# Patient Record
Sex: Female | Born: 1950
Health system: Southern US, Community
[De-identification: ages and names within clinical notes are randomized; demographics above are authoritative.]

## PROBLEM LIST (undated history)

## (undated) DIAGNOSIS — I639 Cerebral infarction, unspecified: Secondary | ICD-10-CM

## (undated) DIAGNOSIS — F419 Anxiety disorder, unspecified: Secondary | ICD-10-CM

## (undated) DIAGNOSIS — E119 Type 2 diabetes mellitus without complications: Secondary | ICD-10-CM

## (undated) DIAGNOSIS — E785 Hyperlipidemia, unspecified: Secondary | ICD-10-CM

## (undated) DIAGNOSIS — I10 Essential (primary) hypertension: Secondary | ICD-10-CM

## (undated) HISTORY — DX: Essential (primary) hypertension: I10

## (undated) HISTORY — DX: Anxiety disorder, unspecified: F41.9

## (undated) HISTORY — DX: Cerebral infarction, unspecified: I63.9

## (undated) HISTORY — DX: Hyperlipidemia, unspecified: E78.5

## (undated) HISTORY — PX: TUBAL LIGATION: SHX77

## (undated) HISTORY — DX: Type 2 diabetes mellitus without complications: E11.9

---

## 2009-09-22 ENCOUNTER — Ambulatory Visit: Payer: Self-pay | Admitting: Family Medicine

## 2009-09-22 DIAGNOSIS — I10 Essential (primary) hypertension: Secondary | ICD-10-CM | POA: Insufficient documentation

## 2009-09-22 DIAGNOSIS — E1165 Type 2 diabetes mellitus with hyperglycemia: Secondary | ICD-10-CM | POA: Insufficient documentation

## 2009-09-22 DIAGNOSIS — J019 Acute sinusitis, unspecified: Secondary | ICD-10-CM | POA: Insufficient documentation

## 2010-04-01 NOTE — Assessment & Plan Note (Signed)
Summary: SINUS INFECTION/JBB   Vital Signs:  Patient Profile:   60 Years Old Female CC:      sinus congestion Height:     61.5 inches Weight:      159 pounds BMI:     29.66 O2 Sat:      97 % O2 treatment:    Room Air Pulse rate:   86 / minute Resp:     16 per minute BP sitting:   152 / 87  (left arm)  Pt. in pain?   no  Vitals Entered By: Adella Hare LPN (2009/09/24 11:43 AM)                   Prior Medication List:  No prior medications documented  Current Allergies (reviewed today): ! PCNHistory of Present Illness Chief Complaint: sinus congestion History of Present Illness: sinus congestion with drainage, productive cough, sinus pressure denies fever, chills, or body aches.   Patient has had congestion and coughing for several days.  she has had post nasl drainage going down the back of her throat. She has had bronchiti before.  Current Problems: ACUTE SINUSITIS, UNSPECIFIED (ICD-461.9) HYPERTENSION (ICD-401.9) DIABETES MELLITUS, TYPE II (ICD-250.00)   Current Meds AVANDIA 2 MG TABS (ROSIGLITAZONE MALEATE) one tab by mouth once daily ALTACE 10 MG CAPS (RAMIPRIL) one cap by mouth once daily ZITHROMAX Z-PAK 250 MG  TABS (AZITHROMYCIN) Use as directed MUCINEX D [phone] MG XR12H-TAB (PSEUDOEPHEDRINE-GUAIFENESIN) sig 1 twice a day  REVIEW OF SYSTEMS Constitutional Symptoms       Complains of fever, night sweats, and fatigue.     Denies chills.  Eyes       Denies change in vision, eye pain, eye discharge, and glasses. Ear/Nose/Throat/Mouth       Denies hearing loss/aids, change in hearing, ear pain, and ear discharge.  Respiratory       Complains of productive cough, wheezing, and shortness of breath.      Denies dry cough, asthma, bronchitis, and emphysema/COPD.  Cardiovascular       Denies murmurs and chest pain.    Gastrointestinal       Complains of diarrhea.      Denies stomach pain, nausea/vomiting, and constipation. Genitourniary       Denies  painful urination. Neurological       Denies headaches, numbness, and tngling. Musculoskeletal       Denies muscle pain, joint pain, and joint stiffness.   Psych       Denies anxiety/stress.  Past History:  Family History: Last updated: Sep 24, 2009 mother deceased- htn father deceased- lung cancer twin sister and older sister- htn brother deceased- suicide  Social History: Last updated: 09/24/2009 Employed- full time- custodian Divorced 3 grown children Quit smoking 10 years ago Alcohol use-no Drug use-no Regular exercise-no  Past Medical History: Diabetes mellitus, type II Hypertension  Past Surgical History: Tubal ligation  Family History: Reviewed history and no changes required. mother deceased- htn father deceased- lung cancer twin sister and older sister- htn brother deceased- suicide  Social History: Reviewed history and no changes required. 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 One Tab By  Mouth Once Daily 2)  Altace 10 Mg Caps (Ramipril) .... One Cap By Mouth Once Daily  Allergies (verified): 1)  ! Pcn  Physical Exam General appearance: well developed, well nourished, no acute distress Head: normocephalic, atraumatic Ears: normal, no lesions or deformities Nasal: pale, boggy, swollen nasal turbinates Oral/Pharynx: pharyngeal erythema without exudate, uvula midline without deviation Neck: neck supple,  trachea midline, no masses Chest/Lungs: no rales, wheezes, or rhonchi bilateral, breath sounds equal without effort Heart: regular rate and  rhythm, no murmur Skin: no obvious rashes or  lesions MSE: oriented to time, place, and person Assessment New Problems: ACUTE SINUSITIS, UNSPECIFIED (ICD-461.9) HYPERTENSION (ICD-401.9) DIABETES MELLITUS, TYPE II (ICD-250.00)  sinusistis  Patient Education: Patient and/or caregiver instructed in the following: rest fluids and Tylenol.  Plan New Medications/Changes: MUCINEX D [phone] MG XR12H-TAB (PSEUDOEPHEDRINE-GUAIFENESIN) sig 1 twice a day  #30 x 0, 09/22/2009, Hassan Rowan MD ZITHROMAX Z-PAK 250 MG  TABS (AZITHROMYCIN) Use as directed  #1 x 0, 09/22/2009, Hassan Rowan MD  New Orders: New Patient Level III [phone] Follow Up: Follow up in 2-3 days if no improvement, Follow up on an as needed basis, Follow up with Primary Physician  The patient and/or caregiver has been counseled thoroughly with regard to medications prescribed including dosage, schedule, interactions, rationale for use, and possible side effects and they verbalize understanding.  Diagnoses and expected course of recovery discussed and will return if not improved as expected or if the condition worsens. Patient and/or caregiver verbalized understanding.  Prescriptions: MUCINEX D [phone] MG XR12H-TAB (PSEUDOEPHEDRINE-GUAIFENESIN) sig 1 twice a day  #30 x 0   Entered and Authorized by:   Hassan Rowan MD   Signed by:   Hassan Rowan MD on 09/22/2009   Method used:   Print then Give to Patient   RxID:   [id number] ZITHROMAX Z-PAK 250 MG  TABS (AZITHROMYCIN) Use as directed  #1 x 0   Entered and Authorized by:   Hassan Rowan MD   Signed by:   Hassan Rowan MD on 09/22/2009   Method used:   Print then Give to Patient   RxID:   [id number]   Patient Instructions: 1)  Please schedule a follow-up appointment as needed. 2)  Please schedule an appointment with your primary doctor in 1-2 weeks if not better.  3)  Acute sinusitis symptoms for less than 10 days are not helped by antibiotics.Use warm moist compresses, and over the counter decongestants ( only as  directed). Call if no improvement in 5-7 days, sooner if increasing pain, fever, or new symptoms. 4)  Take your antibiotic as prescribed until ALL of it is gone, but stop if you develop a rash or swelling and contact our office as soon as possible.  Orders Added: 1)  New Patient Level III [13244]

## 2010-09-15 ENCOUNTER — Inpatient Hospital Stay: Payer: Self-pay | Admitting: Internal Medicine

## 2010-09-24 ENCOUNTER — Encounter: Payer: Self-pay | Admitting: Family Medicine

## 2010-10-01 ENCOUNTER — Encounter: Payer: Self-pay | Admitting: Family Medicine

## 2010-10-23 ENCOUNTER — Inpatient Hospital Stay: Payer: Self-pay | Admitting: Vascular Surgery

## 2010-10-27 LAB — PATHOLOGY REPORT

## 2010-11-01 ENCOUNTER — Encounter: Payer: Self-pay | Admitting: Family Medicine

## 2010-11-17 ENCOUNTER — Ambulatory Visit: Payer: Self-pay | Admitting: Internal Medicine

## 2010-12-01 ENCOUNTER — Encounter: Payer: Self-pay | Admitting: Family Medicine

## 2011-01-01 ENCOUNTER — Encounter: Payer: Self-pay | Admitting: Family Medicine

## 2011-01-31 ENCOUNTER — Encounter: Payer: Self-pay | Admitting: Family Medicine

## 2011-03-03 ENCOUNTER — Encounter: Payer: Self-pay | Admitting: Family Medicine

## 2012-08-30 ENCOUNTER — Ambulatory Visit: Payer: Self-pay | Admitting: Adult Health

## 2012-08-30 DIAGNOSIS — I499 Cardiac arrhythmia, unspecified: Secondary | ICD-10-CM

## 2013-08-17 ENCOUNTER — Ambulatory Visit: Payer: Self-pay

## 2013-10-03 ENCOUNTER — Ambulatory Visit: Payer: Self-pay

## 2013-10-03 LAB — HM MAMMOGRAPHY

## 2014-09-10 ENCOUNTER — Other Ambulatory Visit: Payer: Self-pay | Admitting: Unknown Physician Specialty

## 2014-09-10 NOTE — Telephone Encounter (Signed)
Last creatinine checked in [facility]; Rxs approved

## 2014-10-09 DIAGNOSIS — I129 Hypertensive chronic kidney disease with stage 1 through stage 4 chronic kidney disease, or unspecified chronic kidney disease: Secondary | ICD-10-CM | POA: Insufficient documentation

## 2014-10-09 DIAGNOSIS — I639 Cerebral infarction, unspecified: Secondary | ICD-10-CM | POA: Insufficient documentation

## 2014-10-09 DIAGNOSIS — F419 Anxiety disorder, unspecified: Secondary | ICD-10-CM | POA: Insufficient documentation

## 2014-10-09 DIAGNOSIS — F32A Depression, unspecified: Secondary | ICD-10-CM | POA: Insufficient documentation

## 2014-10-09 DIAGNOSIS — F329 Major depressive disorder, single episode, unspecified: Secondary | ICD-10-CM

## 2014-10-09 DIAGNOSIS — E785 Hyperlipidemia, unspecified: Secondary | ICD-10-CM

## 2014-10-10 ENCOUNTER — Encounter: Payer: Self-pay | Admitting: Unknown Physician Specialty

## 2014-10-10 ENCOUNTER — Ambulatory Visit (INDEPENDENT_AMBULATORY_CARE_PROVIDER_SITE_OTHER): Payer: Commercial Managed Care - HMO | Admitting: Unknown Physician Specialty

## 2014-10-10 VITALS — BP 98/64 | HR 94 | Temp 98.5°F | Ht 61.2 in | Wt 136.6 lb

## 2014-10-10 DIAGNOSIS — I10 Essential (primary) hypertension: Secondary | ICD-10-CM

## 2014-10-10 DIAGNOSIS — M5432 Sciatica, left side: Secondary | ICD-10-CM | POA: Diagnosis not present

## 2014-10-10 DIAGNOSIS — E119 Type 2 diabetes mellitus without complications: Secondary | ICD-10-CM | POA: Diagnosis not present

## 2014-10-10 DIAGNOSIS — E785 Hyperlipidemia, unspecified: Secondary | ICD-10-CM

## 2014-10-10 LAB — LIPID PANEL PICCOLO, WAIVED
Chol/HDL Ratio Piccolo,Waive: 3.9 mg/dL
Cholesterol Piccolo, Waived: 156 mg/dL (ref <200)
HDL Chol Piccolo, Waived: 40 mg/dL — ABNORMAL LOW (ref >59)
LDL Chol Calc Piccolo Waived: 71 mg/dL (ref <100)
TRIGLYCERIDES PICCOLO,WAIVED: 221 mg/dL — AB (ref <150)
VLDL CHOL CALC PICCOLO,WAIVE: 44 mg/dL — AB (ref <30)

## 2014-10-10 LAB — BAYER DCA HB A1C WAIVED: HB A1C (BAYER DCA - WAIVED): 6.2 % (ref <7.0)

## 2014-10-10 MED ORDER — GABAPENTIN 400 MG PO CAPS: 400.0000 mg | ORAL_CAPSULE | Freq: Three times a day (TID) | ORAL | Status: DC

## 2014-10-10 NOTE — Assessment & Plan Note (Signed)
Hgb A1C is 6.2%.  Continue present medications 

## 2014-10-10 NOTE — Progress Notes (Signed)
BP 98/64 mmHg  Pulse 94  Temp(Src) 98.5 F (36.9 C)  Ht 5' 1.2" (1.554 m)  Wt 136 lb 9.6 oz (61.961 kg)  BMI 25.66 kg/m2  SpO2 94%  LMP  (LMP Unknown)   Subjective:    Patient ID: Erin Hamilton, female    DOB: Oct 16, 1950, 64 y.o.   MRN: [id number]  HPI: Erin Hamilton is a 64 y.o. female  Chief Complaint  Patient presents with  . Diabetes  . Hypertension  . Hyperlipidemia  . Depression    pt states she quit taking the citalopram  . Immunizations    pt is requesting shingles vaccine   Diabetes She presents for her follow-up diabetic visit. She has type 2 diabetes mellitus. Her disease course has been stable. There are no hypoglycemic associated symptoms. Pertinent negatives for hypoglycemia include no headaches. Pertinent negatives for diabetes include no blurred vision, no chest pain, no fatigue, no polyphagia, no polyuria and no weight loss. There are no hypoglycemic complications. Symptoms are stable. There are no diabetic complications. Her weight is stable. She is following a generally healthy diet. She has not had a previous visit with a dietitian. She participates in exercise intermittently. She monitors blood glucose at home 1-2 x per week. There is no change in her home blood glucose trend. Her breakfast blood glucose range is generally 90-110 mg/dl. Eye exam is not current.  Hypertension This is a chronic problem. The problem is controlled. Pertinent negatives include no blurred vision, chest pain or headaches. There are no compliance problems.  There is no history of chronic renal disease.  Hyperlipidemia This is a chronic problem. The problem is controlled. Exacerbating diseases include liver disease. She has no history of chronic renal disease, diabetes, hypothyroidism, obesity or nephrotic syndrome. There are no known factors aggravating her hyperlipidemia. Pertinent negatives include no chest pain or myalgias. There are no compliance problems.   Depression  This is a chronic ("I don't take that medicine") problem.  The problem has been gradually improving since onset.  Associated symptoms include no decreased concentration, no fatigue, no helplessness, no hopelessness, does not have insomnia, not irritable, no restlessness, no decreased interest, no appetite change, no body aches, no myalgias, no headaches, no indigestion, not sad and no suicidal ideas.     The symptoms are aggravated by nothing.   Pertinent negatives include no hypothyroidism.  Radicular back pain Gabapentin is helping but finds left hip hurts and gets numb.  Would like to increase Gabapentin  Relevant past medical, surgical, family and social history reviewed and updated as indicated. Interim medical history since our last visit reviewed. Allergies and medications reviewed and updated.  Review of Systems  Constitutional: Negative for weight loss, appetite change and fatigue.  Eyes: Negative for blurred vision.  Cardiovascular: Negative for chest pain.  Endocrine: Negative for polyphagia and polyuria.  Musculoskeletal: Negative for myalgias.  Neurological: Negative for headaches.  Psychiatric/Behavioral: Positive for depression. Negative for suicidal ideas and decreased concentration. The patient does not have insomnia.     Per HPI unless specifically indicated above     Objective:    BP 98/64 mmHg  Pulse 94  Temp(Src) 98.5 F (36.9 C)  Ht 5' 1.2" (1.554 m)  Wt 136 lb 9.6 oz (61.961 kg)  BMI 25.66 kg/m2  SpO2 94%  LMP  (LMP Unknown)  Wt Readings from Last 3 Encounters:  10/10/14 136 lb 9.6 oz (61.961 kg)  03/16/14 137 lb (62.143 kg)  09/22/09 159 lb (  72.122 kg)    Physical Exam  Constitutional: She is oriented to person, place, and time. She appears well-developed and well-nourished. She is not irritable. No distress.  HENT:  Head: Normocephalic and atraumatic.  Eyes: Conjunctivae and lids are normal. Right eye exhibits no discharge. Left eye exhibits no  discharge. No scleral icterus.  Cardiovascular: Normal rate, regular rhythm and normal heart sounds.   Pulmonary/Chest: Effort normal. No respiratory distress.  Abdominal: Normal appearance. There is no splenomegaly or hepatomegaly.  Musculoskeletal: Normal range of motion.  Neurological: She is alert and oriented to person, place, and time.  Skin: Skin is intact. No rash noted. No pallor.  Psychiatric: She has a normal mood and affect. Her behavior is normal. Judgment and thought content normal.  Nursing note and vitals reviewed.   Results for orders placed or performed in visit on 10/09/14  HM MAMMOGRAPHY  Result Value Ref Range   HM Mammogram from PP       Assessment & Plan:   Problem List Items Addressed This Visit      Unprioritized   Hyperlipidemia    LDL is 71.  Continue present medications.        Relevant Orders   Lipid Panel Piccolo, Waived   Diabetes mellitus without complication - Primary    Hgb A1C is 6.2.  Continue present medications      Relevant Orders   Bayer DCA Hb A1c Waived   Lipid Panel Piccolo, Waived   Essential hypertension, benign    Stable.  Continue present medications      Relevant Orders   Comprehensive metabolic panel   Lipid Panel Piccolo, Waived   Back pain with left-sided sciatica    Increase Gabapentin 400 mg TID      Relevant Medications   gabapentin (NEURONTIN) 400 MG capsule      Rx written to get a shingles vaccine  Follow up plan: Return in about 6 months (around 04/12/2015).

## 2014-10-10 NOTE — Assessment & Plan Note (Signed)
Increase Gabapentin 400 mg TID

## 2014-10-10 NOTE — Assessment & Plan Note (Signed)
Stable.  Continue present medications 

## 2014-10-10 NOTE — Assessment & Plan Note (Signed)
LDL is 71.  Continue present medications.

## 2014-10-11 LAB — COMPREHENSIVE METABOLIC PANEL
ALT: 12 IU/L (ref 0–32)
AST: 12 IU/L (ref 0–40)
Albumin/Globulin Ratio: 1.3 (ref 1.1–2.5)
Albumin: 3.9 g/dL (ref 3.6–4.8)
Alkaline Phosphatase: 101 IU/L (ref 39–117)
BUN/Creatinine Ratio: 13 (ref 11–26)
BUN: 13 mg/dL (ref 8–27)
Bilirubin Total: 0.3 mg/dL (ref 0.0–1.2)
CHLORIDE: 103 mmol/L (ref 97–108)
CO2: 23 mmol/L (ref 18–29)
Calcium: 9.5 mg/dL (ref 8.7–10.3)
Creatinine, Ser: 0.97 mg/dL (ref 0.57–1.00)
GFR calc Af Amer: 71 mL/min/{1.73_m2} (ref >59)
GFR calc non Af Amer: 62 mL/min/{1.73_m2} (ref >59)
GLUCOSE: 172 mg/dL — AB (ref 65–99)
Globulin, Total: 2.9 g/dL (ref 1.5–4.5)
Potassium: 3.8 mmol/L (ref 3.5–5.2)
SODIUM: 144 mmol/L (ref 134–144)
Total Protein: 6.8 g/dL (ref 6.0–8.5)

## 2014-11-07 ENCOUNTER — Other Ambulatory Visit: Payer: Self-pay | Admitting: Family Medicine

## 2014-11-07 NOTE — Telephone Encounter (Signed)
I am not her primary provider

## 2014-11-07 NOTE — Telephone Encounter (Signed)
Routing to provider  

## 2014-11-07 NOTE — Telephone Encounter (Signed)
Routing to correct provider

## 2014-11-29 ENCOUNTER — Other Ambulatory Visit: Payer: Self-pay | Admitting: Unknown Physician Specialty

## 2014-11-29 ENCOUNTER — Other Ambulatory Visit: Payer: Self-pay | Admitting: Family Medicine

## 2015-01-07 ENCOUNTER — Other Ambulatory Visit: Payer: Self-pay | Admitting: Unknown Physician Specialty

## 2015-03-06 ENCOUNTER — Encounter: Payer: Self-pay | Admitting: Unknown Physician Specialty

## 2015-03-06 ENCOUNTER — Ambulatory Visit (INDEPENDENT_AMBULATORY_CARE_PROVIDER_SITE_OTHER): Payer: Commercial Managed Care - HMO | Admitting: Unknown Physician Specialty

## 2015-03-06 VITALS — BP 137/80 | HR 89 | Temp 98.3°F | Ht 60.6 in | Wt 143.1 lb

## 2015-03-06 DIAGNOSIS — I639 Cerebral infarction, unspecified: Secondary | ICD-10-CM | POA: Diagnosis not present

## 2015-03-06 DIAGNOSIS — Z23 Encounter for immunization: Secondary | ICD-10-CM | POA: Diagnosis not present

## 2015-03-06 NOTE — Assessment & Plan Note (Signed)
Refer to OT for further assessment of your ability to drive Refer to [facility] care management.

## 2015-03-06 NOTE — Patient Instructions (Signed)
We need to refer you to a social worker through [facility] care management to discuss transportation options I need to refer you to occupational therapy to assess your ability to drive.
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
## 2015-03-19 ENCOUNTER — Other Ambulatory Visit: Payer: Self-pay | Admitting: Unknown Physician Specialty

## 2015-03-19 ENCOUNTER — Other Ambulatory Visit: Payer: Self-pay | Admitting: Family Medicine

## 2015-03-22 ENCOUNTER — Other Ambulatory Visit: Payer: Self-pay | Admitting: Unknown Physician Specialty

## 2015-04-17 ENCOUNTER — Encounter: Payer: Commercial Managed Care - HMO | Admitting: Unknown Physician Specialty

## 2015-04-25 ENCOUNTER — Other Ambulatory Visit: Payer: Self-pay | Admitting: *Deleted

## 2015-04-25 DIAGNOSIS — I639 Cerebral infarction, unspecified: Secondary | ICD-10-CM
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
## 2015-04-29 ENCOUNTER — Other Ambulatory Visit: Payer: Self-pay | Admitting: Unknown Physician Specialty

## 2015-05-09 ENCOUNTER — Other Ambulatory Visit: Payer: Self-pay | Admitting: *Deleted

## 2015-05-09 NOTE — Patient Outreach (Signed)
Spoke with pt, f/u on referral from [facility] telephonic RN CM.   HIPPA verified on pt.  Home visit scheduled for 3/16.  Address provided is  [address] [place], [place].     Zara Chess.   [facility] Care Management  [phone]

## 2015-05-16 ENCOUNTER — Other Ambulatory Visit: Payer: Self-pay | Admitting: *Deleted

## 2015-05-16 ENCOUNTER — Encounter: Payer: Self-pay | Admitting: *Deleted

## 2015-05-18 ENCOUNTER — Encounter: Payer: Self-pay | Admitting: *Deleted

## 2015-05-18 NOTE — Patient Outreach (Signed)
[facility]) Care Management   Home visit 05/16/15  Erin Hamilton 11-27-1950 [id number]  Erin Hamilton is an 65 y.o. female  Subjective:  Pt reports lives alone, son and spouse died last year, ex brother in law and step mother check on her.  Pt reports to see eye MD 3/22 and Erin Haddock NP  3/27.  Pt reports gets meals on wheels.  Pt reports left arm/leg weakness from past stroke.  Pt reports time is an issue with her, has to mark down all her appointments.     Objective:   Filed Vitals:   05/16/15 1613  BP: 128/80  Pulse: 83  Resp: 20    ROS  Physical Exam  Current Medications:  Reviewed with pt  Current Outpatient Prescriptions  Medication Sig Dispense Refill  . clopidogrel (PLAVIX) 75 MG tablet TAKE 1 TABLET EVERY DAY 90 tablet 1  . gabapentin (NEURONTIN) 400 MG capsule TAKE 1 CAPSULE (400 MG TOTAL) BY MOUTH 3 (THREE) TIMES DAILY. 270 capsule 3  . lisinopril (PRINIVIL,ZESTRIL) 10 MG tablet TAKE 1 TABLET (10 MG TOTAL) BY MOUTH DAILY. 90 tablet 0  . metFORMIN (GLUCOPHAGE) 500 MG tablet TAKE 1 TABLET TWICE DAILY BEFORE A MEAL 180 tablet 0  . simvastatin (ZOCOR) 20 MG tablet TAKE 1 TABLET EVERY DAY 90 tablet 1  . glucose blood test strip 1 each by Other route daily. Use as instructed     No current facility-administered medications for this visit.    Functional Status:   In your present state of health, do you have any difficulty performing the following activities: 05/16/2015  Hearing? N  Vision? N  Difficulty concentrating or making decisions? Y  Walking or climbing stairs? N  Dressing or bathing? N  Doing errands, shopping? N  Preparing Food and eating ? N  Using the Toilet? N  In the past six months, have you accidently leaked urine? N  Do you have problems with loss of bowel control? N  Managing your Medications? N  Managing your Finances? N  Housekeeping or managing your Housekeeping? N    Fall/Depression Screening:    PHQ 2/9 Scores  05/16/2015  PHQ - 2 Score 1    Assessment:   Lives alone, family close by, checks on pt.  Left hand/arm weakness, walks with a limb.  Receiving meals on wheels.  Pt managing own medications.  Drives herself to MD office visits.   DM-  Pt checked blood sugar during home visit, result was 113.    Plan:  Pt to f/u with Eye MD (routine exam) 3/22            Pt to f/u with Erin Haddock NP 3/27.            Pt to check blood sugars more often, record [facility] calendar provided)            Plan to inform Erin Hamilton of [facility] involvement.             Plan to continue to provide pt with community nurse case management services, next home visit 4/14.    THN CM Care Plan Problem One        Most Recent Value   Care Plan Problem One  Hx of CVA    Role Documenting the Problem One  Care Management Coordinator   Care Plan for Problem One  Active   THN CM Short Term Goal #1 (0-30 days)  Pt would be able to identify  s/s of stroke  in next 30 days    THN CM Short Term Goal #1 Start Date  05/16/15   Interventions for Short Term Goal #1  Reviewed with pt s/s of stroke.      THN CM Care Plan Problem Two        Most Recent Value   Care Plan Problem Two  DM- pt not monitoring sugars    Role Documenting the Problem Two  Care Management [place] for Problem Two  Active   Interventions for Problem Two Long Term Goal   Checked pt's glucometer for date accuracy, discussed with pt checking sugars often.    THN Long Term Goal (31-90) days  Pt would start back monitoring sugars for the  next 31 days    THN Long Term Goal Start Date  05/16/15     Erin Hamilton.   [place] Care Management  [phone]

## 2015-05-21 ENCOUNTER — Other Ambulatory Visit: Payer: Self-pay | Admitting: *Deleted

## 2015-05-21 NOTE — Patient Outreach (Signed)
Called pt to discuss driving evaluation needed.  Pt reports she went to the [facility], told them about her stroke and was told might need a knob on her steering wheel.  Pt states she does not know where to get that.  Pt reports she has not scheduled an appointment for driving evaluation.  Pt reports she is scheduled to see Eye MD tomorrow.  RN CM discussed with pt to call DMV inquire about what is needed for driving evaluation, call her back.   Zara Chess.   [place] Care Management  [phone]

## 2015-05-22 DIAGNOSIS — H524 Presbyopia: Secondary | ICD-10-CM | POA: Diagnosis not present

## 2015-05-22 DIAGNOSIS — Z0100 Encounter for examination of eyes and vision without abnormal findings: Secondary | ICD-10-CM | POA: Diagnosis not present

## 2015-05-22 DIAGNOSIS — H5210 Myopia, unspecified eye: Secondary | ICD-10-CM | POA: Diagnosis not present

## 2015-05-22 LAB — HM DIABETES EYE EXAM

## 2015-05-27 ENCOUNTER — Encounter: Payer: Commercial Managed Care - HMO | Admitting: Unknown Physician Specialty

## 2015-05-29 ENCOUNTER — Other Ambulatory Visit: Payer: Self-pay | Admitting: *Deleted

## 2015-05-29 ENCOUNTER — Telehealth: Payer: Self-pay | Admitting: Unknown Physician Specialty

## 2015-05-29 NOTE — Telephone Encounter (Signed)
Called and left patient a voicemail letting her know what the DMV said.
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
## 2015-05-29 NOTE — Patient Outreach (Signed)
Received a return phone call from pt to voice message left earlier.  RN CM f/u  with pt about driving evaluation- was told steering wheel knob can be purchased at local auto store, reasonable price.  Pt reports she is aware of that and ex husband can put it on, don't think she needs it.   Also discussed with pt  that view in Epic 03/06/15 office visit with [name] NP that she was  given a limited driving allowance for 3 months.  Pt reports she did not realize  There was a time limit.   Pt reports she was told she would need an OT assessment to which RN CM informed her [facility] PT  does not do driving evaluation, reason for [facility] referral.   Pt reports she was suppose to f/u with Malachy Mood NP 3/27, was to talk to her then about her driving but  was late so appointment was cancelled, not to see Malachy Mood NP until 5/5.    Discussed with pt calling MD office, discussed want [place] OT for left hand, see if driving allowance can be extended to which pt said she would do and call RN CM back.    Zara Chess.   [place] Care Management  [phone]

## 2015-05-29 NOTE — Patient Outreach (Signed)
Attempt made to contact pt, f/u on driving evaluation issues.   HIPPA compliant voice message left with contact number.  If no response, will call again.   Zara Chess.   [place] Care Management  [phone]

## 2015-05-29 NOTE — Telephone Encounter (Signed)
Pt came in the office, wants to know if she can have her driving extended by another 3 months. Please call pt to notify. Thanks.

## 2015-05-29 NOTE — Telephone Encounter (Signed)
Please let pt know

## 2015-05-29 NOTE — Telephone Encounter (Signed)
Routing to provider  

## 2015-05-29 NOTE — Telephone Encounter (Signed)
Admin staff contacted the [facility] in [place], [place] stated pt's license is valid with no known restrictions.

## 2015-06-14 ENCOUNTER — Ambulatory Visit: Payer: Self-pay | Admitting: *Deleted

## 2015-06-19 ENCOUNTER — Other Ambulatory Visit: Payer: Self-pay | Admitting: *Deleted

## 2015-06-19 ENCOUNTER — Encounter: Payer: Self-pay | Admitting: *Deleted

## 2015-06-19 NOTE — Patient Outreach (Signed)
[facility]) Care Management   06/19/2015  CAMIKA MARSICO 1950-10-30 [id number]  YOBANA CULLITON is an 65 y.o. female  Subjective:  Pt reports been doing good, compliant with medications.  Pt reports to f/u with [name] NP 5/4.   Pt reports her brother in law is recovering from a stroke, giving her tips to get  Stronger.   Objective:   Filed Vitals:   06/19/15 1042  BP: 140/82  Pulse: 81  Resp: 12    ROS  Physical Exam  Constitutional: She is oriented to person, place, and time. She appears well-developed and well-nourished.  Cardiovascular: Normal rate and regular rhythm.   Respiratory: Effort normal and breath sounds normal.  Musculoskeletal: Normal range of motion.  Neurological: She is alert and oriented to person, place, and time.  Skin: Skin is warm and dry.  Psychiatric: She has a normal mood and affect. Her behavior is normal. Judgment and thought content normal.    Encounter Medications:  Reviewed with pt  Outpatient Encounter Prescriptions as of 06/19/2015  Medication Sig Note  . gabapentin (NEURONTIN) 400 MG capsule TAKE 1 CAPSULE (400 MG TOTAL) BY MOUTH 3 (THREE) TIMES DAILY. 05/16/2015: Pt reports taking once a day   . glucose blood test strip 1 each by Other route daily. Use as instructed   . lisinopril (PRINIVIL,ZESTRIL) 10 MG tablet TAKE 1 TABLET (10 MG TOTAL) BY MOUTH DAILY.   . metFORMIN (GLUCOPHAGE) 500 MG tablet TAKE 1 TABLET TWICE DAILY BEFORE A MEAL   . simvastatin (ZOCOR) 20 MG tablet TAKE 1 TABLET EVERY DAY   . clopidogrel (PLAVIX) 75 MG tablet TAKE 1 TABLET EVERY DAY    No facility-administered encounter medications on file as of 06/19/2015.    Functional Status:   In your present state of health, do you have any difficulty performing the following activities: 05/16/2015  Hearing? N  Vision? N  Difficulty concentrating or making decisions? Y  Walking or climbing stairs? N  Dressing or bathing? N  Doing errands, shopping? N   Preparing Food and eating ? N  Using the Toilet? N  In the past six months, have you accidently leaked urine? N  Do you have problems with loss of bowel control? N  Managing your Medications? N  Managing your Finances? N  Housekeeping or managing your Housekeeping? N    Fall/Depression Screening:    PHQ 2/9 Scores 05/16/2015  PHQ - 2 Score 1    Assessment:   Pleasant 65 year old woman, lives by herself, brother in law calls her daily.                            DM- pt not checking sugars, had pt check today during home visit - result 113 (before a meal)                          HTN- BP today 140/82 (prior to taking medications).  Pt reports had ham during the holidays.   Plan:    As discussed with pt, plan to discharge from RN CM services- some goals met, no further case management needs.              Plan to inform [name] NP of discharge- in basket case closure letter.             As discussed with pt, plan to have case  closure letter mailed to her.              Plan to inform [facility] care management assistant to close case.    THN CM Care Plan Problem One        Most Recent Value   Care Plan Problem One  Hx of CVA    Role Documenting the Problem One  Care Management Coordinator   Care Plan for Problem One  Active   THN CM Short Term Goal #1 (0-30 days)  Pt would be able to identify  s/s of stroke  in next 30 days    THN CM Short Term Goal #1 Start Date  05/16/15   [facility] CM Short Term Goal #1 Met Date  06/19/15 [met- pt was able to name 3 s/s of stroke. ]   Interventions for Short Term Goal #1  Reviewed with pt s/s of stroke.      THN CM Care Plan Problem Two        Most Recent Value   Care Plan Problem Two  DM- pt not monitoring sugars    Role Documenting the Problem Two  Care Management Coordinator   Care Plan for Problem Two  Active   Interventions for Problem Two Long Term Goal   had pt check sugar during home visit- result 116    THN Long Term Goal (31-90) days  Pt  would start back monitoring sugars for the  next 31 days    THN Long Term Goal Start Date  05/16/15   THN Long Term Goal Met Date  -- [not met- pt not checking on a regular basis, today [place] M.   [place] Care Management  [phone]

## 2015-07-05 ENCOUNTER — Ambulatory Visit (INDEPENDENT_AMBULATORY_CARE_PROVIDER_SITE_OTHER): Payer: Commercial Managed Care - HMO | Admitting: Unknown Physician Specialty

## 2015-07-05 ENCOUNTER — Encounter: Payer: Self-pay | Admitting: Unknown Physician Specialty

## 2015-07-05 VITALS — BP 130/76 | HR 86 | Temp 98.3°F | Ht 61.1 in | Wt 141.2 lb

## 2015-07-05 DIAGNOSIS — E2839 Other primary ovarian failure: Secondary | ICD-10-CM | POA: Diagnosis not present

## 2015-07-05 DIAGNOSIS — I10 Essential (primary) hypertension: Secondary | ICD-10-CM

## 2015-07-05 DIAGNOSIS — E785 Hyperlipidemia, unspecified: Secondary | ICD-10-CM

## 2015-07-05 DIAGNOSIS — Z0000 Encounter for general adult medical examination without abnormal findings: Secondary | ICD-10-CM | POA: Diagnosis not present

## 2015-07-05 DIAGNOSIS — I639 Cerebral infarction, unspecified: Secondary | ICD-10-CM | POA: Diagnosis not present

## 2015-07-05 DIAGNOSIS — E119 Type 2 diabetes mellitus without complications: Secondary | ICD-10-CM

## 2015-07-05 DIAGNOSIS — Z23 Encounter for immunization: Secondary | ICD-10-CM

## 2015-07-05 LAB — MICROALBUMIN, URINE WAIVED
CREATININE, URINE WAIVED: 50 mg/dL (ref 10–300)
Microalb, Ur Waived: 30 mg/L — ABNORMAL HIGH (ref 0–19)

## 2015-07-05 LAB — BAYER DCA HB A1C WAIVED: HB A1C (BAYER DCA - WAIVED): 6.9 % (ref <7.0)

## 2015-07-05 NOTE — Patient Instructions (Addendum)
Pneumococcal Conjugate Vaccine (PCV13)  1. Why get vaccinated? Vaccination can protect both children and adults from pneumococcal disease. Pneumococcal disease is caused by bacteria that can spread from person to person through close contact. It can cause ear infections, and it can also lead to more serious infections of the:  Lungs (pneumonia),  Blood (bacteremia), and  Covering of the brain and spinal cord (meningitis). Pneumococcal pneumonia is most common among adults. Pneumococcal meningitis can cause deafness and brain damage, and it kills about 1 child in 10 who get it. Anyone can get pneumococcal disease, but children under 28 years of age and adults 43 years and older, people with certain medical conditions, and cigarette smokers are at the highest risk. Before there was a vaccine, the [place] saw:  more than 700 cases of meningitis,  about 13,000 blood infections,  about 5 million ear infections, and  about 200 deaths in children under 5 each year from pneumococcal disease. Since vaccine became available, severe pneumococcal disease in these children has fallen by 88%. About 18,000 older adults die of pneumococcal disease each year in the [place]. Treatment of pneumococcal infections with penicillin and other drugs is not as effective as it used to be, because some strains of the disease have become resistant to these drugs. This makes prevention of the disease, through vaccination, even more important. 2. PCV13 vaccine Pneumococcal conjugate vaccine (called PCV13) protects against 13 types of pneumococcal bacteria. PCV13 is routinely given to children at 2, 4, 6, and 65-74 months of age. It is also recommended for children and adults 70 to 70 years of age with certain health conditions, and for all adults 64 years of age and older. Your doctor can give you details. 3. Some people should not get this vaccine Anyone who has ever had a life-threatening allergic reaction  to a dose of this vaccine, to an earlier pneumococcal vaccine called PCV7, or to any vaccine containing diphtheria toxoid (for example, DTaP), should not get PCV13. Anyone with a severe allergy to any component of PCV13 should not get the vaccine. Tell your doctor if the person being vaccinated has any severe allergies. If the person scheduled for vaccination is not feeling well, your healthcare provider might decide to reschedule the shot on another day. 4. Risks of a vaccine reaction With any medicine, including vaccines, there is a chance of reactions. These are usually mild and go away on their own, but serious reactions are also possible. Problems reported following PCV13 varied by age and dose in the series. The most common problems reported among children were:  About half became drowsy after the shot, had a temporary loss of appetite, or had redness or tenderness where the shot was given.  About 1 out of 3 had swelling where the shot was given.  About 1 out of 3 had a mild fever, and about 1 in 20 had a fever over 102.55F.  Up to about 8 out of 10 became fussy or irritable. Adults have reported pain, redness, and swelling where the shot was given; also mild fever, fatigue, headache, chills, or muscle pain. Young children who get PCV13 along with inactivated flu vaccine at the same time may be at increased risk for seizures caused by fever. Ask your doctor for more information. Problems that could happen after any vaccine:  People sometimes faint after a medical procedure, including vaccination. Sitting or lying down for about 15 minutes can help prevent fainting, and injuries caused by a fall.  Tell your doctor if you feel dizzy, or have vision changes or ringing in the ears.  Some older children and adults get severe pain in the shoulder and have difficulty moving the arm where a shot was given. This happens very rarely.  Any medication can cause a severe allergic reaction. Such  reactions from a vaccine are very rare, estimated at about 1 in a million doses, and would happen within a few minutes to a few hours after the vaccination. As with any medicine, there is a very small chance of a vaccine causing a serious injury or death. The safety of vaccines is always being monitored. For more information, visit: http://www.aguilar.org/ 5. What if there is a serious reaction? What should I look for?  Look for anything that concerns you, such as signs of a severe allergic reaction, very high fever, or unusual behavior. Signs of a severe allergic reaction can include hives, swelling of the face and throat, difficulty breathing, a fast heartbeat, dizziness, and weakness-usually within a few minutes to a few hours after the vaccination. What should I do?  If you think it is a severe allergic reaction or other emergency that can't wait, call 9-1-1 or get the person to the nearest hospital. Otherwise, call your doctor. Reactions should be reported to the Vaccine Adverse Event Reporting System (VAERS). Your doctor should file this report, or you can do it yourself through the VAERS web site at www.vaers.SamedayNews.es, or by calling (443)867-5815. VAERS does not give medical advice. 6. The National Vaccine Injury Compensation Program The [employer] Vaccine Injury Compensation Program (VICP) is a federal program that was created to compensate people who may have been injured by certain vaccines. Persons who believe they may have been injured by a vaccine can learn about the program and about filing a claim by calling 407 500 7675 or visiting the [place] website at GoldCloset.com.ee. There is a time limit to file a claim for compensation. 7. How can I learn more?  Ask your healthcare provider. He or she can give you the vaccine package insert or suggest other sources of information.  Call your local or state health department.  Contact the Centers for Disease Control and  Prevention (CDC):  Call (682)286-8410 (1-800-CDC-INFO) or  Visit CDC's website at http://hunter.com/ Vaccine Information Statement PCV13 Vaccine (01/04/2014)   This information is not intended to replace advice given to you by your health care provider. Make sure you discuss any questions you have with your health care provider.   Please do call to schedule your bone density the number to schedule one at either [facility] or [facility] is [phone]

## 2015-07-05 NOTE — Progress Notes (Signed)
BP 130/76 mmHg  Pulse 86  Temp(Src) 98.3 F (36.8 C)  Ht 5' 1.1" (1.552 m)  Wt 141 lb 3.2 oz (64.048 kg)  BMI 26.59 kg/m2  SpO2 96%  LMP  (LMP Unknown)   Subjective:    Patient ID: Erin Hamilton, female    DOB: 1950-09-19, 65 y.o.   MRN: [id number]  HPI: Erin Hamilton is a 65 y.o. female  Chief Complaint  Patient presents with  . Medicare Wellness   Social History   Social History  . Marital Status: Married    Spouse Name: N/A  . Number of Children: N/A  . Years of Education: N/A   Occupational History  . Not on file.   Social History Main Topics  . Smoking status: Former Research scientist (life sciences)  . Smokeless tobacco: Never Used  . Alcohol Use: No  . Drug Use: No  . Sexual Activity: No   Other Topics Concern  . Not on file   Social History Narrative   Past Surgical History  Procedure Laterality Date  . Tubal ligation     Past Medical History  Diagnosis Date  . Anxiety   . Diabetes mellitus without complication ([place])   . CVA (cerebral infarction)   . Hypertension   . Hyperlipidemia   . Stroke [facility])    Diabetes:  Using medications without difficulties No hypoglycemic episodes No hyperglycemic episodes Feet problems: none Blood Sugars averaging:not checking  Hypertension:  Using medications without difficulty  Using medication without problems or lightheadedness No chest pain with exertion or shortness of breath No Edema Average home BPs:Not checking  Elevated Cholesterol: Using medications without problems No Muscle aches Exercise: walks a lot  Although pt doesn't appear to always follow instructions, mini-cog was negative  Relevant past medical, surgical, family and social history reviewed and updated as indicated. Interim medical history since our last visit reviewed. Allergies and medications reviewed and updated.  Review of Systems  Constitutional: Negative.   HENT: Negative.   Eyes: Negative.   Respiratory: Negative.   Cardiovascular:  Negative.   Gastrointestinal: Negative.   Endocrine: Negative.   Genitourinary: Negative.   Musculoskeletal: Negative.   Skin: Negative.   Allergic/Immunologic: Negative.   Neurological: Negative.   Hematological: Negative.   Psychiatric/Behavioral: Negative.     Per HPI unless specifically indicated above     Objective:    BP 130/76 mmHg  Pulse 86  Temp(Src) 98.3 F (36.8 C)  Ht 5' 1.1" (1.552 m)  Wt 141 lb 3.2 oz (64.048 kg)  BMI 26.59 kg/m2  SpO2 96%  LMP  (LMP Unknown)  Wt Readings from Last 3 Encounters:  07/05/15 141 lb 3.2 oz (64.048 kg)  06/19/15 138 lb (62.596 kg)  05/16/15 143 lb (64.864 kg)    Physical Exam  Constitutional: She is oriented to person, place, and time. She appears well-developed and well-nourished.  HENT:  Head: Normocephalic and atraumatic.  Eyes: Pupils are equal, round, and reactive to light. Right eye exhibits no discharge. Left eye exhibits no discharge. No scleral icterus.  Neck: Normal range of motion. Neck supple. Carotid bruit is not present. No thyromegaly present.  Cardiovascular: Normal rate, regular rhythm and normal heart sounds.  Exam reveals no gallop and no friction rub.   No murmur heard. Pulmonary/Chest: Effort normal and breath sounds normal. No respiratory distress. She has no wheezes. She has no rales.  Abdominal: Soft. Bowel sounds are normal. There is no tenderness. There is no rebound.  Genitourinary: Vagina normal and  uterus normal. No breast swelling, tenderness or discharge. Cervix exhibits no motion tenderness, no discharge and no friability. Right adnexum displays no mass, no tenderness and no fullness. Left adnexum displays no mass, no tenderness and no fullness.  Musculoskeletal: Normal range of motion.  Lymphadenopathy:    She has no cervical adenopathy.  Neurological: She is alert and oriented to person, place, and time.  Skin: Skin is warm, dry and intact. No rash noted.  Psychiatric: She has a normal mood and  affect. Her speech is normal and behavior is normal. Judgment and thought content normal. Cognition and memory are normal.     Results for orders placed or performed in visit on 05/23/15  HM DIABETES EYE EXAM  Result Value Ref Range   HM Diabetic Eye Exam No Retinopathy No Retinopathy      Assessment & Plan:   Problem List Items Addressed This Visit      Unprioritized   CVA (cerebral infarction)   Diabetes mellitus without complication ([place])   Relevant Orders   Comprehensive metabolic panel   Bayer DCA Hb A1c Waived   Essential hypertension, benign   Relevant Orders   Microalbumin, Urine Waived   Hyperlipidemia   Relevant Orders   Lipid Panel w/o Chol/HDL Ratio    Other Visit Diagnoses    Health care maintenance    -  Primary    Relevant Orders    Hepatitis C antibody    HIV antibody    Cologuard    Pap Lb, rfx HPV ASCU    Need for pneumococcal vaccination        Relevant Orders    Pneumococcal conjugate vaccine 13-valent IM (Completed)    Ovarian failure        Relevant Orders    DG Bone Density        Follow up plan: Return in about 6 months (around 01/05/2016).

## 2015-07-05 NOTE — Assessment & Plan Note (Signed)
Stable, continue present medications.   

## 2015-07-05 NOTE — Assessment & Plan Note (Signed)
Hgb A1C is 6.9.  Continue present medication

## 2015-07-05 NOTE — Assessment & Plan Note (Signed)
Await lipid panel 

## 2015-07-06 LAB — COMPREHENSIVE METABOLIC PANEL
ALBUMIN: 4.3 g/dL (ref 3.6–4.8)
ALK PHOS: 99 IU/L (ref 39–117)
ALT: 14 IU/L (ref 0–32)
AST: 15 IU/L (ref 0–40)
Albumin/Globulin Ratio: 1.7 (ref 1.2–2.2)
BUN/Creatinine Ratio: 12 (ref 12–28)
BUN: 12 mg/dL (ref 8–27)
Bilirubin Total: 0.2 mg/dL (ref 0.0–1.2)
CALCIUM: 9.7 mg/dL (ref 8.7–10.3)
CO2: 22 mmol/L (ref 18–29)
CREATININE: 1.01 mg/dL — AB (ref 0.57–1.00)
Chloride: 103 mmol/L (ref 96–106)
GFR calc Af Amer: 68 mL/min/{1.73_m2} (ref >59)
GFR, EST NON AFRICAN AMERICAN: 59 mL/min/{1.73_m2} — AB (ref >59)
GLOBULIN, TOTAL: 2.5 g/dL (ref 1.5–4.5)
GLUCOSE: 95 mg/dL (ref 65–99)
POTASSIUM: 4.7 mmol/L (ref 3.5–5.2)
Sodium: 143 mmol/L (ref 134–144)
Total Protein: 6.8 g/dL (ref 6.0–8.5)

## 2015-07-06 LAB — HEPATITIS C ANTIBODY

## 2015-07-06 LAB — LIPID PANEL W/O CHOL/HDL RATIO
Cholesterol, Total: 200 mg/dL — ABNORMAL HIGH (ref 100–199)
HDL: 44 mg/dL (ref >39)
LDL CALC: 105 mg/dL — AB (ref 0–99)
Triglycerides: 254 mg/dL — ABNORMAL HIGH (ref 0–149)
VLDL CHOLESTEROL CAL: 51 mg/dL — AB (ref 5–40)

## 2015-07-06 LAB — HIV ANTIBODY (ROUTINE TESTING W REFLEX): HIV Screen 4th Generation wRfx: NONREACTIVE

## 2015-07-09 LAB — PAP LB, RFX HPV ASCU: PAP Smear Comment: 0

## 2015-07-23 ENCOUNTER — Other Ambulatory Visit: Payer: Self-pay | Admitting: Unknown Physician Specialty

## 2015-07-25 DIAGNOSIS — Z1212 Encounter for screening for malignant neoplasm of rectum: Secondary | ICD-10-CM | POA: Diagnosis not present

## 2015-07-25 DIAGNOSIS — Z1211 Encounter for screening for malignant neoplasm of colon: Secondary | ICD-10-CM | POA: Diagnosis not present

## 2015-07-25 LAB — COLOGUARD

## 2015-07-26 LAB — COLOGUARD: Cologuard: NEGATIVE

## 2015-08-12 LAB — COLOGUARD: Cologuard: NEGATIVE

## 2015-09-20 ENCOUNTER — Other Ambulatory Visit: Payer: Self-pay | Admitting: Unknown Physician Specialty

## 2015-11-19 DIAGNOSIS — E119 Type 2 diabetes mellitus without complications: Secondary | ICD-10-CM | POA: Diagnosis not present

## 2015-11-19 DIAGNOSIS — I779 Disorder of arteries and arterioles, unspecified: Secondary | ICD-10-CM | POA: Insufficient documentation

## 2015-11-19 DIAGNOSIS — Z0000 Encounter for general adult medical examination without abnormal findings: Secondary | ICD-10-CM | POA: Insufficient documentation

## 2015-11-19 DIAGNOSIS — Z8673 Personal history of transient ischemic attack (TIA), and cerebral infarction without residual deficits: Secondary | ICD-10-CM | POA: Diagnosis not present

## 2015-11-19 DIAGNOSIS — E782 Mixed hyperlipidemia: Secondary | ICD-10-CM | POA: Diagnosis not present

## 2015-11-19 DIAGNOSIS — Z23 Encounter for immunization: Secondary | ICD-10-CM | POA: Diagnosis not present

## 2015-11-19 DIAGNOSIS — I10 Essential (primary) hypertension: Secondary | ICD-10-CM | POA: Diagnosis not present

## 2016-01-08 ENCOUNTER — Ambulatory Visit: Payer: Commercial Managed Care - HMO | Admitting: Unknown Physician Specialty

## 2016-03-24 DIAGNOSIS — I10 Essential (primary) hypertension: Secondary | ICD-10-CM | POA: Diagnosis not present

## 2016-03-24 DIAGNOSIS — E119 Type 2 diabetes mellitus without complications: Secondary | ICD-10-CM | POA: Diagnosis not present

## 2016-03-24 DIAGNOSIS — I779 Disorder of arteries and arterioles, unspecified: Secondary | ICD-10-CM | POA: Diagnosis not present

## 2016-03-25 DIAGNOSIS — I779 Disorder of arteries and arterioles, unspecified: Secondary | ICD-10-CM | POA: Diagnosis not present

## 2016-03-25 DIAGNOSIS — I10 Essential (primary) hypertension: Secondary | ICD-10-CM | POA: Diagnosis not present

## 2016-03-25 DIAGNOSIS — E119 Type 2 diabetes mellitus without complications: Secondary | ICD-10-CM | POA: Diagnosis not present

## 2016-06-23 ENCOUNTER — Emergency Department (HOSPITAL_COMMUNITY): Payer: Medicare HMO

## 2016-06-23 ENCOUNTER — Encounter (HOSPITAL_COMMUNITY): Payer: Self-pay | Admitting: Radiology

## 2016-06-23 ENCOUNTER — Observation Stay (HOSPITAL_COMMUNITY): Payer: Medicare HMO

## 2016-06-23 ENCOUNTER — Inpatient Hospital Stay (HOSPITAL_COMMUNITY)
Admission: EM | Admit: 2016-06-23 | Discharge: 2016-06-25 | DRG: 065 | Disposition: A | Discharge status: Home Health | Payer: Medicare HMO | Attending: Internal Medicine | Admitting: Internal Medicine

## 2016-06-23 DIAGNOSIS — Z91040 Latex allergy status: Secondary | ICD-10-CM

## 2016-06-23 DIAGNOSIS — R29818 Other symptoms and signs involving the nervous system: Secondary | ICD-10-CM | POA: Diagnosis not present

## 2016-06-23 DIAGNOSIS — I639 Cerebral infarction, unspecified: Secondary | ICD-10-CM | POA: Diagnosis not present

## 2016-06-23 DIAGNOSIS — Z91013 Allergy to seafood: Secondary | ICD-10-CM | POA: Diagnosis not present

## 2016-06-23 DIAGNOSIS — F329 Major depressive disorder, single episode, unspecified: Secondary | ICD-10-CM | POA: Diagnosis not present

## 2016-06-23 DIAGNOSIS — E1165 Type 2 diabetes mellitus with hyperglycemia: Secondary | ICD-10-CM | POA: Diagnosis not present

## 2016-06-23 DIAGNOSIS — Z9889 Other specified postprocedural states: Secondary | ICD-10-CM | POA: Diagnosis not present

## 2016-06-23 DIAGNOSIS — R29810 Facial weakness: Secondary | ICD-10-CM | POA: Diagnosis present

## 2016-06-23 DIAGNOSIS — Z7902 Long term (current) use of antithrombotics/antiplatelets: Secondary | ICD-10-CM | POA: Diagnosis not present

## 2016-06-23 DIAGNOSIS — I63511 Cerebral infarction due to unspecified occlusion or stenosis of right middle cerebral artery: Principal | ICD-10-CM | POA: Diagnosis present

## 2016-06-23 DIAGNOSIS — E785 Hyperlipidemia, unspecified: Secondary | ICD-10-CM | POA: Diagnosis present

## 2016-06-23 DIAGNOSIS — Z8249 Family history of ischemic heart disease and other diseases of the circulatory system: Secondary | ICD-10-CM

## 2016-06-23 DIAGNOSIS — Z7982 Long term (current) use of aspirin: Secondary | ICD-10-CM

## 2016-06-23 DIAGNOSIS — I6789 Other cerebrovascular disease: Secondary | ICD-10-CM | POA: Diagnosis not present

## 2016-06-23 DIAGNOSIS — I69354 Hemiplegia and hemiparesis following cerebral infarction affecting left non-dominant side: Secondary | ICD-10-CM | POA: Diagnosis not present

## 2016-06-23 DIAGNOSIS — Z7984 Long term (current) use of oral hypoglycemic drugs: Secondary | ICD-10-CM

## 2016-06-23 DIAGNOSIS — I129 Hypertensive chronic kidney disease with stage 1 through stage 4 chronic kidney disease, or unspecified chronic kidney disease: Secondary | ICD-10-CM | POA: Diagnosis present

## 2016-06-23 DIAGNOSIS — N182 Chronic kidney disease, stage 2 (mild): Secondary | ICD-10-CM | POA: Diagnosis present

## 2016-06-23 DIAGNOSIS — Z87891 Personal history of nicotine dependence: Secondary | ICD-10-CM | POA: Diagnosis not present

## 2016-06-23 DIAGNOSIS — Z823 Family history of stroke: Secondary | ICD-10-CM | POA: Diagnosis not present

## 2016-06-23 DIAGNOSIS — R471 Dysarthria and anarthria: Secondary | ICD-10-CM | POA: Diagnosis not present

## 2016-06-23 DIAGNOSIS — Z79899 Other long term (current) drug therapy: Secondary | ICD-10-CM

## 2016-06-23 DIAGNOSIS — Z880 Allergy status to penicillin: Secondary | ICD-10-CM | POA: Diagnosis not present

## 2016-06-23 DIAGNOSIS — F419 Anxiety disorder, unspecified: Secondary | ICD-10-CM | POA: Diagnosis present

## 2016-06-23 DIAGNOSIS — E1122 Type 2 diabetes mellitus with diabetic chronic kidney disease: Secondary | ICD-10-CM | POA: Diagnosis present

## 2016-06-23 DIAGNOSIS — R4189 Other symptoms and signs involving cognitive functions and awareness: Secondary | ICD-10-CM | POA: Diagnosis not present

## 2016-06-23 DIAGNOSIS — I6523 Occlusion and stenosis of bilateral carotid arteries: Secondary | ICD-10-CM | POA: Diagnosis not present

## 2016-06-23 DIAGNOSIS — I63233 Cerebral infarction due to unspecified occlusion or stenosis of bilateral carotid arteries: Secondary | ICD-10-CM | POA: Diagnosis not present

## 2016-06-23 DIAGNOSIS — I679 Cerebrovascular disease, unspecified: Secondary | ICD-10-CM | POA: Diagnosis not present

## 2016-06-23 DIAGNOSIS — R4701 Aphasia: Secondary | ICD-10-CM | POA: Diagnosis not present

## 2016-06-23 DIAGNOSIS — I10 Essential (primary) hypertension: Secondary | ICD-10-CM | POA: Diagnosis not present

## 2016-06-23 LAB — DIFFERENTIAL
BASOS ABS: 0 10*3/uL (ref 0.0–0.1)
BASOS PCT: 0 %
Eosinophils Absolute: 0 10*3/uL (ref 0.0–0.7)
Eosinophils Relative: 1 %
Lymphocytes Relative: 41 %
Lymphs Abs: 2.9 10*3/uL (ref 0.7–4.0)
MONO ABS: 0.3 10*3/uL (ref 0.1–1.0)
Monocytes Relative: 4 %
NEUTROS ABS: 3.9 10*3/uL (ref 1.7–7.7)
NEUTROS PCT: 54 %

## 2016-06-23 LAB — I-STAT CHEM 8, ED
BUN: 12 mg/dL (ref 6–20)
CHLORIDE: 106 mmol/L (ref 101–111)
CREATININE: 1 mg/dL (ref 0.44–1.00)
Calcium, Ion: 1.07 mmol/L — ABNORMAL LOW (ref 1.15–1.40)
Glucose, Bld: 144 mg/dL — ABNORMAL HIGH (ref 65–99)
HEMATOCRIT: 46 % (ref 36.0–46.0)
Hemoglobin: 15.6 g/dL — ABNORMAL HIGH (ref 12.0–15.0)
POTASSIUM: 3.6 mmol/L (ref 3.5–5.1)
SODIUM: 141 mmol/L (ref 135–145)
TCO2: 25 mmol/L (ref 0–100)

## 2016-06-23 LAB — COMPREHENSIVE METABOLIC PANEL
ALBUMIN: 4.2 g/dL (ref 3.5–5.0)
ALT: 22 U/L (ref 14–54)
AST: 26 U/L (ref 15–41)
Alkaline Phosphatase: 118 U/L (ref 38–126)
Anion gap: 9 (ref 5–15)
BUN: 10 mg/dL (ref 6–20)
CHLORIDE: 106 mmol/L (ref 101–111)
CO2: 25 mmol/L (ref 22–32)
CREATININE: 0.95 mg/dL (ref 0.44–1.00)
Calcium: 9.4 mg/dL (ref 8.9–10.3)
GFR calc non Af Amer: >60 mL/min (ref >60)
GLUCOSE: 141 mg/dL — AB (ref 65–99)
Potassium: 3.6 mmol/L (ref 3.5–5.1)
SODIUM: 140 mmol/L (ref 135–145)
Total Bilirubin: 0.6 mg/dL (ref 0.3–1.2)
Total Protein: 7.6 g/dL (ref 6.5–8.1)

## 2016-06-23 LAB — CBG MONITORING, ED: Glucose-Capillary: 149 mg/dL — ABNORMAL HIGH (ref 65–99)

## 2016-06-23 LAB — GLUCOSE, CAPILLARY: Glucose-Capillary: 137 mg/dL — ABNORMAL HIGH (ref 65–99)

## 2016-06-23 LAB — CBC
HEMATOCRIT: 45.2 % (ref 36.0–46.0)
HEMOGLOBIN: 14.4 g/dL (ref 12.0–15.0)
MCH: 28.9 pg (ref 26.0–34.0)
MCHC: 31.9 g/dL (ref 30.0–36.0)
MCV: 90.6 fL (ref 78.0–100.0)
Platelets: 228 10*3/uL (ref 150–400)
RBC: 4.99 MIL/uL (ref 3.87–5.11)
RDW: 13.7 % (ref 11.5–15.5)
WBC: 7.2 10*3/uL (ref 4.0–10.5)

## 2016-06-23 LAB — I-STAT TROPONIN, ED: TROPONIN I, POC: 0 ng/mL (ref 0.00–0.08)

## 2016-06-23 LAB — PROTIME-INR
INR: 1
Prothrombin Time: 13.2 seconds (ref 11.4–15.2)

## 2016-06-23 LAB — APTT: aPTT: 32 seconds (ref 24–36)

## 2016-06-23 MED ORDER — ACETAMINOPHEN 325 MG PO TABS: 650.0000 mg | ORAL_TABLET | Freq: Four times a day (QID) | ORAL | Status: DC | PRN

## 2016-06-23 MED ORDER — HEPARIN SODIUM (PORCINE) 5000 UNIT/ML IJ SOLN
5000.0000 [IU] | Freq: Three times a day (TID) | INTRAMUSCULAR | Status: DC
  Administered 2016-06-23 – 2016-06-25 (×5): 5000 [IU] via SUBCUTANEOUS
  Filled 2016-06-23 (×5): qty 1

## 2016-06-23 MED ORDER — CLOPIDOGREL BISULFATE 75 MG PO TABS
75.0000 mg | ORAL_TABLET | Freq: Every day | ORAL | Status: DC
  Administered 2016-06-24 – 2016-06-25 (×2): 75 mg via ORAL
  Filled 2016-06-23 (×2): qty 1

## 2016-06-23 MED ORDER — PROMETHAZINE HCL 25 MG PO TABS
12.5000 mg | ORAL_TABLET | Freq: Four times a day (QID) | ORAL | Status: DC | PRN
Start: 2016-06-23 — End: 2016-06-25

## 2016-06-23 MED ORDER — ACETAMINOPHEN 650 MG RE SUPP: 650.0000 mg | Freq: Four times a day (QID) | RECTAL | Status: DC | PRN

## 2016-06-23 MED ORDER — IOPAMIDOL (ISOVUE-370) INJECTION 76%
75.0000 mL | Freq: Once | INTRAVENOUS | Status: AC | PRN
  Administered 2016-06-23: 75 mL via INTRAVENOUS

## 2016-06-23 MED ORDER — ATORVASTATIN CALCIUM 80 MG PO TABS
80.0000 mg | ORAL_TABLET | Freq: Every day | ORAL | Status: DC
Start: 2016-06-24 — End: 2016-06-25
  Administered 2016-06-24 – 2016-06-25 (×2): 80 mg via ORAL
  Filled 2016-06-23 (×2): qty 1

## 2016-06-23 MED ORDER — SENNOSIDES-DOCUSATE SODIUM 8.6-50 MG PO TABS: 1.0000 | ORAL_TABLET | Freq: Every evening | ORAL | Status: DC | PRN

## 2016-06-23 MED ORDER — SODIUM CHLORIDE 0.9% FLUSH
3.0000 mL | Freq: Two times a day (BID) | INTRAVENOUS | Status: DC
  Administered 2016-06-23 – 2016-06-25 (×4): 3 mL via INTRAVENOUS

## 2016-06-23 MED ORDER — INSULIN ASPART 100 UNIT/ML ~~LOC~~ SOLN
0.0000 [IU] | Freq: Three times a day (TID) | SUBCUTANEOUS | Status: DC
  Administered 2016-06-24: 1 [IU] via SUBCUTANEOUS
  Administered 2016-06-24: 2 [IU] via SUBCUTANEOUS

## 2016-06-23 MED ORDER — GABAPENTIN 400 MG PO CAPS
400.0000 mg | ORAL_CAPSULE | Freq: Two times a day (BID) | ORAL | Status: DC
  Administered 2016-06-23 – 2016-06-24 (×3): 400 mg via ORAL
  Filled 2016-06-23 (×4): qty 1

## 2016-06-23 MED ORDER — STROKE: EARLY STAGES OF RECOVERY BOOK
Freq: Once | Status: AC
  Administered 2016-06-23: 21:00:00
  Filled 2016-06-23: qty 1

## 2016-06-23 NOTE — ED Provider Notes (Signed)
[facility] DEPT Provider Note   CSN: [id number] Arrival date & time: 06/23/16  1551     History   Chief Complaint Chief Complaint  Patient presents with  . Code Stroke    HPI Erin Hamilton is a 66 y.o. female.  The history is provided by the patient and the EMS personnel.  Cerebrovascular Accident  This is a new problem. The current episode started 3 to 5 hours ago. The problem occurs constantly. The problem has been gradually improving. Pertinent negatives include no chest pain, no abdominal pain, no headaches and no shortness of breath. Nothing aggravates the symptoms. Nothing relieves the symptoms. She has tried nothing for the symptoms. The treatment provided no relief.    Past Medical History:  Diagnosis Date  . Anxiety   . CVA (cerebral infarction)   . Diabetes mellitus without complication ([place])   . Hyperlipidemia   . Hypertension   . Stroke [facility])     Patient Active Problem List   Diagnosis Date Noted  . Diabetes mellitus without complication ([place]) 42/70/6237  . Essential hypertension, benign 10/10/2014  . Back pain with left-sided sciatica 10/10/2014  . Acute anxiety 10/09/2014  . CVA (cerebral infarction) 10/09/2014  . Hyperlipidemia 10/09/2014  . Depression 10/09/2014  . Hypertensive CKD (chronic kidney disease) 10/09/2014  . DIABETES MELLITUS, TYPE II 09/22/2009  . ACUTE SINUSITIS, UNSPECIFIED 09/22/2009    Past Surgical History:  Procedure Laterality Date  . TUBAL LIGATION      OB History    No data available       Home Medications    Prior to Admission medications   Medication Sig Start Date End Date Taking? Authorizing Provider  aspirin EC 81 MG tablet Take 81 mg by mouth daily.   Yes Historical Provider, MD  atorvastatin (LIPITOR) 80 MG tablet Take 80 mg by mouth daily.   Yes Historical Provider, MD  clopidogrel (PLAVIX) 75 MG tablet TAKE 1 TABLET EVERY DAY Patient taking differently: Take 75 mg by mouth once a day 09/20/15  Yes  [name], NP  gabapentin (NEURONTIN) 400 MG capsule TAKE 1 CAPSULE (400 MG TOTAL) BY MOUTH 3 (THREE) TIMES DAILY. Patient taking differently: Take 400 mg by mouth two times a day 04/29/15  Yes [name], NP  glucose blood test strip 1 each by Other route daily. Use as instructed   Yes Historical Provider, MD  lisinopril (PRINIVIL,ZESTRIL) 10 MG tablet TAKE 1 TABLET EVERY DAY 07/23/15  Yes [name], NP  metFORMIN (GLUCOPHAGE) 500 MG tablet TAKE 1 TABLET TWICE DAILY BEFORE A MEAL 03/19/15  Yes [name], NP  simvastatin (ZOCOR) 20 MG tablet TAKE 1 TABLET EVERY DAY Patient not taking: Reported on 06/23/2016 09/20/15   [name], NP    Family History Family History  Problem Relation Age of Onset  . Stroke Father   . Heart attack Sister   . Cancer Son     throat and lung  . Cancer Sister     gum    Social History Social History  Substance Use Topics  . Smoking status: Former Research scientist (life sciences)  . Smokeless tobacco: Never Used  . Alcohol use No     Allergies   Oysters [shellfish allergy]; Penicillins; and Latex   Review of Systems Review of Systems  Unable to perform ROS: Acuity of condition  Constitutional: Negative for chills and fever.  HENT: Negative for sore throat.   Eyes: Negative for pain and visual disturbance.  Respiratory: Negative for cough and shortness  of breath.   Cardiovascular: Negative for chest pain.  Gastrointestinal: Negative for abdominal pain and vomiting.  Genitourinary: Negative for dysuria and hematuria.  Musculoskeletal: Negative for back pain.  Skin: Negative for rash.  Neurological: Positive for speech difficulty and numbness. Negative for seizures, syncope, facial asymmetry and headaches.  All other systems reviewed and are negative.    Physical Exam Updated Vital Signs BP (!) 216/102   Pulse 94   Temp 98.8 F (37.1 C) (Oral)   Resp 18   Ht 5\' 4"  (1.626 m)   Wt 67.7 kg   LMP  (LMP Unknown)   SpO2 98%   BMI 25.62 kg/m    Physical Exam  Constitutional: She appears well-developed and well-nourished. No distress.  HENT:  Head: Normocephalic and atraumatic.  Eyes: Conjunctivae and EOM are normal.  Neck: Neck supple.  Cardiovascular: Normal rate and regular rhythm.   No murmur heard. Pulmonary/Chest: Effort normal and breath sounds normal. No respiratory distress.  Abdominal: Soft. There is no tenderness.  Musculoskeletal: She exhibits no edema.  Neurological: She is alert. A sensory deficit (Pt with numbness to RUE) is present. No cranial nerve deficit. GCS eye subscore is 4. GCS verbal subscore is 4. GCS motor subscore is 6.  Pt with significant weakness to LUE and LLE which she says is chronic.  Pt with aphasia on examination.  Skin: Skin is warm and dry.  Psychiatric: She has a normal mood and affect. Her speech is delayed.  Nursing note and vitals reviewed.    ED Treatments / Results  Labs (all labs ordered are listed, but only abnormal results are displayed) Labs Reviewed  COMPREHENSIVE METABOLIC PANEL - Abnormal; Notable for the following:       Result Value   Glucose, Bld 141 (*)    All other components within normal limits  CBG MONITORING, ED - Abnormal; Notable for the following:    Glucose-Capillary 149 (*)    All other components within normal limits  I-STAT CHEM 8, ED - Abnormal; Notable for the following:    Glucose, Bld 144 (*)    Calcium, Ion 1.07 (*)    Hemoglobin 15.6 (*)    All other components within normal limits  PROTIME-INR  APTT  CBC  DIFFERENTIAL  I-STAT TROPOININ, ED    EKG  EKG Interpretation  Date/Time:  Tuesday June 23 2016 16:19:55 EDT Ventricular Rate:  91 PR Interval:    QRS Duration: 81 QT Interval:  367 QTC Calculation: 452 R Axis:   57 Text Interpretation:  Sinus rhythm Low voltage, precordial leads Borderline T abnormalities, inferior leads nonspecific t wave changes compared to prior ECG Confirmed by KNAPP  MD-J, JON ([id number]) on 06/23/2016 4:22:48  PM       Radiology Ct Angio Head W Or Wo Contrast  Result Date: 06/23/2016 CLINICAL DATA:  Acute presentation with aphasia. EXAM: CT ANGIOGRAPHY HEAD AND NECK TECHNIQUE: Multidetector CT imaging of the head and neck was performed using the standard protocol during bolus administration of intravenous contrast. Multiplanar CT image reconstructions and MIPs were obtained to evaluate the vascular anatomy. Carotid stenosis measurements (when applicable) are obtained utilizing NASCET criteria, using the distal internal carotid diameter as the denominator. CONTRAST:  75 cc Isovue 370 COMPARISON:  CT earlier same day.  CT angiography 11/17/2010. FINDINGS: CTA NECK FINDINGS Aortic arch: Atherosclerosis of the arch. Branching pattern of the brachiocephalic vessels is normal. No flow limiting stenosis. Right carotid system: Common carotid artery is widely patent to the bifurcation.  Primarily soft plaque at the carotid bifurcation. Minimal diameter at the distal bulb is 2.5 cm. Compared to a more distal cervical ICA diameter of 5 mm, this is a 50% stenosis. Extensive soft plaque would increase risk of embolic disease. Left carotid system: Common carotid artery is widely patent to the bifurcation region. There is extensive calcified plaque at the carotid bifurcation and proximal ICA. There are serial stenoses, maximal at the distal bulb where the diameter measures 2 mm. Compared to a more distal cervical ICA diameter of 5 mm, this indicates a 60% stenosis. Vertebral arteries: Small right vertebral artery shows severe atherosclerotic disease in the proximal 3 cm with serial stenoses 70% or greater. Beyond that, the vessel is patent to the foramen magnum where it supplies PICA and gives a tiny contribution to the basilar. Left vertebral artery shows soft and calcified plaque proximally with stenosis estimated at 50% and irregularity. Beyond that, the vessel is widely patent through the cervical region where it provides  dominant supply to the basilar. Skeleton: Ordinary degenerative spondylosis. Other neck: No significant soft tissue finding. Upper chest: Negative Review of the MIP images confirms the above findings CTA HEAD FINDINGS Anterior circulation: Both internal carotid arteries are patent through the siphon regions. There is extensive atherosclerotic calcification throughout the siphon regions bilaterally with stenosis estimated at 50% on each side. On the right. Supraclinoid ICA supplies the fairly normal appearing anterior cerebral artery. There is a severely diseased M1 segment with long segment serial stenoses 75% or greater. Diminished vessels are seen within the right middle cerebral artery territory corresponding to the region of old infarction. On the left, the anterior cerebral artery is patent with only mild atherosclerotic narrowing. There are serial stenoses of the MCA at the distal M1 and proximal M2 region. Distal M1 stenosis is 75% or greater. More distal MCA branches show perfusion, without identifiable occluded branch. Distal vessels show some atherosclerotic irregularity. Posterior circulation: Both vertebral arteries are patent to the basilar with the left being dominant. No basilar stenosis. Posterior circulation branch vessels are intact. Distal PCA branches show atherosclerotic irregularity, left more than right. Venous sinuses: Patent and normal Anatomic variants: None significant Delayed phase: No abnormal enhancement Review of the MIP images confirms the above findings IMPRESSION: Extensive atherosclerotic disease at the carotid bifurcation and proximal ICA on the left with primarily calcified plaque. 60% stenosis of the ICA at the distal bulb. 50% stenosis in the carotid siphon region with extensive peripheral calcification. 75% or greater stenosis of the distal M1 segment on the left. Stenoses of proximal M2 branches as well. No definite missing vessel or cut off vessel in the left MCA territory.  Extensive soft plaque affecting the distal right common carotid artery, carotid bifurcation and proximal internal carotid artery. Maximal stenosis at the distal bulb is 50%. Extensive soft plaque would place the patient at risk of embolic disease. Calcification of the carotid siphon region with stenosis of 50%. Serial severe stenoses of the middle cerebral artery M1 M2 regions consistent with previous infarction in that region. Dominant left vertebral artery shows atherosclerotic disease at its origin and in the proximal 2 cm with calcified plaque and irregular narrowing of the lumen. Stenosis is estimated at 50%. Beyond that, the vessel is widely patent to the basilar. Non dominant right vertebral artery shows severe atherosclerotic disease affecting its proximal 3 cm with serial severe stenoses, 70% or greater. Beyond that, the vessel is patent supplying right PICA and contributing to basilar. Findings were discussed  in person with Dr. Leonel Ramsay at approximately 4:15 p.m. Electronically Signed   By: Erin Chimes M.D.   On: 06/23/2016 16:34   Ct Angio Neck W Or Wo Contrast  Result Date: 06/23/2016 CLINICAL DATA:  Acute presentation with aphasia. EXAM: CT ANGIOGRAPHY HEAD AND NECK TECHNIQUE: Multidetector CT imaging of the head and neck was performed using the standard protocol during bolus administration of intravenous contrast. Multiplanar CT image reconstructions and MIPs were obtained to evaluate the vascular anatomy. Carotid stenosis measurements (when applicable) are obtained utilizing NASCET criteria, using the distal internal carotid diameter as the denominator. CONTRAST:  75 cc Isovue 370 COMPARISON:  CT earlier same day.  CT angiography 11/17/2010. FINDINGS: CTA NECK FINDINGS Aortic arch: Atherosclerosis of the arch. Branching pattern of the brachiocephalic vessels is normal. No flow limiting stenosis. Right carotid system: Common carotid artery is widely patent to the bifurcation. Primarily soft  plaque at the carotid bifurcation. Minimal diameter at the distal bulb is 2.5 cm. Compared to a more distal cervical ICA diameter of 5 mm, this is a 50% stenosis. Extensive soft plaque would increase risk of embolic disease. Left carotid system: Common carotid artery is widely patent to the bifurcation region. There is extensive calcified plaque at the carotid bifurcation and proximal ICA. There are serial stenoses, maximal at the distal bulb where the diameter measures 2 mm. Compared to a more distal cervical ICA diameter of 5 mm, this indicates a 60% stenosis. Vertebral arteries: Small right vertebral artery shows severe atherosclerotic disease in the proximal 3 cm with serial stenoses 70% or greater. Beyond that, the vessel is patent to the foramen magnum where it supplies PICA and gives a tiny contribution to the basilar. Left vertebral artery shows soft and calcified plaque proximally with stenosis estimated at 50% and irregularity. Beyond that, the vessel is widely patent through the cervical region where it provides dominant supply to the basilar. Skeleton: Ordinary degenerative spondylosis. Other neck: No significant soft tissue finding. Upper chest: Negative Review of the MIP images confirms the above findings CTA HEAD FINDINGS Anterior circulation: Both internal carotid arteries are patent through the siphon regions. There is extensive atherosclerotic calcification throughout the siphon regions bilaterally with stenosis estimated at 50% on each side. On the right. Supraclinoid ICA supplies the fairly normal appearing anterior cerebral artery. There is a severely diseased M1 segment with long segment serial stenoses 75% or greater. Diminished vessels are seen within the right middle cerebral artery territory corresponding to the region of old infarction. On the left, the anterior cerebral artery is patent with only mild atherosclerotic narrowing. There are serial stenoses of the MCA at the distal M1 and  proximal M2 region. Distal M1 stenosis is 75% or greater. More distal MCA branches show perfusion, without identifiable occluded branch. Distal vessels show some atherosclerotic irregularity. Posterior circulation: Both vertebral arteries are patent to the basilar with the left being dominant. No basilar stenosis. Posterior circulation branch vessels are intact. Distal PCA branches show atherosclerotic irregularity, left more than right. Venous sinuses: Patent and normal Anatomic variants: None significant Delayed phase: No abnormal enhancement Review of the MIP images confirms the above findings IMPRESSION: Extensive atherosclerotic disease at the carotid bifurcation and proximal ICA on the left with primarily calcified plaque. 60% stenosis of the ICA at the distal bulb. 50% stenosis in the carotid siphon region with extensive peripheral calcification. 75% or greater stenosis of the distal M1 segment on the left. Stenoses of proximal M2 branches as well. No definite missing  vessel or cut off vessel in the left MCA territory. Extensive soft plaque affecting the distal right common carotid artery, carotid bifurcation and proximal internal carotid artery. Maximal stenosis at the distal bulb is 50%. Extensive soft plaque would place the patient at risk of embolic disease. Calcification of the carotid siphon region with stenosis of 50%. Serial severe stenoses of the middle cerebral artery M1 M2 regions consistent with previous infarction in that region. Dominant left vertebral artery shows atherosclerotic disease at its origin and in the proximal 2 cm with calcified plaque and irregular narrowing of the lumen. Stenosis is estimated at 50%. Beyond that, the vessel is widely patent to the basilar. Non dominant right vertebral artery shows severe atherosclerotic disease affecting its proximal 3 cm with serial severe stenoses, 70% or greater. Beyond that, the vessel is patent supplying right PICA and contributing to  basilar. Findings were discussed in person with Dr. Leonel Ramsay at approximately 4:15 p.m. Electronically Signed   By: Erin Chimes M.D.   On: 06/23/2016 16:34   Ct Head Code Stroke W/o Cm  Result Date: 06/23/2016 CLINICAL DATA:  Code stroke. Aphasia. Previous stroke. Last seen normal 5 hours ago. EXAM: CT HEAD WITHOUT CONTRAST TECHNIQUE: Contiguous axial images were obtained from the base of the skull through the vertex without intravenous contrast. COMPARISON:  None. FINDINGS: Brain: Old right middle cerebral artery territory infarction with atrophy cephalo malacia and gliosis in the right insula and frontoparietal region. Chronic small-vessel ischemic changes affecting the cerebral hemispheric white matter. Old infarction of the right basal ganglia. No sign of acute infarction, mass lesion, hemorrhage, hydrocephalus or extra-axial collection. Vascular: There is atherosclerotic calcification of the major vessels at the base of the brain. Skull: Negative Sinuses/Orbits: Clear/ normal Other: None significant ASPECTS ([place] Stroke Program Early CT Score) - Ganglionic level infarction (caudate, lentiform nuclei, internal capsule, insula, M1-M3 cortex): 7 - Supraganglionic infarction (M4-M6 cortex): 3 Total score (0-10 with 10 being normal): 10 IMPRESSION: 1. Old right MCA territory infarction. Chronic small-vessel ischemic changes of both hemispheres. No acute finding. 2. ASPECTS is 10 These results were called by telephone at the time of interpretation on 06/23/2016 at 3:59 pm to Dr. Leonel Ramsay , who verbally acknowledged these results. Electronically Signed   By: Erin Chimes M.D.   On: 06/23/2016 16:01    Procedures Procedures (including critical care time)  Medications Ordered in ED Medications  iopamidol (ISOVUE-370) 76 % injection 75 mL (75 mLs Intravenous Contrast Given 06/23/16 1601)     Initial Impression / Assessment and Plan / ED Course  I have reviewed the triage vital signs and the  nursing notes.  Pertinent labs & imaging results that were available during my care of the patient were reviewed by me and considered in my medical decision making (see chart for details).     66 year old female with history of previous CVA resulting in left-sided significant weakness presents in the setting of concern for new CVA. Code stroke was initiated. Patient quickly assessed. Patient began having symptoms of a seizure and right-sided numbness earlier today. Last known normal was approximately 11 AM. Patient quickly assessed by emergency team as well as neurology team and taken to [facility]. Imaging concerning for acute CVA however patient is outside of TPA window. Patient continues to have significant aphasia. Laboratory analysis without significant abnormalities and patient's condition unchanged. Otherwise hemodynamic stable and afebrile.  Patient will require admission to medicine team for further management of new CVA. Neurology team reports he will  continue to consult on inpatient service. Patient stable at time of transfer of care.  Final Clinical Impressions(s) / ED Diagnoses   Final diagnoses:  Aphasia  Cerebrovascular accident (CVA), unspecified mechanism ([place])    New Prescriptions New Prescriptions   No medications on file     Esaw Grandchild, MD 06/23/16 1712    Dorie Rank, MD 06/25/16 1011

## 2016-06-23 NOTE — ED Notes (Signed)
Pt back in room with this RN, Neuro and EMT

## 2016-06-23 NOTE — H&P (Signed)
Date: 06/23/2016               Patient Name:  Erin Hamilton MRN: [id number]  DOB: 1950/10/26 Age / Sex: 66 y.o., female   PCP: [name], NP         Medical Service: Internal Medicine Teaching Service         Attending Physician: Dr. Lucious Groves, DO    First Contact: Dr. Holley Raring Pager: [phone]  Second Contact: Dr. Burgess Estelle Pager: [phone]       After Hours (After 5p /  First Contact Pager: [phone]  Weekends / Holidays): Second Contact Pager: [phone]   Chief Complaint: Aphasia  History of Present Illness: Erin Hamilton is a 66 y.o. female with a h/o of HTN, DM, h/o CVA (2012, left deficits) who presents with acute onset expressive aphasia.  Patient reports that she was in her normal state of health until approximately 11 AM this morning when she began to have difficulty speaking. She reports that she has not had difficulty speaking before. She had a stroke in 2012 and has had residual left-sided deficits with mild decrease in strength of her left upper and lower extremities as well as left facial droop and hypersensitivity of the left face. Patient notes that the difficulty speaking was concerning to her and she initially had no ability to speak. Her symptoms resolved mildly in route to the emergency department by EMS she was still significantly aphasic on arrival.  In the ED patient received a CT which showed no hemorrhage, she received a CTA on recommendation of neurology who evaluated the patient on arrival. CTA she significant calcifications and stenoses throughout the major blood vessels. There was significant concern for embolism with multiple plaques. Patient was admitted to INT S for observation and stroke workup.  Meds: Current Outpatient Prescriptions  Medication Sig Dispense Refill  . aspirin EC 81 MG tablet Take 81 mg by mouth daily.    Marland Kitchen atorvastatin (LIPITOR) 80 MG tablet Take 80 mg by mouth daily.    . clopidogrel (PLAVIX) 75 MG tablet TAKE 1  TABLET EVERY DAY (Patient taking differently: Take 75 mg by mouth once a day) 90 tablet 1  . gabapentin (NEURONTIN) 400 MG capsule TAKE 1 CAPSULE (400 MG TOTAL) BY MOUTH 3 (THREE) TIMES DAILY. (Patient taking differently: Take 400 mg by mouth two times a day) 270 capsule 3  . glucose blood test strip 1 each by Other route daily. Use as instructed    . lisinopril (PRINIVIL,ZESTRIL) 10 MG tablet TAKE 1 TABLET EVERY DAY 90 tablet 0  . metFORMIN (GLUCOPHAGE) 500 MG tablet TAKE 1 TABLET TWICE DAILY BEFORE A MEAL 180 tablet 0  . simvastatin (ZOCOR) 20 MG tablet TAKE 1 TABLET EVERY DAY (Patient not taking: Reported on 06/23/2016) 90 tablet 1   Allergies: Allergies as of 06/23/2016 - Review Complete 06/23/2016  Allergen Reaction Noted  . Oysters [shellfish allergy] Anaphylaxis 06/23/2016  . Penicillins Anaphylaxis 09/22/2009  . Latex Rash 06/23/2016   Past Medical History:  Diagnosis Date  . Anxiety   . CVA (cerebral infarction)   . Diabetes mellitus without complication ([place])   . Hyperlipidemia   . Hypertension   . Stroke [facility])    Family History: Pt family history includes Cancer in her sister and son; Heart attack in her sister; Stroke in her father.  Social History: Pt  reports that she has quit smoking. She has never used smokeless tobacco. She reports that she  does not drink alcohol or use drugs.  Review of Systems: A complete ROS was negative except as per HPI. Review of Systems  Constitutional: Negative for chills, fever and weight loss.  Eyes: Negative for blurred vision.  Respiratory: Negative for cough and shortness of breath.   Cardiovascular: Negative for chest pain and leg swelling.  Gastrointestinal: Negative for abdominal pain, constipation, diarrhea, nausea and vomiting.  Genitourinary: Negative for dysuria, frequency and urgency.  Musculoskeletal: Negative for myalgias.  Skin: Negative for rash.  Neurological: Negative for dizziness, tremors and headaches.    Endo/Heme/Allergies: Negative for polydipsia.  Psychiatric/Behavioral: The patient is not nervous/anxious.    Physical Exam: Vitals:   06/23/16 1703 06/23/16 1730 06/23/16 1746 06/23/16 1815  BP: (!) 171/83 (!) 159/75  (!) 173/90  Pulse: 85 77  83  Resp: 15 14  18   Temp:   98.8 F (37.1 C)   TempSrc:      SpO2: 100% 100%  99%  Weight:      Height:       Physical Exam  Constitutional: She is oriented to person, place, and time. She appears well-developed. She is cooperative. No distress.  HENT:  Head: Normocephalic and atraumatic.  Right Ear: Hearing normal.  Left Ear: Hearing normal.  Nose: Nose normal.  Mouth/Throat: Mucous membranes are normal.  Cardiovascular: Normal rate, regular rhythm, S1 normal, S2 normal and intact distal pulses.  Exam reveals no gallop.   No murmur heard. Pulmonary/Chest: Effort normal and breath sounds normal. No respiratory distress. She has no wheezes. She has no rhonchi. She has no rales. She exhibits no tenderness. Breasts are symmetrical.  Abdominal: Soft. Normal appearance and bowel sounds are normal. She exhibits no ascites. There is no hepatosplenomegaly. There is no tenderness. There is no CVA tenderness.  Musculoskeletal: Normal range of motion.  Neurological: She is alert and oriented to person, place, and time. She has normal strength. She displays no atrophy and no tremor. A cranial nerve deficit (L facial droop) and sensory deficit (hypersensativity to L face) is present. She exhibits abnormal muscle tone (LUE).  Strength intact throughout.  Skin: Skin is warm, dry and intact. She is not diaphoretic.  Psychiatric: She has a normal mood and affect. Her speech is normal and behavior is normal.   Labs: ABG:  Recent Labs Lab 06/23/16 1556  TCO2 25   CBC:  Recent Labs Lab 06/23/16 1555 06/23/16 1556  WBC 7.2  --   NEUTROABS 3.9  --   HGB 14.4 15.6*  HCT 45.2 46.0  MCV 90.6  --   PLT 228  --    Basic Metabolic  Panel:  Recent Labs Lab 06/23/16 1555 06/23/16 1556  NA 140 141  K 3.6 3.6  CL 106 106  CO2 25  --   GLUCOSE 141* 144*  BUN 10 12  CREATININE 0.95 1.00  CALCIUM 9.4  --    Cardiac Enzymes:  Recent Labs Lab 06/23/16 1554  TROPIPOC 0.00   Coagulation Studies:  Recent Labs  06/23/16 1555  LABPROT 13.2  INR 1.00   Liver Function Tests:  Recent Labs Lab 06/23/16 1555  AST 26  ALT 22  ALKPHOS 118  BILITOT 0.6  PROT 7.6  ALBUMIN 4.2    Recent Labs Lab 06/23/16 1549  GLUCAP 149*   Imaging: EKG Interpretation  Date/Time:  Tuesday June 23 2016 16:19:55 EDT Ventricular Rate:  91 PR Interval:    QRS Duration: 81 QT Interval:  367 QTC Calculation: 452 R  Axis:   57 Text Interpretation:  Sinus rhythm Low voltage, precordial leads Borderline T abnormalities, inferior leads nonspecific t wave changes compared to prior ECG Confirmed by KNAPP  MD-J, JON ([id number]) on 06/23/2016 4:22:48 PM  Ct Angio Head W Or Wo Contrast Ct Angio Neck W Or Wo Contrast Result Date: 06/23/2016 CLINICAL DATA:  Acute presentation with aphasia. EXAM: CT ANGIOGRAPHY HEAD AND NECK TECHNIQUE: Multidetector CT imaging of the head and neck was performed using the standard protocol during bolus administration of intravenous contrast. Multiplanar CT image reconstructions and MIPs were obtained to evaluate the vascular anatomy. Carotid stenosis measurements (when applicable) are obtained utilizing NASCET criteria, using the distal internal carotid diameter as the denominator. CONTRAST:  75 cc Isovue 370 COMPARISON:  CT earlier same day.  CT angiography 11/17/2010. FINDINGS: CTA NECK FINDINGS Aortic arch: Atherosclerosis of the arch. Branching pattern of the brachiocephalic vessels is normal. No flow limiting stenosis. Right carotid system: Common carotid artery is widely patent to the bifurcation. Primarily soft plaque at the carotid bifurcation. Minimal diameter at the distal bulb is 2.5 cm. Compared to a  more distal cervical ICA diameter of 5 mm, this is a 50% stenosis. Extensive soft plaque would increase risk of embolic disease. Left carotid system: Common carotid artery is widely patent to the bifurcation region. There is extensive calcified plaque at the carotid bifurcation and proximal ICA. There are serial stenoses, maximal at the distal bulb where the diameter measures 2 mm. Compared to a more distal cervical ICA diameter of 5 mm, this indicates a 60% stenosis. Vertebral arteries: Small right vertebral artery shows severe atherosclerotic disease in the proximal 3 cm with serial stenoses 70% or greater. Beyond that, the vessel is patent to the foramen magnum where it supplies PICA and gives a tiny contribution to the basilar. Left vertebral artery shows soft and calcified plaque proximally with stenosis estimated at 50% and irregularity. Beyond that, the vessel is widely patent through the cervical region where it provides dominant supply to the basilar. Skeleton: Ordinary degenerative spondylosis. Other neck: No significant soft tissue finding. Upper chest: Negative Review of the MIP images confirms the above findings CTA HEAD FINDINGS Anterior circulation: Both internal carotid arteries are patent through the siphon regions. There is extensive atherosclerotic calcification throughout the siphon regions bilaterally with stenosis estimated at 50% on each side. On the right. Supraclinoid ICA supplies the fairly normal appearing anterior cerebral artery. There is a severely diseased M1 segment with long segment serial stenoses 75% or greater. Diminished vessels are seen within the right middle cerebral artery territory corresponding to the region of old infarction. On the left, the anterior cerebral artery is patent with only mild atherosclerotic narrowing. There are serial stenoses of the MCA at the distal M1 and proximal M2 region. Distal M1 stenosis is 75% or greater. More distal MCA branches show perfusion,  without identifiable occluded branch. Distal vessels show some atherosclerotic irregularity. Posterior circulation: Both vertebral arteries are patent to the basilar with the left being dominant. No basilar stenosis. Posterior circulation branch vessels are intact. Distal PCA branches show atherosclerotic irregularity, left more than right. Venous sinuses: Patent and normal Anatomic variants: None significant Delayed phase: No abnormal enhancement Review of the MIP images confirms the above findings IMPRESSION: Extensive atherosclerotic disease at the carotid bifurcation and proximal ICA on the left with primarily calcified plaque. 60% stenosis of the ICA at the distal bulb. 50% stenosis in the carotid siphon region with extensive peripheral calcification. 75% or greater  stenosis of the distal M1 segment on the left. Stenoses of proximal M2 branches as well. No definite missing vessel or cut off vessel in the left MCA territory. Extensive soft plaque affecting the distal right common carotid artery, carotid bifurcation and proximal internal carotid artery. Maximal stenosis at the distal bulb is 50%. Extensive soft plaque would place the patient at risk of embolic disease. Calcification of the carotid siphon region with stenosis of 50%. Serial severe stenoses of the middle cerebral artery M1 M2 regions consistent with previous infarction in that region. Dominant left vertebral artery shows atherosclerotic disease at its origin and in the proximal 2 cm with calcified plaque and irregular narrowing of the lumen. Stenosis is estimated at 50%. Beyond that, the vessel is widely patent to the basilar. Non dominant right vertebral artery shows severe atherosclerotic disease affecting its proximal 3 cm with serial severe stenoses, 70% or greater. Beyond that, the vessel is patent supplying right PICA and contributing to basilar. Findings were discussed in person with Dr. Leonel Ramsay at approximately 4:15 p.m. Electronically  Signed   By: Nelson Chimes M.D.   On: 06/23/2016 16:34   Ct Head Code Stroke W/o Cm Result Date: 06/23/2016 CLINICAL DATA:  Code stroke. Aphasia. Previous stroke. Last seen normal 5 hours ago. EXAM: CT HEAD WITHOUT CONTRAST TECHNIQUE: Contiguous axial images were obtained from the base of the skull through the vertex without intravenous contrast. COMPARISON:  None. FINDINGS: Brain: Old right middle cerebral artery territory infarction with atrophy cephalo malacia and gliosis in the right insula and frontoparietal region. Chronic small-vessel ischemic changes affecting the cerebral hemispheric white matter. Old infarction of the right basal ganglia. No sign of acute infarction, mass lesion, hemorrhage, hydrocephalus or extra-axial collection. Vascular: There is atherosclerotic calcification of the major vessels at the base of the brain. Skull: Negative Sinuses/Orbits: Clear/ normal Other: None significant ASPECTS ([place] Stroke Program Early CT Score) - Ganglionic level infarction (caudate, lentiform nuclei, internal capsule, insula, M1-M3 cortex): 7 - Supraganglionic infarction (M4-M6 cortex): 3 Total score (0-10 with 10 being normal): 10 IMPRESSION: 1. Old right MCA territory infarction. Chronic small-vessel ischemic changes of both hemispheres. No acute finding. 2. ASPECTS is 10 These results were called by telephone at the time of interpretation on 06/23/2016 at 3:59 pm to Dr. Leonel Ramsay , who verbally acknowledged these results. Electronically Signed   By: Nelson Chimes M.D.   On: 06/23/2016 16:01   Assessment & Plan by Problem: Active Problems:   CVA (cerebral vascular accident) [facility])  Erin Hamilton is a 66 y.o. female with HTN, DM, h/o CVA w/ L deficits presents with acute onset of expressive aphasia at 11:00am today.  1) CVA: Pt w/ expressive aphasia and residual left facial droop and hyperparasthesia, outside TPA window. No hemorrhage of CT, CTA demonstrates extensive large vessel disease with  concern for embolic risk. h/o R cardotid endarterectomy noted on CTA. Neurology following. Taking DAPT at home, and already on Atorva 80 + Simva 20mg . - admit to tele for obs - MRI Brain - stroke w/u: echo, A1c, lipids - antiplatelet w/ plavix + ASA (home regimen) - continue atorva 80mg  - PT/OT/SLP  2) HTN: hypertensive to 210s/100s on arrival. Allow permissive HTN, goal < 220/120, until MRI back, if negative can restart home meds: lisinopril 10mg  qD.  3) DM: BG 149 on admission, no A1c in system, will recheck. Hold home meds: metformin 500mg  qD. - SSI-s  4) Depression: Pt reports that she does not want to live.  She is frustrated and upset with her difficulty speaking and may also have some component of post-CVA mood changes. She has not previously been treated with antidepressants but will likely benefit from this in the future.  DVT PPx - SCD's while in bed  Code Status - Full, pending discussion with ex-husband and daughter (POA)  Consults Placed - Neurology  Dispo: Admit patient to Observation with expected length of stay less than 2 midnights.  Signed: Holley Raring, MD 06/23/2016, 6:33 PM  Pager: [phone]

## 2016-06-23 NOTE — ED Notes (Signed)
Dr Tobias Alexander in room with pt to discuss CT results.

## 2016-06-23 NOTE — Consult Note (Signed)
Requesting Physician:ED MD    Chief Complaint: code stroke  History obtained from:  Patient    HPI:                                                                                                                                         Erin Hamilton is an 66 y.o. female who was on the phone with her brother today at 11:00 when he noted she was having difficulty getting her words out and seems to be slurring her speech. To him he did not feel she was acting right. EMS was called. They felt she improved slightly on way in but still had expressive aphasia. She has a residual left facial droop from old stroke right hemispheric stroke.   CT head was obtained showing no stroke. She is out of window for tPA. Due to question of large vessel occlusion and good Cr will obtain CTA head and neck.  Date last known well: Date: 06/23/2016 Time last known well: Time: 11:00 tPA Given: No: out of window  Modified Rankin: 2   Past Medical History:  Diagnosis Date  . Anxiety   . CVA (cerebral infarction)   . Diabetes mellitus without complication ([place])   . Hyperlipidemia   . Hypertension   . Stroke [facility])     Past Surgical History:  Procedure Laterality Date  . TUBAL LIGATION      Family History  Problem Relation Age of Onset  . Stroke Father   . Heart attack Sister   . Cancer Son     throat and lung  . Cancer Sister     gum   Social History:  reports that she has quit smoking. She has never used smokeless tobacco. She reports that she does not drink alcohol or use drugs.  Allergies:  Allergies  Allergen Reactions  . Penicillins     Medications:                                                                                                                           No current facility-administered medications for this encounter.    Current Outpatient Prescriptions  Medication Sig Dispense Refill  . clopidogrel (PLAVIX) 75 MG tablet TAKE 1 TABLET EVERY DAY 90 tablet 1  .  gabapentin (NEURONTIN) 400 MG capsule TAKE 1 CAPSULE (400 MG TOTAL) BY MOUTH 3 (THREE)  TIMES DAILY. 270 capsule 3  . glucose blood test strip 1 each by Other route daily. Use as instructed    . lisinopril (PRINIVIL,ZESTRIL) 10 MG tablet TAKE 1 TABLET EVERY DAY 90 tablet 0  . metFORMIN (GLUCOPHAGE) 500 MG tablet TAKE 1 TABLET TWICE DAILY BEFORE A MEAL 180 tablet 0  . simvastatin (ZOCOR) 20 MG tablet TAKE 1 TABLET EVERY DAY 90 tablet 1     ROS:                                                                                                                                       History obtained from the patient  General ROS: negative for - chills, fatigue, fever, night sweats, weight gain or weight loss Psychological ROS: negative for - behavioral disorder, hallucinations, memory difficulties, mood swings or suicidal ideation Ophthalmic ROS: negative for - blurry vision, double vision, eye pain or loss of vision ENT ROS: negative for - epistaxis, nasal discharge, oral lesions, sore throat, tinnitus or vertigo Allergy and Immunology ROS: negative for - hives or itchy/watery eyes Hematological and Lymphatic ROS: negative for - bleeding problems, bruising or swollen lymph nodes Endocrine ROS: negative for - galactorrhea, hair pattern changes, polydipsia/polyuria or temperature intolerance Respiratory ROS: negative for - cough, hemoptysis, shortness of breath or wheezing Cardiovascular ROS: negative for - chest pain, dyspnea on exertion, edema or irregular heartbeat Gastrointestinal ROS: negative for - abdominal pain, diarrhea, hematemesis, nausea/vomiting or stool incontinence Genito-Urinary ROS: negative for - dysuria, hematuria, incontinence or urinary frequency/urgency Musculoskeletal ROS: negative for - joint swelling or muscular weakness Neurological ROS: as noted in HPI Dermatological ROS: negative for rash and skin lesion changes  Neurologic Examination:                                                                                                       There were no vitals taken for this visit.  HEENT-  Normocephalic, no lesions, without obvious abnormality.  Normal external eye and conjunctiva.  Normal TM's bilaterally.  Normal auditory canals and external ears. Normal external nose, mucus membranes and septum.  Normal pharynx. Cardiovascular- S1, S2 normal, pulses palpable throughout   Lungs- chest clear, no wheezing, rales, normal symmetric air entry Abdomen- normal findings: bowel sounds normal Extremities- no edema Lymph-no adenopathy palpable Musculoskeletal-no joint tenderness, deformity or swelling Skin-warm and dry, no hyperpigmentation, vitiligo, or suspicious lesions  Neurological Examination Mental Status: Alert, oriented, thought content appropriate.  Speech dysarthric with evidence of both  expressive and receptive  aphasia.  Able to follow simple commands with some need to repeat commoands. Cranial Nerves: II: Discs flat bilaterally; Visual fields grossly normal,  III,IV, VI: ptosis not present, extra-ocular motions intact bilaterally, pupils right is 3 and left is 5  equal, round, reactive to light and accommodation V,VII: smile asymmetric on the left, facial light touch sensation normal bilaterally VIII: hearing normal bilaterally IX,X: uvula rises symmetrically XI: bilateral shoulder shrug XII: midline tongue extension Motor: Right : Upper extremity   5/5    Left:     Upper extremity   5/5  Lower extremity   5/5     Lower extremity   5/5 Tone and bulk:normal tone throughout; no atrophy noted Sensory: Pinprick and light touch decreased on the left face ,a arm and leg due to old strtoke Deep Tendon Reflexes: 1+ and symmetric throughout Plantars: Right: downgoing   Left: up going Cerebellar: normal finger-to-nose,and normal heel-to-shin test Gait: not tested       Lab Results: Basic Metabolic Panel: No results for input(s): NA, K, CL, CO2,  GLUCOSE, BUN, CREATININE, CALCIUM, MG, PHOS in the last 168 hours.  Liver Function Tests: No results for input(s): AST, ALT, ALKPHOS, BILITOT, PROT, ALBUMIN in the last 168 hours. No results for input(s): LIPASE, AMYLASE in the last 168 hours. No results for input(s): AMMONIA in the last 168 hours.  CBC: No results for input(s): WBC, NEUTROABS, HGB, HCT, MCV, PLT in the last 168 hours.  Cardiac Enzymes: No results for input(s): CKTOTAL, CKMB, CKMBINDEX, TROPONINI in the last 168 hours.  Lipid Panel: No results for input(s): CHOL, TRIG, HDL, CHOLHDL, VLDL, LDLCALC in the last 168 hours.  CBG: No results for input(s): GLUCAP in the last 168 hours.  Microbiology: No results found for this or any previous visit.  Coagulation Studies: No results for input(s): LABPROT, INR in the last 72 hours.  Imaging: No results found.     Assessment and plan discussed with with attending physician and they are in agreement.    Etta Quill PA-C [facility] [phone]  06/23/2016, 3:51 PM   Assessment: 66 y.o. female presenting with expressive and receptive aphasia, dysarthria, unequal pupils (likely old) and residual left decreased sensation and facial droop. She was out of window for tPA and CTA head and neck did not show any large vessel occlusion, but does show severe MCA stenosis bilaterally. I suspect large vessel athero as etiology.   Stroke Risk Factors - diabetes mellitus, hyperlipidemia and hypertension  Recommend:  1. HgbA1c, fasting lipid panel 2. MRI of the brain without contrast 3. PT consult, OT consult, Speech consult 4. Echocardiogram 6. Prophylactic therapy-Antiplatelet med: Plavix - dose 75 mg daily as long as passes swallow screen. Otherwise ASA.  7. Risk factor modification 8. Telemetry monitoring 9. Frequent neuro checks 10 NPO until passes stroke swallow screen 11 please page stroke NP  Or  PA  Or MD from 8am -4 pm  as this patient from this time  will be  followed by the stroke.   You can look them up on [email or web address]  Password [email or web address]   Roland Rack, MD [facility] [phone]  If 7pm- 7am, please page neurology on call as listed in [place].

## 2016-06-23 NOTE — ED Triage Notes (Signed)
Per  EMS, pt from home, LSN 1100, pt was talking on the phone with her family member and speaking normal at 1100 and then at 55 began to have slurred speech. With EMS, pt altered with slurred speech and hypertensive 210/110, CBG 120. Pt has hx of CVA with Left arm deficits.

## 2016-06-23 NOTE — ED Notes (Signed)
Pt's bed sheets changed and pt cleaned. Pt urinated in bed. Pt comfortable now. Brief in place.

## 2016-06-23 NOTE — ED Notes (Signed)
Pt transported to MRI 

## 2016-06-23 NOTE — Progress Notes (Signed)
Pt admitted from ED with stroke diagnosis, alert and oriented with expressive aphasia but follows command appropriately, pt settled in bed with call light and family at bedside, tele monitor put and verified on pt, was however reassured and will continue to monitor. [place], [place]

## 2016-06-23 NOTE — ED Notes (Signed)
Pt placed on bedpan

## 2016-06-24 ENCOUNTER — Observation Stay (HOSPITAL_BASED_OUTPATIENT_CLINIC_OR_DEPARTMENT_OTHER): Payer: Medicare HMO

## 2016-06-24 DIAGNOSIS — F419 Anxiety disorder, unspecified: Secondary | ICD-10-CM | POA: Diagnosis present

## 2016-06-24 DIAGNOSIS — E1165 Type 2 diabetes mellitus with hyperglycemia: Secondary | ICD-10-CM

## 2016-06-24 DIAGNOSIS — Z91013 Allergy to seafood: Secondary | ICD-10-CM | POA: Diagnosis not present

## 2016-06-24 DIAGNOSIS — Z7984 Long term (current) use of oral hypoglycemic drugs: Secondary | ICD-10-CM

## 2016-06-24 DIAGNOSIS — I69354 Hemiplegia and hemiparesis following cerebral infarction affecting left non-dominant side: Secondary | ICD-10-CM | POA: Diagnosis not present

## 2016-06-24 DIAGNOSIS — R4189 Other symptoms and signs involving cognitive functions and awareness: Secondary | ICD-10-CM | POA: Diagnosis not present

## 2016-06-24 DIAGNOSIS — Z91040 Latex allergy status: Secondary | ICD-10-CM | POA: Diagnosis not present

## 2016-06-24 DIAGNOSIS — Z79899 Other long term (current) drug therapy: Secondary | ICD-10-CM | POA: Diagnosis not present

## 2016-06-24 DIAGNOSIS — R471 Dysarthria and anarthria: Secondary | ICD-10-CM | POA: Diagnosis present

## 2016-06-24 DIAGNOSIS — Z823 Family history of stroke: Secondary | ICD-10-CM

## 2016-06-24 DIAGNOSIS — Z7902 Long term (current) use of antithrombotics/antiplatelets: Secondary | ICD-10-CM | POA: Diagnosis not present

## 2016-06-24 DIAGNOSIS — I6523 Occlusion and stenosis of bilateral carotid arteries: Secondary | ICD-10-CM

## 2016-06-24 DIAGNOSIS — I6789 Other cerebrovascular disease: Secondary | ICD-10-CM

## 2016-06-24 DIAGNOSIS — Z8249 Family history of ischemic heart disease and other diseases of the circulatory system: Secondary | ICD-10-CM | POA: Diagnosis not present

## 2016-06-24 DIAGNOSIS — Z880 Allergy status to penicillin: Secondary | ICD-10-CM

## 2016-06-24 DIAGNOSIS — Z809 Family history of malignant neoplasm, unspecified: Secondary | ICD-10-CM

## 2016-06-24 DIAGNOSIS — I63511 Cerebral infarction due to unspecified occlusion or stenosis of right middle cerebral artery: Secondary | ICD-10-CM | POA: Diagnosis present

## 2016-06-24 DIAGNOSIS — Z9889 Other specified postprocedural states: Secondary | ICD-10-CM

## 2016-06-24 DIAGNOSIS — I129 Hypertensive chronic kidney disease with stage 1 through stage 4 chronic kidney disease, or unspecified chronic kidney disease: Secondary | ICD-10-CM | POA: Diagnosis present

## 2016-06-24 DIAGNOSIS — R29810 Facial weakness: Secondary | ICD-10-CM | POA: Diagnosis present

## 2016-06-24 DIAGNOSIS — I639 Cerebral infarction, unspecified: Secondary | ICD-10-CM | POA: Diagnosis not present

## 2016-06-24 DIAGNOSIS — I10 Essential (primary) hypertension: Secondary | ICD-10-CM | POA: Diagnosis not present

## 2016-06-24 DIAGNOSIS — I63233 Cerebral infarction due to unspecified occlusion or stenosis of bilateral carotid arteries: Secondary | ICD-10-CM | POA: Diagnosis not present

## 2016-06-24 DIAGNOSIS — R4701 Aphasia: Secondary | ICD-10-CM | POA: Diagnosis present

## 2016-06-24 DIAGNOSIS — Z87891 Personal history of nicotine dependence: Secondary | ICD-10-CM | POA: Diagnosis not present

## 2016-06-24 DIAGNOSIS — E785 Hyperlipidemia, unspecified: Secondary | ICD-10-CM | POA: Diagnosis present

## 2016-06-24 DIAGNOSIS — N182 Chronic kidney disease, stage 2 (mild): Secondary | ICD-10-CM | POA: Diagnosis present

## 2016-06-24 DIAGNOSIS — E1122 Type 2 diabetes mellitus with diabetic chronic kidney disease: Secondary | ICD-10-CM | POA: Diagnosis present

## 2016-06-24 DIAGNOSIS — F329 Major depressive disorder, single episode, unspecified: Secondary | ICD-10-CM | POA: Diagnosis present

## 2016-06-24 DIAGNOSIS — Z7982 Long term (current) use of aspirin: Secondary | ICD-10-CM | POA: Diagnosis not present

## 2016-06-24 LAB — COMPREHENSIVE METABOLIC PANEL
ALK PHOS: 120 U/L (ref 38–126)
ALT: 20 U/L (ref 14–54)
ANION GAP: 10 (ref 5–15)
AST: 24 U/L (ref 15–41)
Albumin: 3.9 g/dL (ref 3.5–5.0)
BILIRUBIN TOTAL: 0.9 mg/dL (ref 0.3–1.2)
BUN: 9 mg/dL (ref 6–20)
CALCIUM: 9.4 mg/dL (ref 8.9–10.3)
CO2: 27 mmol/L (ref 22–32)
CREATININE: 0.99 mg/dL (ref 0.44–1.00)
Chloride: 104 mmol/L (ref 101–111)
GFR calc non Af Amer: 58 mL/min — ABNORMAL LOW (ref >60)
GLUCOSE: 211 mg/dL — AB (ref 65–99)
Potassium: 3.3 mmol/L — ABNORMAL LOW (ref 3.5–5.1)
Sodium: 141 mmol/L (ref 135–145)
TOTAL PROTEIN: 7.5 g/dL (ref 6.5–8.1)

## 2016-06-24 LAB — ECHOCARDIOGRAM COMPLETE
Height: 62 in
Weight: 2232.82 oz

## 2016-06-24 LAB — CBC
HEMATOCRIT: 48.8 % — AB (ref 36.0–46.0)
HEMOGLOBIN: 16.1 g/dL — AB (ref 12.0–15.0)
MCH: 30.1 pg (ref 26.0–34.0)
MCHC: 33 g/dL (ref 30.0–36.0)
MCV: 91.2 fL (ref 78.0–100.0)
Platelets: 259 10*3/uL (ref 150–400)
RBC: 5.35 MIL/uL — ABNORMAL HIGH (ref 3.87–5.11)
RDW: 13.9 % (ref 11.5–15.5)
WBC: 6.1 10*3/uL (ref 4.0–10.5)

## 2016-06-24 LAB — LIPID PANEL
CHOL/HDL RATIO: 5.8 ratio
CHOLESTEROL: 209 mg/dL — AB (ref 0–200)
HDL: 36 mg/dL — AB (ref >40)
LDL Cholesterol: 110 mg/dL — ABNORMAL HIGH (ref 0–99)
TRIGLYCERIDES: 316 mg/dL — AB (ref <150)
VLDL: 63 mg/dL — AB (ref 0–40)

## 2016-06-24 LAB — GLUCOSE, CAPILLARY
GLUCOSE-CAPILLARY: 125 mg/dL — AB (ref 65–99)
Glucose-Capillary: 129 mg/dL — ABNORMAL HIGH (ref 65–99)
Glucose-Capillary: 120 mg/dL — ABNORMAL HIGH (ref 65–99)
Glucose-Capillary: 156 mg/dL — ABNORMAL HIGH (ref 65–99)

## 2016-06-24 MED ORDER — ASPIRIN EC 325 MG PO TBEC
325.0000 mg | DELAYED_RELEASE_TABLET | Freq: Every day | ORAL | Status: DC
  Administered 2016-06-24 – 2016-06-25 (×2): 325 mg via ORAL
  Filled 2016-06-24 (×2): qty 1

## 2016-06-24 NOTE — Progress Notes (Signed)
SLP Cancellation Note  Patient Details Name: Erin Hamilton MRN: [id number] DOB: 1950-03-04   Cancelled treatment:       Reason Eval/Treat Not Completed: Patient at procedure or test/unavailable. Pt currently undergoing in-room testing. Will continue efforts.  Stevie Charter B. Quentin Ore [facility], CCC-SLP [phone]  Shonna Chock 06/24/2016, 2:13 PM

## 2016-06-24 NOTE — H&P (Signed)
Internal Medicine Attending Admission Note  I saw and evaluated the patient. I reviewed the resident's note and I agree with the resident's findings and plan as documented in the resident's note.  Assessment & Plan by Problem:   Aphasia and Cognitive dysfunction due to acute cerebrovascular accident (CVA)  ([place]) due to atherosclerotic plaque of the MCA,  Hemiparesis affecting left side as late effect of cerebrovascular accident [facility]) - MRI confirms multiple small acute/subacute infacarcts of the left MCA territory and extenisve cerebrovascular disease.  She has a history of right endareterectomy, her left ICA appears to have 60% stenosis.  Also of note she has old infarction in the right MCA territory that has resulted in her chronic left hemiparesis. - Her LDL is not at goal, it is somewhat difficult to assess if she is truly taking Atorvastatin 80mg  daily, we will try to obtain collateral information from daughter and possibly pharmacy refill history.  We should shoot for a goal LDL <70, she may need addition of ezetimibe to get to this goal. - It appears she was already on ASA and plavix for secondary stroke prevention, I do not see any history of heart disease, will defer to neurology for antiplatelt recommendations going forward. -PT, OT, SLP evaluations    Type 2 diabetes mellitus with hyperglycemia, without long-term current use of insulin ([place]) - On metformin only as outpatient, was mildly hyperglycemic on presentation.  A1c is currently pending.    Hyperlipidemia - Continue atorvastatin 80, goal LDL <70, may need ezetimibe    Chief Complaint(s):difficulty speaking  History - key components related to admission:  Briefly Erin Hamilton is a 66 yo female with an extensive past medical history that includes CVA in 2012 with resultant left-sided weakness, hypertension, diabetes, hyperlipidemia.  She presented to [facility] emergency department yesterday with acute onset of expressive  aphasia. She reports the onset of that was around 11 AM yesterday. She notes a chronic left-sided weakness that is a result of her previous CVA.  She denies any new weakness.  For me today she is not the best historian, she appears to grossly understand my questions she has some trouble dressing the answers however I also have some concern that there is some cognitive dysfunction and she may not be fully understanding me.  I tried to assess if she is currently taking her medications. She appears to be saying that she takes atorvastatin metformin, aspirin, Plavix, and lisinopril, but she both says that she takes it daily as well as that she takes it weekly. She does report she lives alone but her daughter does check in on her.  Surgical history right endarterectomy, tubal ligation Lab results: Reviewed in Epic  Physical Exam - key components related to admission: General: resting in bed HEENT: PERRL, EOMI, no scleral icterus Cardiac: RRR, no rubs, murmurs or gallops Pulm: clear to auscultation bilaterally, moving normal volumes of air Abd: soft, nontender, nondistended, BS present Ext: warm and well perfused, no pedal edema Neuro: alert and oriented X3, she has a mild left facial droop, mildly decreased strength in LUE and decreased left grip strength, strength is 5/5 in LLE, RLE and RUE.   She is dysarthric.  She displays some trouble with associations with inability to calculate simple math problem, she was able to relay that both train and bike have wheels but unable to give similarity between Dog and Cat, she was unable to follow instructions for finger to nose testing.  Vitals:   06/24/16 0400  06/24/16 0600 06/24/16 0800 06/24/16 1000  BP: (!) 92/57 111/80 109/83 128/66  Pulse: 63 78 78 68  Resp: 18 18 18 18   Temp:  97.9 F (36.6 C) 98.1 F (36.7 C) 98.4 F (36.9 C)  TempSrc:  Oral Oral Oral  SpO2: 99% 98% 100% 100%  Weight:      Height:

## 2016-06-24 NOTE — Evaluation (Signed)
Speech Language Pathology Evaluation Patient Details Name: DAEJAH KLEBBA MRN: [id number] DOB: 12/14/1950 Today's Date: 06/24/2016 Time: 1245-8099 SLP Time Calculation (min) (ACUTE ONLY): 35 min  Problem List:  Patient Active Problem List   Diagnosis Date Noted  . Hemiparesis affecting left side as late effect of cerebrovascular accident ([place]) 06/24/2016  . Cognitive dysfunction due to acute cerebrovascular accident (CVA) ([place]) 06/23/2016  . Diabetes mellitus without complication ([place]) 83/38/2505  . Essential hypertension, benign 10/10/2014  . Back pain with left-sided sciatica 10/10/2014  . Acute anxiety 10/09/2014  . CVA (cerebral infarction) 10/09/2014  . Hyperlipidemia 10/09/2014  . Depression 10/09/2014  . Hypertensive CKD (chronic kidney disease) 10/09/2014  . Type 2 diabetes mellitus with hyperglycemia, without long-term current use of insulin ([place]) 09/22/2009  . ACUTE SINUSITIS, UNSPECIFIED 09/22/2009   Past Medical History:  Past Medical History:  Diagnosis Date  . Anxiety   . CVA (cerebral infarction)   . Diabetes mellitus without complication ([place])   . Hyperlipidemia   . Hypertension   . Stroke [facility])    Past Surgical History:  Past Surgical History:  Procedure Laterality Date  . TUBAL LIGATION     HPI:  66 year old female admitted 06/23/16 due to sudden onset expressive aphasia. PMH significant for HTN, DM, R CVA (2012) with left side deficits. MRI reveals L MCA infarct and large chronic R MCA infarct.   Assessment / Plan / Recommendation Clinical Impression  Pt presents with intact receptive language skills and basic expressive language skills. Complex naming tasks are problematic (naming category members, providing multiple meanings to a word, describing a word without naming). Pt speech is mild-moderately dysarthric. Intelligibility is improved significantly with slow rate and overarticulation. No evidence of oral or verbal apraxia. Continued speech therapy  intervention is recommended via home health. Pt was given assignment to suck thick items (pudding, yogurt, thick milkshake) through a straw to increase tongue strength. ST to follow acutely, but pt anticipates discharge tomorrow.     SLP Assessment  SLP Recommendation/Assessment: All further Speech Language Pathology  needs can be addressed in the next venue of care SLP Visit Diagnosis: Dysarthria and anarthria (R47.1)    Follow Up Recommendations  Home health SLP    Frequency and Duration  defer to home health SLP. Will follow acutely 1-2x/week         SLP Evaluation Cognition  Overall Cognitive Status: Within Functional Limits for tasks assessed Arousal/Alertness: Awake/alert Orientation Level: Oriented X4       Comprehension  Auditory Comprehension Overall Auditory Comprehension: Appears within functional limits for tasks assessed Yes/No Questions: Within Functional Limits Commands: Within Functional Limits Conversation: Simple EffectiveTechniques: Slowed speech;Stressing words Visual Recognition/Discrimination Discrimination: Not tested Reading Comprehension Reading Status: Not tested    Expression Expression Primary Mode of Expression: Verbal Verbal Expression Overall Verbal Expression: Impaired Initiation: No impairment Level of Generative/Spontaneous Verbalization: Conversation Repetition: No impairment Naming: Impairment Responsive: 76-100% accurate Confrontation: Within functional limits Convergent: 50-74% accurate Divergent: 50-74% accurate Other Naming Comments: high level naming deficits - providing different definitions of the same word, describe without naming Verbal Errors: Aware of errors Pragmatics: No impairment Interfering Components: Speech intelligibility Written Expression Dominant Hand: Right Written Expression: Not tested   Oral / Motor  Oral Motor/Sensory Function Overall Oral Motor/Sensory Function: Moderate impairment Motor  Speech Overall Motor Speech: Impaired Respiration: Within functional limits Phonation: Normal Resonance: Within functional limits Articulation: Impaired Level of Impairment: Conversation Intelligibility: Intelligibility reduced Word: 75-100% accurate Phrase: 75-100% accurate Sentence: 75-100% accurate  Conversation: 75-100% accurate Motor Planning: Witnin functional limits Motor Speech Errors: Not applicable Interfering Components: Inadequate dentition Effective Techniques: Slow rate;Over-articulate   GO          Functional Assessment Tool Used: asha noms, clinical judgment Functional Limitations: Motor speech Motor Speech Current Status [phone]): At least 20 percent but less than 40 percent impaired, limited or restricted Motor Speech Goal Status [phone]): At least 20 percent but less than 40 percent impaired, limited or restricted         Jackelin Correia B. Quentin Ore [facility], CCC-SLP [phone]  Shonna Chock 06/24/2016, 3:39 PM

## 2016-06-24 NOTE — Progress Notes (Signed)
STROKE TEAM PROGRESS NOTE   HISTORY OF PRESENT ILLNESS (per record) Erin Hamilton is an 66 y.o. female who was on the phone with her brother today at 11:00 when he noted she was having difficulty getting her words out and seems to be slurring her speech (LKW 06/23/2016 at 1100). To him he did not feel she was acting right. EMS was called. They felt she improved slightly on way in but still had expressive aphasia. She has a residual left facial droop from old stroke right hemispheric stroke. Modified Rankin: 2.  CT head was obtained showing no stroke. She is out of window for tPA. Due to question of large vessel occlusion and good Cr will obtain CTA head and neck (neg for LVO, B MCA stenosis). She was admitted for further evaluation and treatment.   SUBJECTIVE (INTERVAL HISTORY) Pt daughter and granddaughter are at the bedside. Pt still has moderate to severe dysarthria with paraphasic errors. No significant right sided weakness. Residue left sided weakness from stroke 5 years ago.    OBJECTIVE Temp:  [97.9 F (36.6 C)-98.8 F (37.1 C)] 98.1 F (36.7 C) (04/25 0800) Pulse Rate:  [63-94] 78 (04/25 0800) Cardiac Rhythm: Normal sinus rhythm (04/25 0700) Resp:  [14-20] 18 (04/25 0800) BP: (92-216)/(57-102) 109/83 (04/25 0800) SpO2:  [96 %-100 %] 100 % (04/25 0800) Weight:  [63.3 kg (139 lb 8.8 oz)-67.7 kg (149 lb 4 oz)] 63.3 kg (139 lb 8.8 oz) (04/24 2000)  CBC:  Recent Labs Lab 06/23/16 1555 06/23/16 1556 06/24/16 0905  WBC 7.2  --  6.1  NEUTROABS 3.9  --   --   HGB 14.4 15.6* 16.1*  HCT 45.2 46.0 48.8*  MCV 90.6  --  91.2  PLT 228  --  096    Basic Metabolic Panel:  Recent Labs Lab 06/23/16 1555 06/23/16 1556 06/24/16 0905  NA 140 141 141  K 3.6 3.6 3.3*  CL 106 106 104  CO2 25  --  27  GLUCOSE 141* 144* 211*  BUN 10 12 9   CREATININE 0.95 1.00 0.99  CALCIUM 9.4  --  9.4    Lipid Panel:    Component Value Date/Time   CHOL 209 (H) 06/24/2016 0905   CHOL 200 (H)  07/05/2015 1434   CHOL 156 10/10/2014 1121   TRIG 316 (H) 06/24/2016 0905   TRIG 221 (H) 10/10/2014 1121   HDL 36 (L) 06/24/2016 0905   HDL 44 07/05/2015 1434   CHOLHDL 5.8 06/24/2016 0905   VLDL 63 (H) 06/24/2016 0905   VLDL 44 (H) 10/10/2014 1121   LDLCALC 110 (H) 06/24/2016 0905   LDLCALC 105 (H) 07/05/2015 1434   HgbA1c: No results found for: HGBA1C Urine Drug Screen: No results found for: LABOPIA, COCAINSCRNUR, LABBENZ, AMPHETMU, THCU, LABBARB  Alcohol Level No results found for: [place] I have personally reviewed the radiological images below and agree with the radiology interpretations. Red text is my interpretation   Ct Head Code Stroke W/o Cm 06/23/2016 1. Old right MCA territory infarction. Chronic small-vessel ischemic changes of both hemispheres. No acute finding. 2. ASPECTS is 10   Ct Angio Head W Or Wo Contrast Ct Angio Neck W Or Wo Contrast 06/23/2016 Extensive atherosclerotic disease at the carotid bifurcation and proximal ICA on the left with primarily calcified plaque. 60% stenosis of the ICA at the distal bulb. 50% stenosis in the carotid siphon region with extensive peripheral calcification. 75% or greater stenosis of the distal M1 segment on the  left. Stenoses of proximal M2 branches as well. No definite missing vessel or cut off vessel in the left MCA territory. Extensive soft plaque affecting the distal right common carotid artery, carotid bifurcation and proximal internal carotid artery. Maximal stenosis at the distal bulb is 50%. Extensive soft plaque would place the patient at risk of embolic disease. Calcification of the carotid siphon region with stenosis of 50%. Serial severe stenoses of the middle cerebral artery M1 M2 regions consistent with previous infarction in that region. Dominant left vertebral artery shows atherosclerotic disease at its origin and in the proximal 2 cm with calcified plaque and irregular narrowing of the lumen. Stenosis is estimated  at 50%. Beyond that, the vessel is widely patent to the basilar. Non dominant right vertebral artery shows severe atherosclerotic disease affecting its proximal 3 cm with serial severe stenoses, 70% or greater. Beyond that, the vessel is patent supplying right PICA and contributing to basilar. On my calculation, right ICA and left ICA about 50% stenosis.  Mr Brain Wo Contrast 06/23/2016 1. Several small cortical acute/early subacute infarcts are present in the left MCA distribution in greatest concentration within the left superolateral frontal lobe. No associated hemorrhage identified. 2. Large chronic right MCA infarction, moderate chronic microvascular ischemic changes of the brain, and moderate brain parenchymal volume loss.   TTE pending   PHYSICAL EXAM  Temp:  [97.9 F (36.6 C)-98.8 F (37.1 C)] 98.6 F (37 C) (04/25 1443) Pulse Rate:  [63-85] 76 (04/25 1443) Resp:  [14-20] 18 (04/25 1000) BP: (92-173)/(57-100) 147/81 (04/25 1443) SpO2:  [97 %-100 %] 100 % (04/25 1443) Weight:  [139 lb 8.8 oz (63.3 kg)] 139 lb 8.8 oz (63.3 kg) (04/24 2000)  General - Well nourished, well developed, in no apparent distress.  Ophthalmologic - Fundi not visualized due to noncooperation.  Cardiovascular - Regular rate and rhythm.  Mental Status -  Level of arousal and orientation to time, place, and person were intact. Language including expression, naming, repetition, comprehension was assessed and found intact except moderate to severe dysarthria with paraphasic errors.  Cranial Nerves II - XII - II - Visual field intact OU. III, IV, VI - Extraocular movements intact. V - Facial sensation intact bilaterally. VII - Facial movement intact bilaterally. VIII - Hearing & vestibular intact bilaterally. X - Palate elevates symmetrically. XI - Chin turning & shoulder shrug intact bilaterally. XII - Tongue protrusion intact.  Motor Strength - The patient's strength was normal in RUE and RLE, but  LUE 4/5 and LLE 5/5 proximal and 4/5 DF (chronic).  Bulk was normal and fasciculations were absent.   Motor Tone - Muscle tone was assessed at the neck and appendages and was increased at LUE.  Reflexes - The patient's reflexes were 1+ in all extremities and she had no pathological reflexes.  Sensory - Light touch, temperature/pinprick were assessed and were decreased on the left, about 70-80% of the right (chronic).    Coordination - The patient had normal movements in the hands with no ataxia or dysmetria.  Tremor was absent.  Gait and Station - not tested    ASSESSMENT/PLAN Erin Hamilton is a 66 y.o. female with history of previous stroke, HTN, HLD, DM presenting with difficulty getting her words out and slurred speech. She did not receive IV t-PA due to delay in arrival.   Stroke:   Small scattered L MCA territory infarcts in setting of extensive atherosclerosis, infarcts secondary to large vessel disease source.  Resultant  Baseline  left sided residue  Code Stroke CT no acute stroke. old R MCA infarct. small vessel disease. Aspects 10.    CTA head / neck extensive atherosclerosis. L ICA 50% distal, 50% siphon. > 75% distal M1. Extensive plauge R CCA/ICA w/ 50% distal bulb, 50% siphon. >/=70% BA  MRI  L MCA several small scattered cortical infarcts. No hmg. Old R MCA infarct.  2D Echo  Pending  Due to hx of stroke on the right and current stroke on the left, recommend 30 day cardiac event monitoring as outpt to rule out afib.   LDL 110  HgbA1c pending  Lovenox 40 mg sq daily for VTE prophylaxis  Diet Carb Modified Fluid consistency: Thin; Room service appropriate? Yes  aspirin 81 mg daily and clopidogrel 75 mg daily prior to admission, now on aspirin 325 mg daily and clopidogrel 75 mg daily. Continue DAPT for 3 months and then plavix along due to intracranial stenosis.   Patient counseled to be compliant with her antithrombotic medications  Ongoing aggressive stroke  risk factor management  Therapy recommendations:  HH PT and OT  Disposition:  pending   Carotid stenosis, bilateral  Right carotid stenosis s/p right CEA  Now L ICA 50% distal, 50% siphon. > 75% distal M1. Extensive plauge R CCA/ICA w/ 50% distal bulb, 50% siphon.   On DAPT for 3 months and then plavix alone  Long term BP goal 130-150   recommend outpt follow up with vascular surgery for carotid stenosis monitoring  Hx of stroke   Right MCA large infarct in 2012  Due to right ICA stenosis  s/p right CEA  On DAPT PTA  Hypertension  BP range from 92/57 to as high as 216/102  BP variable  Stable this am Permissive hypertension (OK if < 220/120) but gradually normalize in 5-7 days Long-term BP goal 130-150 due to multifocal intra and extracranial stenosis  Hyperlipidemia  Home meds:  lipitor 80, resumed in hospital  LDL 110, goal < 70  Continue statin at discharge  Diabetes  HgbA1c pending, goal < 7.0  Other Stroke Risk Factors  Advanced age  Family hx stroke (father)  PVD, hx R CEA  Other Active Problems  Depression   Hospital day # 0  Neurology will sign off. Please call with questions. Pt will follow up with stroke clinic at [facility] in about 6 weeks. Thanks for the consult.  Rosalin Hawking, MD PhD Stroke Neurology 06/24/2016 5:14 PM   To contact Stroke Continuity provider, please refer to [email or web address] After hours, contact General Neurology

## 2016-06-24 NOTE — Evaluation (Signed)
Occupational Therapy Evaluation Patient Details Name: Erin Hamilton MRN: [id number] DOB: 1950/04/18 Today's Date: 06/24/2016    History of Present Illness Pt is a 66 y.o. female who presented to the ED with new onset expressive aphasia. Pt with history of previous CVA in 2012 with L sided deficits. Pt has a OMH significant for anxiety, CVA, diabetes mellitus without complication, hyperlipidemia, and hypertension.   Clinical Impression   PTA, pt was living alone and completing basic ADL independently. She does not cook but completes cleaning and laundry tasks independently as well. She reports intermittent use of cane for mobility. Pt currently requires up to min assist with toilet transfers and min guard assist for LB ADL to improve safety. Educated pt on benefits of use of cane to improve safety at home and she verbalizes understanding. Pt presents with  expressive communication deficits making it difficult to fully assess cognition but feel she is functioning close to baseline. She does demonstrate some intermittent difficulty following multi-step commands but is able to problem solve well during basic ADL tasks. Pt would benefit from continued OT services while admitted to maximize independence with ADL and functional mobility. Recommend home health OT services post-acute D/C for OT follow-up.    Follow Up Recommendations  Home health OT;Supervision/Assistance - 24 hour    Equipment Recommendations  Tub/shower seat    Recommendations for Other Services       Precautions / Restrictions Precautions Precautions: None Restrictions Weight Bearing Restrictions: No      Mobility Bed Mobility Overal bed mobility: Needs Assistance Bed Mobility: Supine to Sit     Supine to sit: Supervision     General bed mobility comments: No physical assistance necessary  Transfers Overall transfer level: Needs assistance Equipment used: 1 person hand held assist Transfers: Sit to/from  Stand Sit to Stand: Min guard         General transfer comment: Min guard assist for safety on ascent. VC's for safety on descent to sitting.    Balance Overall balance assessment: Needs assistance Sitting-balance support: No upper extremity supported;Feet supported Sitting balance-[name] Scale: Good     Standing balance support: Single extremity supported;No upper extremity supported;During functional activity Standing balance-[name] Scale: Fair Standing balance comment: Able to statically stand without UE support during grooming tasks at sink. Required up to min hand held assist while ambulating.                           ADL either performed or assessed with clinical judgement   ADL Overall ADL's : Needs assistance/impaired Eating/Feeding: Set up;Sitting   Grooming: Supervision/safety;Standing   Upper Body Bathing: Set up;Sitting   Lower Body Bathing: Min guard;Sit to/from stand   Upper Body Dressing : Set up;Sitting Upper Body Dressing Details (indicate cue type and reason): Good demonstration of compensatory techniques. Lower Body Dressing: Min guard;Sit to/from stand   Toilet Transfer: Minimal assistance;Ambulation;Min guard Toilet Transfer Details (indicate cue type and reason): Min guard to min handheld assist. Recommended that pt utilize cane at home. Toileting- Water quality scientist and Hygiene: Min guard;Sit to/from stand       Functional mobility during ADLs: Min guard;Minimal assistance General ADL Comments: Pt demonstrates good use of compensatory strategies for ADL due to deficits from previous CVA.      Vision Baseline Vision/History: Wears glasses Wears Glasses: Reading only Patient Visual Report: No change from baseline Vision Assessment?: No apparent visual deficits Additional Comments: Able to read signs in  hallway and utilize peripheral vision functionally.     Perception     Praxis Praxis Praxis tested?: Within functional limits     Pertinent Vitals/Pain Pain Assessment: Faces Faces Pain Scale: Hurts little more Pain Location: L UE at IV Pain Descriptors / Indicators: Grimacing Pain Intervention(s): Monitored during session;Repositioned     Hand Dominance Right   Extremity/Trunk Assessment Upper Extremity Assessment Upper Extremity Assessment: LUE deficits/detail;Generalized weakness LUE Deficits / Details: Residual deficits from previous CVA. Spasticity noted. Unable to fully assess as pt with significant pain and hesitation to move due to IV.   Lower Extremity Assessment Lower Extremity Assessment: Defer to PT evaluation       Communication Communication Communication: Expressive difficulties   Cognition Arousal/Alertness: Awake/alert Behavior During Therapy: WFL for tasks assessed/performed Overall Cognitive Status: No family/caregiver present to determine baseline cognitive functioning                                 General Comments: Pt oriented x4 and follows multi-step commands intermittently. Ability to follow multi-step commands improves with multimodal cues. Difficult to fully assess cognition due to communication deficits but pt likely close to baseline.    General Comments       Exercises     Shoulder Instructions      Home Living Family/patient expects to be discharged to:: Private residence Living Arrangements: Alone Available Help at Discharge: Family;Available 24 hours/day (daughter going to stay with her) Type of Home: House Home Access: Stairs to enter [employer] of Steps: 5 Entrance Stairs-Rails: Can reach both;Right;Left Home Layout: One level     Bathroom Shower/Tub: Tub/shower unit;Curtain   Biochemist, clinical: Standard     Home Equipment: Cane - single point;Bedside commode;Grab bars - tub/shower          Prior Functioning/Environment Level of Independence: Needs assistance  Gait / Transfers Assistance Needed: Uses cane  intermittently ADL's / Homemaking Assistance Needed: Meals on Wheels provides meals for pt. Independent with cleaning and basic ADL.            OT Problem List: Decreased strength;Decreased range of motion;Decreased activity tolerance;Impaired balance (sitting and/or standing);Decreased cognition;Decreased safety awareness;Decreased knowledge of precautions;Impaired UE functional use      OT Treatment/Interventions: Self-care/ADL training;Therapeutic exercise;Energy conservation;DME and/or AE instruction;Cognitive remediation/compensation;Patient/family education;Balance training    OT Goals(Current goals can be found in the care plan section) Acute Rehab OT Goals Patient Stated Goal: to walk OT Goal Formulation: With patient Time For Goal Achievement: 07/08/16 Potential to Achieve Goals: Good ADL Goals Pt Will Perform Grooming: with modified independence;standing Pt Will Transfer to Toilet: with modified independence;ambulating;bedside commode Pt Will Perform Toileting - Clothing Manipulation and hygiene: with modified independence;sit to/from stand Pt Will Perform Tub/Shower Transfer: with modified independence;Tub transfer;3 in 1;shower seat (with cane) Additional ADL Goal #1: Pt will follow 4/5 multi-step commands during morning ADL routine.  OT Frequency: Min 2X/week   Barriers to D/C:            Co-evaluation              End of Session Equipment Utilized During Treatment: Rolling walker;Gait belt Nurse Communication: Mobility status  Activity Tolerance: Patient tolerated treatment well Patient left: in chair;with call bell/phone within reach;with chair alarm set  OT Visit Diagnosis: Unsteadiness on feet (R26.81);Hemiplegia and hemiparesis Hemiplegia - Right/Left: Left Hemiplegia - dominant/non-dominant: Non-Dominant Hemiplegia - caused by: Cerebral infarction  Time: 8979-1504 OT Time Calculation (min): 37 min Charges:  OT General Charges $OT  Visit: 1 Procedure OT Evaluation $OT Eval Moderate Complexity: 1 Procedure OT Treatments $Self Care/Home Management : 8-22 mins G-Codes: OT G-codes **NOT FOR INPATIENT CLASS** Functional Assessment Tool Used: Clinical judgement Functional Limitation: Self care Self Care Current Status (H3643): At least 1 percent but less than 20 percent impaired, limited or restricted Self Care Goal Status (I3779): At least 1 percent but less than 20 percent impaired, limited or restricted   Norman Herrlich, [place] OTR/L  Pager: [place] 06/24/2016, 9:24 AM

## 2016-06-24 NOTE — Evaluation (Signed)
Physical Therapy Evaluation Patient Details Name: Erin Hamilton MRN: [id number] DOB: Oct 25, 1950 Today's Date: 06/24/2016   History of Present Illness  Pt is a 66 y.o. female who presented to the ED with new onset expressive aphasia. CT in ED negative for hemorrhage and CTA showing extensive large vessel disease concerning for increase embolic risk. Pt with history of previous CVA in 2012 with L sided deficits. Pt has a PMH significant for anxiety, CVA, diabetes mellitus without complication, hyperlipidemia, and hypertension.  Clinical Impression  Pt presented sitting OOB in recliner chair, awake and willing to participate in therapy session. Prior to admission, pt reported that she occasionally used her SPC to ambulate. She stated that she receives meals on wheels and was independent with ADLs. Pt ambulated in hallway with close min guard x300'. Pt with no LOB or need for physical assistance. PT encouraged pt to utilize her [facility] at home to improve safety and decrease risk of falls. Pt would continue to benefit from skilled physical therapy services at this time while admitted and after d/c to address the below listed limitations in order to improve overall safety and independence with functional mobility.     Follow Up Recommendations Home health PT;Supervision/Assistance - 24 hour    Equipment Recommendations  None recommended by PT;Other (comment) (pt has SPC at home, PT encouraged her to use it)    Recommendations for Other Services       Precautions / Restrictions Precautions Precautions: None Restrictions Weight Bearing Restrictions: No      Mobility  Bed Mobility Overal bed mobility: Needs Assistance Bed Mobility: Supine to Sit     Supine to sit: Supervision     General bed mobility comments: pt sitting OOB in recliner chair when therapist entered room  Transfers Overall transfer level: Needs assistance Equipment used: None Transfers: Sit to/from Stand Sit to Stand:  Supervision         General transfer comment: good technique, mild instability with rise into standing from chair, supervision for safety  Ambulation/Gait Ambulation/Gait assistance: Min guard Ambulation Distance (Feet): 300 Feet Assistive device: None Gait Pattern/deviations: Step-through pattern;Decreased step length - right;Decreased step length - left;Decreased stride length;Shuffle Gait velocity: decreased Gait velocity interpretation: Below normal speed for age/gender General Gait Details: modest instability with ambulation but no overt LOB or need for physical assistance, close min guard for safety and stability.  Stairs            Wheelchair Mobility    Modified Rankin (Stroke Patients Only)       Balance Overall balance assessment: Needs assistance Sitting-balance support: No upper extremity supported;Feet supported Sitting balance-[name] Scale: Good     Standing balance support: During functional activity;No upper extremity supported Standing balance-[name] Scale: Fair Standing balance comment: increased sway with static standing but no LOB or need for UE supports                             Pertinent Vitals/Pain Pain Assessment: Faces Faces Pain Scale: Hurts little more Pain Location: L UE at IV site Pain Descriptors / Indicators: Grimacing Pain Intervention(s): Monitored during session;Repositioned    Home Living Family/patient expects to be discharged to:: Private residence Living Arrangements: Alone Available Help at Discharge: Family;Available 24 hours/day (daughter going to stay with her) Type of Home: House Home Access: Stairs to enter Entrance Stairs-Rails: Can reach both;Right;Left Entrance Stairs-Number of Steps: 5 Home Layout: One level Home Equipment: Cane - single  point;Bedside commode;Grab bars - tub/shower      Prior Function Level of Independence: Needs assistance   Gait / Transfers Assistance Needed: Uses cane  intermittently  ADL's / Homemaking Assistance Needed: Meals on Wheels provides meals for pt. Independent with cleaning and basic ADL.        Hand Dominance   Dominant Hand: Right    Extremity/Trunk Assessment   Upper Extremity Assessment Upper Extremity Assessment: Defer to OT evaluation LUE Deficits / Details: Residual deficits from previous CVA. Spasticity noted. Unable to fully assess as pt with significant pain and hesitation to move due to IV.    Lower Extremity Assessment Lower Extremity Assessment: RLE deficits/detail;LLE deficits/detail RLE Deficits / Details: MMT revealed 5/5 for knee flexion and extension; 4/5 for hip flexion, hip abduction, hip adduction and ankle DF. Sensation grossly intact and normal. LLE Deficits / Details: MMT revealed 5/5 for knee flexion and extension; 4/5 for hip flexion, hip abduction, hip adduction and ankle DF. Sensation decreased to light touch throughout LE. LLE Sensation: decreased light touch       Communication   Communication: Expressive difficulties  Cognition Arousal/Alertness: Awake/alert Behavior During Therapy: WFL for tasks assessed/performed Overall Cognitive Status: No family/caregiver present to determine baseline cognitive functioning                                 General Comments: Pt following commands appropriately throughout evaluation and able to carry on a meaningful conversation. Expressive difficulties make it difficult to fully determine pt's cognition, but very likely close to baseline.      General Comments      Exercises     Assessment/Plan    PT Assessment Patient needs continued PT services  PT Problem List Decreased strength;Decreased balance;Decreased mobility;Decreased coordination;Decreased knowledge of use of DME;Decreased safety awareness;Impaired sensation       PT Treatment Interventions DME instruction;Gait training;Stair training;Functional mobility training;Therapeutic  activities;Balance training;Therapeutic exercise;Neuromuscular re-education;Patient/family education    PT Goals (Current goals can be found in the Care Plan section)  Acute Rehab PT Goals Patient Stated Goal: to return home PT Goal Formulation: With patient Time For Goal Achievement: 07/08/16 Potential to Achieve Goals: Good    Frequency Min 3X/week   Barriers to discharge        Co-evaluation               End of Session Equipment Utilized During Treatment: Gait belt Activity Tolerance: Patient tolerated treatment well Patient left: in chair;with call bell/phone within reach;with chair alarm set Nurse Communication: Mobility status PT Visit Diagnosis: Other abnormalities of gait and mobility (R26.89);Other symptoms and signs involving the nervous system (R29.898)    Time: 9485-4627 PT Time Calculation (min) (ACUTE ONLY): 20 min   Charges:   PT Evaluation $PT Eval Moderate Complexity: 1 Procedure     PT G Codes:   PT G-Codes **NOT FOR INPATIENT CLASS** Functional Assessment Tool Used: AM-PAC 6 Clicks Basic Mobility;Clinical judgement Functional Limitation: Mobility: Walking and moving around Mobility: Walking and Moving Around Current Status (O3500): At least 20 percent but less than 40 percent impaired, limited or restricted Mobility: Walking and Moving Around Goal Status [phone]): At least 1 percent but less than 20 percent impaired, limited or restricted    [facility], [place], DPT [place] 06/24/2016, 10:28 AM

## 2016-06-24 NOTE — Progress Notes (Addendum)
   Subjective:   No acute events overnight. Patient's dysarthria is improved from yesterday. She is able to speak in intermittent sentences. However, she still has receptive and expressive aphasia and sometimes could not comprehend verbal questions, but was able to comprehend visual cues.   Patient states that she was intermittently taking lisinopril. It is unclear if she was compliant with taking atorvastatin daily - however yesterday during admission, she said that she was taking both atorvastatin and simvastatin daily and showed [place] the bottles.   [facility]- said last fill 90 day on march 26th for atorvastatin Simvastatin - not on file Aspirin - not on file plavix-  90 day shipped march 6th  Lisinopril 10 mg- last shipped Feb 2nd 90 day  Unable to reach daughter   Objective:  Vital signs in last 24 hours: Vitals:   06/24/16 0400 06/24/16 0600 06/24/16 0800 06/24/16 1000  BP: (!) 92/57 111/80 109/83 128/66  Pulse: 63 78 78 68  Resp: 18 18 18 18   Temp:  97.9 F (36.6 C) 98.1 F (36.7 C) 98.4 F (36.9 C)  TempSrc:  Oral Oral Oral  SpO2: 99% 98% 100% 100%  Weight:      Height:       General: Vital signs reviewed. Patient in no acute distress Cardiovascular: regular rate, rhythm, no murmur appreciated  Pulmonary/Chest: Clear to auscultation bilaterally, no wheezes, rales, or rhonchi. Abdominal: Soft, non-tender, non-distended, BS + Extremities: No lower extremity edema bilaterally Skin: Warm, dry and intact. No rashes or erythema.  Focal Neuro exam: Normal sensation in both UE and LE 5/5 in right extremities. 3-4/5 strength in left extremities.  Expressive and receptive aphasia, and some dysarthric speech Cerebellar: unable to perform FNF as could not comprehend. Rapid alternating hand movement normal.   Assessment/Plan:  Active Problems:   CVA (cerebral vascular accident) ([place])  Acute CVA: Patient presented with new left MCA acute/early subacute  infarcts, that resulted in new aphasia and dysarthria and some right sided numbness and she has a large chronic right MCA infarction, and moderate microvascular ischaemic changes of the brain. Lipid panel with LDL of 110, not at goal, high triglycerides 316, and total cholesterol 209. Patient is already on high intensity statin so will continue that and is on DAPT.  -stroke team following and appreciate recs, follow up echo and dopplers -continue aspirin -continue plavix -PT and OT recommending home health  -A1c pending   HTN: on home lisinopril -permissive hypertension and holding home lisinopril  DM: Patient's CBGs are at target at SSI-S -follow up A1c  -SSI-S  HLD -atorva 80  Depression- currently not on any antidepressants -will need outpatient follow up   Diet: CM DVT ppx: heparin  Code - full    Dispo: Anticipated discharge in approximately 1 day(s).  Burgess Estelle, MD 06/24/2016, 10:48 AM

## 2016-06-24 NOTE — Progress Notes (Signed)
  Echocardiogram 2D Echocardiogram has been performed.  Erin Hamilton 06/24/2016, 2:50 PM

## 2016-06-24 NOTE — Care Management Note (Signed)
Case Management Note  Patient Details  Name: Erin Hamilton MRN: [id number] Date of Birth: 12/18/1950  Subjective/Objective:   Pt admitted with CVA. She is from home alone.                  Action/Plan: PT/OT recommends [facility] services. CM following for d/c needs.   Expected Discharge Date:                  Expected Discharge Plan:  [place]  In-House Referral:     Discharge planning Services     Post Acute Care Choice:    Choice offered to:     DME Arranged:    DME Agency:     HH Arranged:    [place] Agency:     Status of Service:  In process, will continue to follow  If discussed at Long Length of Stay Meetings, dates discussed:    Additional Comments:  Pollie Friar, RN 06/24/2016, 10:34 AM

## 2016-06-25 LAB — HEMOGLOBIN A1C
HEMOGLOBIN A1C: 7.5 % — AB (ref 4.8–5.6)
MEAN PLASMA GLUCOSE: 169 mg/dL

## 2016-06-25 LAB — GLUCOSE, CAPILLARY
Glucose-Capillary: 118 mg/dL — ABNORMAL HIGH (ref 65–99)
Glucose-Capillary: 128 mg/dL — ABNORMAL HIGH (ref 65–99)
Glucose-Capillary: 138 mg/dL — ABNORMAL HIGH (ref 65–99)

## 2016-06-25 MED ORDER — EZETIMIBE 10 MG PO TABS
10.0000 mg | ORAL_TABLET | Freq: Every day | ORAL | Status: DC
  Administered 2016-06-25: 10 mg via ORAL
  Filled 2016-06-25: qty 1

## 2016-06-25 MED ORDER — EZETIMIBE 10 MG PO TABS: 10.0000 mg | ORAL_TABLET | Freq: Every day | ORAL | 0 refills | Status: DC

## 2016-06-25 NOTE — Progress Notes (Signed)
Internal Medicine Attending:   I saw and examined the patient. I reviewed the resident's note and I agree with the resident's findings and plan as documented in the resident's note. Ms Erin Hamilton continues to have some expressive aphasia however her cognition appears to be improved.  She is a patient of [place] clinic and reports that it is inconvenient to receive her care here in [place].  She would like to have all of her followup there. She may be discharged today, we will recommend that she continue ASA 81mg  daily and Plavix 75mg  daily for 3 months then she may stop aspirin.  She should continue Atorvastatin 80mg  daily and I would like to add zetia 10mg  to her regimen.  She will need to follow up with Neurology in 6 weeks, this may be at [facility], our neurologist has recommended a 30 day heart monitor to evaluate for arrhythmia however this stroke does appear to be more likely caused by atherosclerosis.  She can have this placed by Cardiolgy at the [facility] clinic.  She will need to have her left Carotid stenosis followed, this could be by her PCP or vascular surgery.

## 2016-06-25 NOTE — Care Management Note (Signed)
Case Management Note  Patient Details  Name: Erin Hamilton MRN: [id number] Date of Birth: 02-Apr-1950  Subjective/Objective:                    Action/Plan: Pt discharging home with [facility] services. PT/OT recommending HH PT/OT but patient refusing. Pt is agreeable to [facility]. CM provided her a list of [employer] agencies and she selected [place]. Brad with [facility] notified and accepted the referral.  Pt states her daughter is going to stay with her for the next several day after d/c.   Expected Discharge Date:  06/25/16               Expected Discharge Plan:  [place]  In-House Referral:     Discharge planning Services  CM Consult  Post Acute Care Choice:  Home Health Choice offered to:  Patient  DME Arranged:    DME Agency:     HH Arranged:  Speech Therapy [place] Agency:  [place]  Status of Service:  Completed, signed off  If discussed at [place] of Stay Meetings, dates discussed:    Additional Comments:  Pollie Friar, RN 06/25/2016, 11:59 AM

## 2016-06-25 NOTE — Consult Note (Signed)
[facility] Inpatient Consult   06/25/2016  Erin Hamilton 03/29/1950 [id number]   Patient was assessed for [place] Management for community services. Patient was previously active with [place] Management about 1 year ago.  Met with patient at bedside regarding being restarted with [facility] services. Consent form active on file signed and a brochure with [place] Management information given. Patient states she did not care for the nurse coming out to see her.  She states, "I will be fine with the calls and a speech therapist.  Explained that the Speech Therapist would come from [facility]. Patient expressed understanding. Spoke with inpatient RNCM, Kelli.  Patient stated to her that she will likely be at her daughter's for a week or so at [place].   Patient tells this Probation officer, "I am going to my home."    Of note, [facility] Care Management services does not replace or interfere with any services that are arranged by inpatient case management or social work. For additional questions or referrals please contact:  Natividad Brood, RN BSN [facility] Liaison  [phone] business mobile phone Toll free office [phone]

## 2016-06-25 NOTE — Progress Notes (Signed)
   Subjective:  Feeling improved. Speaking more clearly, still with some dysarthria. Ready to discharge home, has daughter at home for support.  Objective: Vital signs in last 24 hours: Vitals:   06/24/16 1600 06/24/16 2121 06/25/16 0149 06/25/16 0615  BP: 122/67 125/73 106/68 124/79  Pulse: 88 94 81 78  Resp: 18 18 18 18   Temp: 98.7 F (37.1 C) 98.9 F (37.2 C) 97.7 F (36.5 C) 97.8 F (36.6 C)  TempSrc: Oral Oral Oral Oral  SpO2: 100% 99% 95% 98%  Weight:      Height:       General: Vital signs reviewed. Patient in no acute distress Cardiovascular: regular rate, rhythm, no murmur appreciated  Pulmonary/Chest: Clear to auscultation bilaterally, no wheezes, rales, or rhonchi. Abdominal: Soft, non-tender, non-distended, BS + Extremities: No lower extremity edema bilaterally Skin: Warm, dry and intact. No rashes or erythema.  Focal Neuro exam: Normal sensation in both UE and LE 5/5 in right extremities. 3-4/5 strength in left extremities.  Expressive and receptive aphasia, and some dysarthric speech  Assessment/Plan:  Principal Problem:   Cognitive dysfunction due to acute cerebrovascular accident (CVA) ([place]) Active Problems:   Type 2 diabetes mellitus with hyperglycemia, without long-term current use of insulin (HCC)   Hyperlipidemia   Hemiparesis affecting left side as late effect of cerebrovascular accident [facility])   CVA (cerebral vascular accident) ([place])   Bilateral carotid artery stenosis   H/O carotid endarterectomy  Acute CVA: Acute multifocal left MCA teritory infarcts likely 2/2 extensive atherosclerotic disease. Neuro recommends 30 day holter monitor for afib given old right and new left disease. Also needs 3 month DAPT then plavix alone. Should f/u with Vascular for L ICA stenosis monitoring. SLP therapy on discharge through [facility]. A1c 7.5. - continue DAPT x 3 months, then plavix alone - continue atorva 80mg  - Neuro f/u 6 wks - Vasc Surg f/u - HH SLP  HTN: on  home lisinopril - permissive hypertension, normalize over next 3 days, will restart home lisinopril after discharge  DM: A1c 7.5. -SSI-S  HLD: Lipids above goal of <70 on atorva 80mg . Will add ezitemibe given extensive cerebrovascular atherosclerosis. - atorva 80mg  - add ezitemibe  Depression: currently not on any antidepressants. Consider Lexapro at outpt f/u.  Dispo: Anticipated discharge today.  Holley Raring, MD 06/25/2016, 7:39 AM

## 2016-06-25 NOTE — Progress Notes (Signed)
Pt discharged at this time via wheelchair with ex-husband.  Both the patient and ex-husband verbalize understanding of discharge instructions and medications.  She has her belongings with her including cell phone.  Pt verbalizes understanding of risk factors, s/s of a stroke and need to call 911.

## 2016-06-25 NOTE — Discharge Summary (Signed)
Name: Erin Hamilton MRN: [id number] DOB: 1950-05-18 66 y.o. PCP: [name], NP  Date of Admission: 06/23/2016  3:51 PM Date of Discharge: 06/25/2016 Attending Physician: Lucious Groves, DO  Discharge Diagnosis: Principal Problem:   Cognitive dysfunction due to acute cerebrovascular accident (CVA) [facility]) Active Problems:   Type 2 diabetes mellitus with hyperglycemia, without long-term current use of insulin (HCC)   Hyperlipidemia   Hemiparesis affecting left side as late effect of cerebrovascular accident [facility])   CVA (cerebral vascular accident) ([place])   Bilateral carotid artery stenosis   H/O carotid endarterectomy   Discharge Medications: Allergies as of 06/25/2016      Reactions   Oysters [shellfish Allergy] Anaphylaxis   Penicillins Anaphylaxis   Has patient had a PCN reaction causing immediate rash, facial/tongue/throat swelling, SOB or lightheadedness with hypotension: Yes Has patient had a PCN reaction causing severe rash involving mucus membranes or skin necrosis: No Has patient had a PCN reaction that required hospitalization No Has patient had a PCN reaction occurring within the last 10 years: No If all of the above answers are "NO", then may proceed with Cephalosporin use.   Latex Rash      Medication List    STOP taking these medications   simvastatin 20 MG tablet Commonly known as:  ZOCOR     TAKE these medications   aspirin EC 81 MG tablet Take 81 mg by mouth daily.   atorvastatin 80 MG tablet Commonly known as:  LIPITOR Take 80 mg by mouth daily.   clopidogrel 75 MG tablet Commonly known as:  PLAVIX TAKE 1 TABLET EVERY DAY What changed:  See the new instructions.   ezetimibe 10 MG tablet Commonly known as:  ZETIA Take 1 tablet (10 mg total) by mouth daily. Start taking on:  06/26/2016   gabapentin 400 MG capsule Commonly known as:  NEURONTIN TAKE 1 CAPSULE (400 MG TOTAL) BY MOUTH 3 (THREE) TIMES DAILY. What changed:  See the new  instructions.   glucose blood test strip 1 each by Other route daily. Use as instructed   lisinopril 10 MG tablet Commonly known as:  PRINIVIL,ZESTRIL TAKE 1 TABLET EVERY DAY   metFORMIN 500 MG tablet Commonly known as:  GLUCOPHAGE TAKE 1 TABLET TWICE DAILY BEFORE A MEAL       Disposition and follow-up:   Erin Hamilton was discharged from [facility] in Stable condition.  At the hospital follow up visit please address:  1.  Acute stroke: continue to evaluate improvement with dysarthria/aphasia and need for continue therapy. Assess management of secondary risk factors with lipid control, BG, and antiplatelet therapy  Follow-up Appointments: Follow-up Information    [name], MD. [name] on 07/02/2016.   Specialty:  Internal Medicine Why:  Appointment at 1:30pm for hospital follow up. Contact information: [place] [id number] [address] Hospital Course by problem list: Principal Problem:   Cognitive dysfunction due to acute cerebrovascular accident (CVA) [facility]) Active Problems:   Type 2 diabetes mellitus with hyperglycemia, without long-term current use of insulin (HCC)   Hyperlipidemia   Hemiparesis affecting left side as late effect of cerebrovascular accident [facility])   CVA (cerebral vascular accident) ([place])   Bilateral carotid artery stenosis   H/O carotid endarterectomy   1. Acute CVA: Pt with past h/o right MCA stroke and persistent left sided deficits presented with acute onset of dysarthric speech and  expressive aphasia. CT head was negative and CTA revealed extensive atherosclerotic disease of the major cranial vessels. MRI revealed multifocal left sided deficits most consistent with embolic plaque from atherosclerosis. Pt was previously on dual antiplatelet therapy and atorvastatin 80mg . We recommend adding ezitimibe for further cholesterol control to a goal LDL of <70. Neurology  recommended transitioning from DAPT to plavix alone after 3 months. Given the extent of atherosclerosis and left ICA disease, neurology recommended regular monitoring by Vascular Surgery (of note, pt is s/p right carotid endarterectomy). Neur logy also recommended possible 30-day Holter monitoring for cardiac arhythmia given bi-hemispheric strokes, however, most likely explanation is severe cerebral atherosclerosis. Pt can follow up with Neurology and Cardiology for further evaluation at the discretion of the PCP. Pt had improved in terms of her aphasia at the time of discharge and home health speech therapy was arranged.  Discharge Vitals:   BP 130/71 (BP Location: Right Arm)   Pulse 73   Temp 98.1 F (36.7 C) (Oral)   Resp 20   Ht 5\' 2"  (1.575 m)   Wt 139 lb 8.8 oz (63.3 kg)   LMP  (LMP Unknown)   SpO2 99%   BMI 25.52 kg/m   Pertinent Labs, Studies, and Procedures: Lipid Panel     Component Value Date/Time   CHOL 209 (H) 06/24/2016 0905   TRIG 316 (H) 06/24/2016 0905   HDL 36 (L) 06/24/2016 0905   CHOLHDL 5.8 06/24/2016 0905   VLDL 63 (H) 06/24/2016 0905   LDLCALC 110 (H) 06/24/2016 0905   Procedures Performed:  Ct Angio Head W Or Wo Contrast Ct Angio Neck W Or Wo Contrast Result Date: 06/23/2016 IMPRESSION: Extensive atherosclerotic disease at the carotid bifurcation and proximal ICA on the left with primarily calcified plaque. 60% stenosis of the ICA at the distal bulb. 50% stenosis in the carotid siphon region with extensive peripheral calcification. 75% or greater stenosis of the distal M1 segment on the left. Stenoses of proximal M2 branches as well. No definite missing vessel or cut off vessel in the left MCA territory. Extensive soft plaque affecting the distal right common carotid artery, carotid bifurcation and proximal internal carotid artery. Maximal stenosis at the distal bulb is 50%. Extensive soft plaque would place the patient at risk of embolic disease. Calcification of  the carotid siphon region with stenosis of 50%. Serial severe stenoses of the middle cerebral artery M1 M2 regions consistent with previous infarction in that region. Dominant left vertebral artery shows atherosclerotic disease at its origin and in the proximal 2 cm with calcified plaque and irregular narrowing of the lumen. Stenosis is estimated at 50%. Beyond that, the vessel is widely patent to the basilar. Non dominant right vertebral artery shows severe atherosclerotic disease affecting its proximal 3 cm with serial severe stenoses, 70% or greater. Beyond that, the vessel is patent supplying right PICA and contributing to basilar.   Mr Brain Wo Contrast Result Date: 06/23/2016 IMPRESSION: 1. Several small cortical acute/early subacute infarcts are present in the left MCA distribution in greatest concentration within the left superolateral frontal lobe. No associated hemorrhage identified. 2. Large chronic right MCA infarction, moderate chronic microvascular ischemic changes of the brain, and moderate brain parenchymal volume loss.  Ct Head Code Stroke W/o Cm Result Date: 06/23/2016 IMPRESSION: 1. Old right MCA territory infarction. Chronic small-vessel ischemic changes of both hemispheres. No acute finding. 2. ASPECTS is 10   2D Echo:  Impressions: - Normal LV size with EF 60-65%. Normal RV size  and systolic   function. No significant valvular abnormalities.  Consultations: Neurology - Dr. Erlinda Hong  Discharge Instructions: Discharge Instructions    Call MD for:  extreme fatigue    Complete by:  As directed    Call MD for:  persistant dizziness or light-headedness    Complete by:  As directed    Call MD for:  persistant nausea and vomiting    Complete by:  As directed    Diet - low sodium heart healthy    Complete by:  As directed    Discharge instructions    Complete by:  As directed    You have had a stroke which has affected your ability to speak. It will be helpful to continue to work  with Speech Therapy and we have arranged this for you. We also recommend close follow up with your primary doctor to help prevent risks of future strokes. You should take all you medications as listed on your discharge paper work. You primary doctor may have you follow up with a Neurologist and with a Cardiologist for further evaluation.   Increase activity slowly    Complete by:  As directed       Signed: Holley Raring, MD 06/25/2016, 11:29 AM   Pager: [phone]

## 2016-06-29 ENCOUNTER — Telehealth: Payer: Self-pay | Admitting: Unknown Physician Specialty

## 2016-06-29 NOTE — Telephone Encounter (Signed)
Please let [facility] know she is not an active pt here

## 2016-06-29 NOTE — Telephone Encounter (Signed)
Routing to provider. We have not seen this patient since May 2017. She has an upcoming appointment with internal medicine on 07/29/16.

## 2016-06-29 NOTE — Telephone Encounter (Signed)
Amy with [facility] called and stated that the pt has declined speech therapy. She also states that she agree with the pt.

## 2016-06-30 NOTE — Telephone Encounter (Signed)
Called and let Amy know that the patient has not been seen by [place] in 4 days shy of a year and that it looks like she is seeing another provider.

## 2016-07-02 DIAGNOSIS — I779 Disorder of arteries and arterioles, unspecified: Secondary | ICD-10-CM | POA: Diagnosis not present

## 2016-07-13 ENCOUNTER — Other Ambulatory Visit: Payer: Self-pay

## 2016-07-23 ENCOUNTER — Encounter: Payer: Self-pay | Admitting: Pharmacist

## 2016-07-29 ENCOUNTER — Other Ambulatory Visit: Payer: Self-pay | Admitting: Internal Medicine

## 2016-07-29 DIAGNOSIS — I779 Disorder of arteries and arterioles, unspecified: Secondary | ICD-10-CM | POA: Diagnosis not present

## 2016-07-29 DIAGNOSIS — Z1231 Encounter for screening mammogram for malignant neoplasm of breast: Secondary | ICD-10-CM | POA: Diagnosis not present

## 2016-07-29 DIAGNOSIS — I10 Essential (primary) hypertension: Secondary | ICD-10-CM | POA: Diagnosis not present

## 2016-07-29 DIAGNOSIS — E119 Type 2 diabetes mellitus without complications: Secondary | ICD-10-CM | POA: Diagnosis not present

## 2016-07-29 DIAGNOSIS — E782 Mixed hyperlipidemia: Secondary | ICD-10-CM | POA: Diagnosis not present

## 2016-07-29 DIAGNOSIS — Z0000 Encounter for general adult medical examination without abnormal findings: Secondary | ICD-10-CM | POA: Diagnosis not present

## 2016-07-30 ENCOUNTER — Other Ambulatory Visit: Payer: Self-pay | Admitting: Pharmacist

## 2016-07-30 ENCOUNTER — Other Ambulatory Visit: Payer: Self-pay | Admitting: Internal Medicine

## 2016-07-30 DIAGNOSIS — Z1231 Encounter for screening mammogram for malignant neoplasm of breast: Secondary | ICD-10-CM

## 2016-07-30 NOTE — Patient Outreach (Signed)
[place] [facility]) Care Management  07/30/2016  Erin Hamilton August 08, 1950 [id number]  Unsuccessful phone outreach to patient.  Per referral, patient was referred to [place] by PCP for "help to monitor patient's medication."  HIPAA compliant message was left requesting return call.   Plan:  If no return call, will make second phone outreach attempt next week.   Karrie Meres, PharmD, [place] [phone]

## 2016-08-03 ENCOUNTER — Other Ambulatory Visit: Payer: Self-pay | Admitting: Internal Medicine

## 2016-08-04 ENCOUNTER — Ambulatory Visit: Payer: Self-pay | Admitting: Pharmacist

## 2016-08-05 ENCOUNTER — Other Ambulatory Visit: Payer: Self-pay | Admitting: Pharmacist

## 2016-08-05 NOTE — Patient Outreach (Signed)
[place] [facility]) Care Management  08/05/2016  MARIALIZ FERREBEE October 21, 1950 [id number]  Second unsuccessful phone outreach to patient.  No answer, HIPAA compliant message left requesting return call.   Plan:  If no answer, will make third outreach attempt next week.    Karrie Meres, PharmD, [place] [phone]

## 2016-08-06 ENCOUNTER — Other Ambulatory Visit: Payer: Self-pay | Admitting: Pharmacist

## 2016-08-06 NOTE — Patient Outreach (Signed)
[place] [facility]) Care Management  08/06/2016  Erin Hamilton 07-02-1950 [id number]  Received a voicemail from patient returning [place] call.  Unsuccessful phone outreach to patient. HIPAA compliant message left requesting return call.    Plan:  If no return call, will make outreach next week as previously planned.   Karrie Meres, PharmD, [place] [phone]

## 2016-08-10 ENCOUNTER — Other Ambulatory Visit: Payer: Self-pay | Admitting: Pharmacist

## 2016-08-10 NOTE — Patient Outreach (Signed)
[place] [facility]) Care Management  08/10/2016  Erin Hamilton March 31, 1950 [id number]  Unsuccessful phone outreach to patient.  HIPAA compliant message left requesting return call.   Plan:  If no return call, will make final telephonic outreach attempt in the next week.   Karrie Meres, PharmD, [place] [phone]

## 2016-08-14 ENCOUNTER — Ambulatory Visit
Admission: RE | Admit: 2016-08-14 | Discharge: 2016-08-14 | Disposition: A | Discharge status: Home / Self Care | Payer: Medicare HMO | Source: Ambulatory Visit | Attending: Internal Medicine | Admitting: Internal Medicine

## 2016-08-14 DIAGNOSIS — Z1231 Encounter for screening mammogram for malignant neoplasm of breast: Secondary | ICD-10-CM | POA: Diagnosis not present

## 2016-08-17 ENCOUNTER — Other Ambulatory Visit: Payer: Self-pay | Admitting: Pharmacist

## 2016-08-17 NOTE — Patient Outreach (Signed)
[name]) Care Management  08/17/2016  Erin Hamilton Oct 07, 1950 [id number]  Unsuccessful phone outreach to patient, no answer, HIPAA compliant message left requesting return call.   Three attempts have been made to reach patient, outreach letter will sent to patient.   Plan:  If no reply from patient within 10 business days, will close patient's case.    Karrie Meres, PharmD, [place] [phone]

## 2016-08-31 ENCOUNTER — Other Ambulatory Visit: Payer: Self-pay | Admitting: Pharmacist

## 2016-08-31 ENCOUNTER — Encounter: Payer: Self-pay | Admitting: Pharmacist

## 2016-08-31 NOTE — Patient Outreach (Signed)
[place] [facility]) Care Management  08/31/2016  Erin Hamilton February 02, 1951 [id number]  Greater than 3 phone outreach attempts were made to patient and unsuccessful outreach letter was mailed to patient.    No response from patient.   Plan:  Will close this case due to unable to reach patient.   Will update PCP, Dr Frazier Richards of case closure due to inability to reach patient.    Karrie Meres, PharmD, [place] [phone]

## 2016-10-22 DIAGNOSIS — H524 Presbyopia: Secondary | ICD-10-CM | POA: Diagnosis not present

## 2016-10-27 DIAGNOSIS — I10 Essential (primary) hypertension: Secondary | ICD-10-CM | POA: Diagnosis not present

## 2016-10-27 DIAGNOSIS — Z8673 Personal history of transient ischemic attack (TIA), and cerebral infarction without residual deficits: Secondary | ICD-10-CM | POA: Diagnosis not present

## 2016-10-27 DIAGNOSIS — I779 Disorder of arteries and arterioles, unspecified: Secondary | ICD-10-CM | POA: Diagnosis not present

## 2016-10-27 DIAGNOSIS — E119 Type 2 diabetes mellitus without complications: Secondary | ICD-10-CM | POA: Diagnosis not present

## 2017-02-25 DIAGNOSIS — I779 Disorder of arteries and arterioles, unspecified: Secondary | ICD-10-CM | POA: Diagnosis not present

## 2017-02-25 DIAGNOSIS — I10 Essential (primary) hypertension: Secondary | ICD-10-CM | POA: Diagnosis not present

## 2017-02-25 DIAGNOSIS — E782 Mixed hyperlipidemia: Secondary | ICD-10-CM | POA: Diagnosis not present

## 2017-02-25 DIAGNOSIS — E119 Type 2 diabetes mellitus without complications: Secondary | ICD-10-CM | POA: Diagnosis not present

## 2017-05-07 DIAGNOSIS — B351 Tinea unguium: Secondary | ICD-10-CM | POA: Diagnosis not present

## 2017-05-07 DIAGNOSIS — L03032 Cellulitis of left toe: Secondary | ICD-10-CM | POA: Diagnosis not present

## 2017-05-07 DIAGNOSIS — M79674 Pain in right toe(s): Secondary | ICD-10-CM | POA: Diagnosis not present

## 2017-05-28 DIAGNOSIS — B351 Tinea unguium: Secondary | ICD-10-CM | POA: Diagnosis not present

## 2017-05-28 DIAGNOSIS — L03032 Cellulitis of left toe: Secondary | ICD-10-CM | POA: Diagnosis not present

## 2017-08-04 DIAGNOSIS — Z0000 Encounter for general adult medical examination without abnormal findings: Secondary | ICD-10-CM | POA: Diagnosis not present

## 2017-08-04 DIAGNOSIS — I10 Essential (primary) hypertension: Secondary | ICD-10-CM | POA: Diagnosis not present

## 2017-08-04 DIAGNOSIS — I779 Disorder of arteries and arterioles, unspecified: Secondary | ICD-10-CM | POA: Diagnosis not present

## 2017-08-04 DIAGNOSIS — Z8673 Personal history of transient ischemic attack (TIA), and cerebral infarction without residual deficits: Secondary | ICD-10-CM | POA: Diagnosis not present

## 2017-08-04 DIAGNOSIS — E782 Mixed hyperlipidemia: Secondary | ICD-10-CM | POA: Diagnosis not present

## 2017-08-04 DIAGNOSIS — E119 Type 2 diabetes mellitus without complications: Secondary | ICD-10-CM | POA: Diagnosis not present

## 2017-11-05 ENCOUNTER — Encounter: Payer: Self-pay | Admitting: Emergency Medicine

## 2017-11-05 ENCOUNTER — Inpatient Hospital Stay
Admission: EM | Admit: 2017-11-05 | Discharge: 2017-11-07 | DRG: 065 | Disposition: A | Discharge status: Home / Self Care | Payer: Medicare HMO | Attending: Internal Medicine | Admitting: Internal Medicine

## 2017-11-05 ENCOUNTER — Other Ambulatory Visit: Payer: Self-pay

## 2017-11-05 ENCOUNTER — Emergency Department: Payer: Medicare HMO

## 2017-11-05 DIAGNOSIS — I69354 Hemiplegia and hemiparesis following cerebral infarction affecting left non-dominant side: Secondary | ICD-10-CM | POA: Diagnosis not present

## 2017-11-05 DIAGNOSIS — N390 Urinary tract infection, site not specified: Secondary | ICD-10-CM | POA: Diagnosis not present

## 2017-11-05 DIAGNOSIS — E119 Type 2 diabetes mellitus without complications: Secondary | ICD-10-CM

## 2017-11-05 DIAGNOSIS — F419 Anxiety disorder, unspecified: Secondary | ICD-10-CM | POA: Diagnosis present

## 2017-11-05 DIAGNOSIS — Z91040 Latex allergy status: Secondary | ICD-10-CM

## 2017-11-05 DIAGNOSIS — Z823 Family history of stroke: Secondary | ICD-10-CM

## 2017-11-05 DIAGNOSIS — E1151 Type 2 diabetes mellitus with diabetic peripheral angiopathy without gangrene: Secondary | ICD-10-CM | POA: Diagnosis present

## 2017-11-05 DIAGNOSIS — Z9851 Tubal ligation status: Secondary | ICD-10-CM | POA: Diagnosis not present

## 2017-11-05 DIAGNOSIS — R4781 Slurred speech: Secondary | ICD-10-CM

## 2017-11-05 DIAGNOSIS — I69322 Dysarthria following cerebral infarction: Secondary | ICD-10-CM

## 2017-11-05 DIAGNOSIS — Z880 Allergy status to penicillin: Secondary | ICD-10-CM

## 2017-11-05 DIAGNOSIS — Z8249 Family history of ischemic heart disease and other diseases of the circulatory system: Secondary | ICD-10-CM | POA: Diagnosis not present

## 2017-11-05 DIAGNOSIS — I10 Essential (primary) hypertension: Secondary | ICD-10-CM | POA: Diagnosis present

## 2017-11-05 DIAGNOSIS — Z801 Family history of malignant neoplasm of trachea, bronchus and lung: Secondary | ICD-10-CM

## 2017-11-05 DIAGNOSIS — Z7982 Long term (current) use of aspirin: Secondary | ICD-10-CM

## 2017-11-05 DIAGNOSIS — I69398 Other sequelae of cerebral infarction: Secondary | ICD-10-CM

## 2017-11-05 DIAGNOSIS — Z803 Family history of malignant neoplasm of breast: Secondary | ICD-10-CM | POA: Diagnosis not present

## 2017-11-05 DIAGNOSIS — I6522 Occlusion and stenosis of left carotid artery: Secondary | ICD-10-CM | POA: Diagnosis not present

## 2017-11-05 DIAGNOSIS — Z808 Family history of malignant neoplasm of other organs or systems: Secondary | ICD-10-CM | POA: Diagnosis not present

## 2017-11-05 DIAGNOSIS — Z87891 Personal history of nicotine dependence: Secondary | ICD-10-CM

## 2017-11-05 DIAGNOSIS — Z91013 Allergy to seafood: Secondary | ICD-10-CM

## 2017-11-05 DIAGNOSIS — I129 Hypertensive chronic kidney disease with stage 1 through stage 4 chronic kidney disease, or unspecified chronic kidney disease: Secondary | ICD-10-CM | POA: Diagnosis present

## 2017-11-05 DIAGNOSIS — E1122 Type 2 diabetes mellitus with diabetic chronic kidney disease: Secondary | ICD-10-CM | POA: Diagnosis present

## 2017-11-05 DIAGNOSIS — I63512 Cerebral infarction due to unspecified occlusion or stenosis of left middle cerebral artery: Secondary | ICD-10-CM | POA: Diagnosis not present

## 2017-11-05 DIAGNOSIS — Z7902 Long term (current) use of antithrombotics/antiplatelets: Secondary | ICD-10-CM

## 2017-11-05 DIAGNOSIS — N189 Chronic kidney disease, unspecified: Secondary | ICD-10-CM | POA: Diagnosis present

## 2017-11-05 DIAGNOSIS — I639 Cerebral infarction, unspecified: Secondary | ICD-10-CM | POA: Diagnosis not present

## 2017-11-05 DIAGNOSIS — G9389 Other specified disorders of brain: Secondary | ICD-10-CM | POA: Diagnosis not present

## 2017-11-05 DIAGNOSIS — Z79899 Other long term (current) drug therapy: Secondary | ICD-10-CM

## 2017-11-05 DIAGNOSIS — Z7984 Long term (current) use of oral hypoglycemic drugs: Secondary | ICD-10-CM

## 2017-11-05 DIAGNOSIS — E785 Hyperlipidemia, unspecified: Secondary | ICD-10-CM | POA: Diagnosis present

## 2017-11-05 DIAGNOSIS — F329 Major depressive disorder, single episode, unspecified: Secondary | ICD-10-CM | POA: Diagnosis present

## 2017-11-05 DIAGNOSIS — I6389 Other cerebral infarction: Secondary | ICD-10-CM | POA: Diagnosis not present

## 2017-11-05 LAB — BASIC METABOLIC PANEL
Anion gap: 7 (ref 5–15)
BUN: 21 mg/dL (ref 8–23)
CHLORIDE: 105 mmol/L (ref 98–111)
CO2: 27 mmol/L (ref 22–32)
CREATININE: 1.02 mg/dL — AB (ref 0.44–1.00)
Calcium: 9.1 mg/dL (ref 8.9–10.3)
GFR, EST NON AFRICAN AMERICAN: 56 mL/min — AB (ref >60)
Glucose, Bld: 242 mg/dL — ABNORMAL HIGH (ref 70–99)
Potassium: 3.3 mmol/L — ABNORMAL LOW (ref 3.5–5.1)
Sodium: 139 mmol/L (ref 135–145)

## 2017-11-05 LAB — URINALYSIS, COMPLETE (UACMP) WITH MICROSCOPIC
Bilirubin Urine: NEGATIVE
GLUCOSE, UA: NEGATIVE mg/dL
HGB URINE DIPSTICK: NEGATIVE
Ketones, ur: NEGATIVE mg/dL
NITRITE: POSITIVE — AB
PH: 5 (ref 5.0–8.0)
PROTEIN: NEGATIVE mg/dL
SPECIFIC GRAVITY, URINE: 1.01 (ref 1.005–1.030)

## 2017-11-05 LAB — CBC WITH DIFFERENTIAL/PLATELET
Basophils Absolute: 0 10*3/uL (ref 0–0.1)
Basophils Relative: 1 %
EOS ABS: 0.1 10*3/uL (ref 0–0.7)
Eosinophils Relative: 1 %
HCT: 40 % (ref 35.0–47.0)
HEMOGLOBIN: 13.5 g/dL (ref 12.0–16.0)
Lymphocytes Relative: 41 %
Lymphs Abs: 2.7 10*3/uL (ref 1.0–3.6)
MCH: 31.1 pg (ref 26.0–34.0)
MCHC: 33.9 g/dL (ref 32.0–36.0)
MCV: 91.8 fL (ref 80.0–100.0)
MONOS PCT: 7 %
Monocytes Absolute: 0.5 10*3/uL (ref 0.2–0.9)
NEUTROS PCT: 50 %
Neutro Abs: 3.2 10*3/uL (ref 1.4–6.5)
PLATELETS: 249 10*3/uL (ref 150–440)
RBC: 4.36 MIL/uL (ref 3.80–5.20)
RDW: 14.2 % (ref 11.5–14.5)
WBC: 6.4 10*3/uL (ref 3.6–11.0)

## 2017-11-05 MED ORDER — NITROFURANTOIN MONOHYD MACRO 100 MG PO CAPS
100.0000 mg | ORAL_CAPSULE | Freq: Two times a day (BID) | ORAL | Status: DC
  Administered 2017-11-05: 100 mg via ORAL
  Filled 2017-11-05: qty 1

## 2017-11-05 NOTE — H&P (Addendum)
[facility] [place] NAME: Royetta Probus    MR#:  [id number]  DATE OF BIRTH:  [birth year]  DATE OF ADMISSION:  11/05/2017  PRIMARY CARE PHYSICIAN: Kirk Ruths, MD   REQUESTING/REFERRING PHYSICIAN: Clearnce Hasten, MD  CHIEF COMPLAINT:   Chief Complaint  Patient presents with  . Aphasia    HISTORY OF PRESENT ILLNESS:  Zykera Abella  is a 67 y.o. female who presents with chief complaint as above.  Patient developed slurring of speech this morning while eating.  This persisted throughout the day and she came to the ED for evaluation.  She has a history of prior stroke with residual left-sided symptoms.  MRI imaging in the ED shows new multiple areas of ischemic infarct.  Hospitalist were called for admission  PAST MEDICAL HISTORY:   Past Medical History:  Diagnosis Date  . Anxiety   . CVA (cerebral infarction)   . Diabetes mellitus without complication ([place])   . Hyperlipidemia   . Hypertension   . Stroke [facility])      PAST SURGICAL HISTORY:   Past Surgical History:  Procedure Laterality Date  . TUBAL LIGATION       SOCIAL HISTORY:   Social History   Tobacco Use  . Smoking status: Former Research scientist (life sciences)  . Smokeless tobacco: Never Used  Substance Use Topics  . Alcohol use: No    Alcohol/week: 0.0 standard drinks     FAMILY HISTORY:   Family History  Problem Relation Age of Onset  . Stroke Father   . Heart attack Sister   . Breast cancer Sister   . Cancer Son        throat and lung  . Cancer Sister        gum     DRUG ALLERGIES:   Allergies  Allergen Reactions  . Oysters [Shellfish Allergy] Anaphylaxis  . Penicillins Anaphylaxis    Has patient had a PCN reaction causing immediate rash, facial/tongue/throat swelling, SOB or lightheadedness with hypotension: Yes Has patient had a PCN reaction causing severe rash involving mucus membranes or skin necrosis: No Has patient had a PCN reaction that required  hospitalization No Has patient had a PCN reaction occurring within the last 10 years: No If all of the above answers are "NO", then may proceed with Cephalosporin use.   . Latex Rash    MEDICATIONS AT HOME:   Prior to Admission medications   Medication Sig Start Date End Date Taking? Authorizing Provider  aspirin EC 81 MG tablet Take 81 mg by mouth daily.    [provider]  atorvastatin (LIPITOR) 80 MG tablet Take 80 mg by mouth daily.    [provider]  clopidogrel (PLAVIX) 75 MG tablet TAKE 1 TABLET EVERY DAY Patient taking differently: Take 75 mg by mouth once a day 09/20/15   [name], NP  ezetimibe (ZETIA) 10 MG tablet Take 1 tablet (10 mg total) by mouth daily. 06/26/16   Holley Raring, MD  gabapentin (NEURONTIN) 400 MG capsule TAKE 1 CAPSULE (400 MG TOTAL) BY MOUTH 3 (THREE) TIMES DAILY. Patient taking differently: Take 400 mg by mouth two times a day 04/29/15   [name], NP  glucose blood test strip 1 each by Other route daily. Use as instructed    [provider]  lisinopril (PRINIVIL,ZESTRIL) 10 MG tablet TAKE 1 TABLET EVERY DAY 07/23/15   [name], NP  metFORMIN (GLUCOPHAGE) 500 MG tablet TAKE 1 TABLET TWICE DAILY BEFORE A  MEAL 03/19/15   [name], NP    REVIEW OF SYSTEMS:  Review of Systems  Constitutional: Negative for chills, fever, malaise/fatigue and weight loss.  HENT: Negative for ear pain, hearing loss and tinnitus.   Eyes: Negative for blurred vision, double vision, pain and redness.  Respiratory: Negative for cough, hemoptysis and shortness of breath.   Cardiovascular: Negative for chest pain, palpitations, orthopnea and leg swelling.  Gastrointestinal: Negative for abdominal pain, constipation, diarrhea, nausea and vomiting.  Genitourinary: Negative for dysuria, frequency and hematuria.  Musculoskeletal: Negative for back pain, joint pain and neck pain.  Skin:       No acne, rash, or lesions  Neurological:  Positive for speech change. Negative for dizziness, tremors, focal weakness and weakness.  Endo/Heme/Allergies: Negative for polydipsia. Does not bruise/bleed easily.  Psychiatric/Behavioral: Negative for depression. The patient is not nervous/anxious and does not have insomnia.      VITAL SIGNS:   Vitals:   11/05/17 1847 11/05/17 1849  BP: (!) 113/96   Pulse: 95   Resp: 18   Temp: 98.9 F (37.2 C)   TempSrc: Oral   SpO2: 97%   Weight:  64.4 kg  Height:  5\' 2"  (1.575 m)   Wt Readings from Last 3 Encounters:  11/05/17 64.4 kg  06/23/16 63.3 kg  07/05/15 64 kg    PHYSICAL EXAMINATION:  Physical Exam  Vitals reviewed. Constitutional: She is oriented to person, place, and time. She appears well-developed and well-nourished. No distress.  HENT:  Head: Normocephalic and atraumatic.  Mouth/Throat: Oropharynx is clear and moist.  Eyes: Pupils are equal, round, and reactive to light. Conjunctivae and EOM are normal. No scleral icterus.  Neck: Normal range of motion. Neck supple. No JVD present. No thyromegaly present.  Cardiovascular: Normal rate, regular rhythm and intact distal pulses. Exam reveals no gallop and no friction rub.  No murmur heard. Respiratory: Effort normal and breath sounds normal. No respiratory distress. She has no wheezes. She has no rales.  GI: Soft. Bowel sounds are normal. She exhibits no distension. There is no tenderness.  Musculoskeletal: Normal range of motion. She exhibits no edema.  No arthritis, no gout  Lymphadenopathy:    She has no cervical adenopathy.  Neurological: She is alert and oriented to person, place, and time. No cranial nerve deficit.  Neurologic: Cranial nerves II-XII intact, Sensation intact to light touch/pinprick, 5/5 strength in all extremities except RUE which has chronic weakness from prior stroke, + dysarthria, no aphasia, no dysphagia, memory intact  Skin: Skin is warm and dry. No rash noted. No erythema.  Psychiatric: She has  a normal mood and affect. Her behavior is normal. Judgment and thought content normal.    LABORATORY PANEL:   CBC Recent Labs  Lab 11/05/17 1907  WBC 6.4  HGB 13.5  HCT 40.0  PLT 249   ------------------------------------------------------------------------------------------------------------------  Chemistries  Recent Labs  Lab 11/05/17 1907  NA 139  K 3.3*  CL 105  CO2 27  GLUCOSE 242*  BUN 21  CREATININE 1.02*  CALCIUM 9.1   ------------------------------------------------------------------------------------------------------------------  Cardiac Enzymes No results for input(s): TROPONINI in the last 168 hours. ------------------------------------------------------------------------------------------------------------------  RADIOLOGY:  Ct Head Wo Contrast  Result Date: 11/05/2017 CLINICAL DATA:  Difficulty swallowing after eating a disc this morning with slow speech. EXAM: CT HEAD WITHOUT CONTRAST TECHNIQUE: Contiguous axial images were obtained from the base of the skull through the vertex without intravenous contrast. COMPARISON:  CT and MRI of the head 06/23/2016 FINDINGS: Brain:  Chronic encephalomalacia in the right MCA territory. Chronic small vessel ischemic disease of periventricular white matter. No hydrocephalus, new infarct, hemorrhage or midline shift. No intra-axial mass nor extra-axial fluid collections. Midline fourth ventricle and basal cisterns without effacement. Vascular: Atherosclerosis of the cavernous internal carotids. No hyperdense vessel sign. Skull: Intact Sinuses/Orbits: Nonacute Other: None IMPRESSION: No acute intracranial abnormality. Chronic right MCA territory infarction with encephalomalacia. Chronic small vessel ischemic disease. Electronically Signed   By: Ashley Royalty M.D.   On: 11/05/2017 19:43   Mr Brain Wo Contrast  Result Date: 11/05/2017 CLINICAL DATA:  67 y/o  F; difficulty swallowing since this morning. EXAM: MRI HEAD WITHOUT  CONTRAST TECHNIQUE: Multiplanar, multiecho pulse sequences of the brain and surrounding structures were obtained without intravenous contrast. COMPARISON:  11/05/2017 CT head.  06/23/2016 MRI head. FINDINGS: Brain: Numerous small scattered foci of reduced diffusion compatible with acute/early subacute infarction, throughout the left MCA distribution greatest in the left parietal lobe. No associated hemorrhage or mass effect. Stable large chronic right MCA distribution infarction with wallerian degeneration into the cerebral peduncle in right hemi brainstem. Stable small chronic cortical infarctions in the left MCA distribution as seen on prior MRI. Stable background of chronic microvascular ischemic changes and volume loss of the brain. No extra-axial collection, hydrocephalus, or herniation. Hemosiderin staining of the right MCA distribution chronic infarction. Vascular: Normal flow voids. Skull and upper cervical spine: Normal marrow signal. Sinuses/Orbits: Negative. Other: None. IMPRESSION: 1. Numerous scattered small foci of acute/early subacute infarction throughout the left MCA distribution in greatest concentration in the left parietal lobe. No associated hemorrhage or mass effect. 2. Stable large chronic right MCA and scattered small chronic left MCA distribution infarctions. Stable background of chronic microvascular ischemic changes and volume loss of the brain. These results were called by telephone at the time of interpretation on 11/05/2017 at 10:00 pm to Dr. Larae Grooms , who verbally acknowledged these results. Electronically Signed   By: Kristine Garbe M.D.   On: 11/05/2017 22:03    EKG:   Orders placed or performed during the hospital encounter of 11/05/17  . ED EKG  . ED EKG  . EKG 12-Lead  . EKG 12-Lead    IMPRESSION AND PLAN:  Principal Problem:   Stroke [facility]) -MRI showed multiple areas of focal infarct, will admit per stroke admission order set with appropriate further  imaging, consults, labs.  Permissive hypertension for the first 24 hours after onset of symptoms, blood pressure goal less than 220/120 Active Problems:   UTI (urinary tract infection) -patient is nitrite positive on UA, Levaquin started   Diabetes mellitus without complication (HCC) -sliding scale insulin with corresponding glucose checks   Essential hypertension, benign -hold antihypertensives for now per blood pressure parameters as above   Hyperlipidemia -Home dose antilipid  Chart review performed and case discussed with ED provider. Labs, imaging and/or ECG reviewed by provider and discussed with patient/family. Management plans discussed with the patient and/or family.  DVT PROPHYLAXIS: SubQ lovenox   GI PROPHYLAXIS:  None  ADMISSION STATUS: Inpatient     CODE STATUS: Full Code Status History    Date Active Date Inactive Code Status Order ID Comments User Context   06/23/2016 2007 06/25/2016 2054 Full Code [id number]  Burgess Estelle, MD Inpatient      TOTAL TIME TAKING CARE OF THIS PATIENT: 45 minutes.   Gabby Rackers [place] 11/05/2017, 10:39 PM  [facility]  Office  [phone]  CC: Primary care physician; Kirk Ruths, MD  Note:  This document was prepared using Dragon voice recognition software and may include unintentional dictation errors.

## 2017-11-05 NOTE — ED Notes (Signed)
Pt wheeled to [place]. RN and MD aware.

## 2017-11-05 NOTE — ED Notes (Signed)
ED First Nurse Note:  Patient is complaining of difficulty swallowing after eating a biscuit this morning.  Patient has slow speech and states that is normal for her post stroke that affected her left side approx. 1 year ago.

## 2017-11-05 NOTE — ED Notes (Signed)
Pt to MRI at this time.

## 2017-11-05 NOTE — ED Notes (Signed)
Pt to ct at this time.

## 2017-11-05 NOTE — ED Notes (Signed)
Per Dr. Archie Balboa, no CT at this time. Pt states speech is normal for her post-stroke. Labs as ordered, per Dr. Archie Balboa.

## 2017-11-05 NOTE — ED Notes (Signed)
Provider to bedside

## 2017-11-05 NOTE — ED Provider Notes (Signed)
[facility] Emergency Department Provider Note ____________________________________________   First MD Initiated Contact with Patient 11/05/17 1908     (approximate)  I have reviewed the triage vital signs and the nursing notes.   HISTORY  Chief Complaint Aphasia  HPI Erin Hamilton is a 67 y.o. female with a history of diabetes, CVA and stroke who was presented to the emergency department today with intermittent slurred speech.  She says that she began having slowed speech this morning at approximately 9 AM after eating a biscuit.  However, she states that throughout the day her speech returned to normal.  She is denying any weakness or numbness.  Says she came to the hospital because someone from [employer] was concerned about her speech earlier today.  Says that she does not feel like she is anything stuck in her throat after eating a biscuit.  Patient takes Plavix daily.  Says that she is compliant.  Past Medical History:  Diagnosis Date  . Anxiety   . CVA (cerebral infarction)   . Diabetes mellitus without complication ([place])   . Hyperlipidemia   . Hypertension   . Stroke [facility])     Patient Active Problem List   Diagnosis Date Noted  . Hemiparesis affecting left side as late effect of cerebrovascular accident ([place]) 06/24/2016  . Bilateral carotid artery stenosis   . H/O carotid endarterectomy   . Cognitive dysfunction due to acute cerebrovascular accident (CVA) ([place]) 06/23/2016  . Diabetes mellitus without complication ([place]) 40/98/1191  . Essential hypertension, benign 10/10/2014  . Back pain with left-sided sciatica 10/10/2014  . Acute anxiety 10/09/2014  . Stroke ([place]) 10/09/2014  . Hyperlipidemia 10/09/2014  . Depression 10/09/2014  . Hypertensive CKD (chronic kidney disease) 10/09/2014  . Type 2 diabetes mellitus with hyperglycemia, without long-term current use of insulin ([place]) 09/22/2009  . ACUTE SINUSITIS, UNSPECIFIED 09/22/2009     Past Surgical History:  Procedure Laterality Date  . TUBAL LIGATION      Prior to Admission medications   Medication Sig Start Date End Date Taking? Authorizing Provider  aspirin EC 81 MG tablet Take 81 mg by mouth daily.    [provider]  atorvastatin (LIPITOR) 80 MG tablet Take 80 mg by mouth daily.    [provider]  clopidogrel (PLAVIX) 75 MG tablet TAKE 1 TABLET EVERY DAY Patient taking differently: Take 75 mg by mouth once a day 09/20/15   [name], NP  ezetimibe (ZETIA) 10 MG tablet Take 1 tablet (10 mg total) by mouth daily. 06/26/16   Holley Raring, MD  gabapentin (NEURONTIN) 400 MG capsule TAKE 1 CAPSULE (400 MG TOTAL) BY MOUTH 3 (THREE) TIMES DAILY. Patient taking differently: Take 400 mg by mouth two times a day 04/29/15   [name], NP  glucose blood test strip 1 each by Other route daily. Use as instructed    [provider]  lisinopril (PRINIVIL,ZESTRIL) 10 MG tablet TAKE 1 TABLET EVERY DAY 07/23/15   [name], NP  metFORMIN (GLUCOPHAGE) 500 MG tablet TAKE 1 TABLET TWICE DAILY BEFORE A MEAL 03/19/15   [name], NP    Allergies Oysters [shellfish allergy]; Penicillins; and Latex  Family History  Problem Relation Age of Onset  . Stroke Father   . Heart attack Sister   . Breast cancer Sister   . Cancer Son        throat and lung  . Cancer Sister        gum  Social History Social History   Tobacco Use  . Smoking status: Former Research scientist (life sciences)  . Smokeless tobacco: Never Used  Substance Use Topics  . Alcohol use: No    Alcohol/week: 0.0 standard drinks  . Drug use: No    Review of Systems  Constitutional: No fever/chills Eyes: No visual changes. ENT: No sore throat. Cardiovascular: Denies chest pain. Respiratory: Denies shortness of breath. Gastrointestinal: No abdominal pain.  No nausea, no vomiting.  No diarrhea.  No constipation. Genitourinary: Negative for dysuria. Musculoskeletal: Negative for back  pain. Skin: Negative for rash. Neurological: Negative for headaches, focal weakness or numbness.   ____________________________________________   PHYSICAL EXAM:  VITAL SIGNS: ED Triage Vitals  Enc Vitals Group     BP 11/05/17 1847 (!) 113/96     Pulse Rate 11/05/17 1847 95     Resp 11/05/17 1847 18     Temp 11/05/17 1847 98.9 F (37.2 C)     Temp Source 11/05/17 1847 Oral     SpO2 11/05/17 1847 97 %     Weight 11/05/17 1849 142 lb (64.4 kg)     Height 11/05/17 1849 5\' 2"  (1.575 m)     Head Circumference --      Peak Flow --      Pain Score 11/05/17 1849 0     Pain Loc --      Pain Edu? --      Excl. in [employer]? --     Constitutional: Alert and oriented. Well appearing and in no acute distress. Eyes: Conjunctivae are normal.  Head: Atraumatic. Nose: No congestion/rhinnorhea. Mouth/Throat: Mucous membranes are moist.  Neck: No stridor.   Cardiovascular: Normal rate, regular rhythm. Grossly normal heart sounds.  Good peripheral circulation. Respiratory: Normal respiratory effort.  No retractions. Lungs CTAB. Gastrointestinal: Soft and nontender. No distention. No CVA tenderness. Musculoskeletal: No lower extremity tenderness nor edema.  No joint effusions. Neurologic: Slurred speech for the patient says is baseline.. No gross focal neurologic deficits are appreciated. Skin:  Skin is warm, dry and intact. No rash noted. Psychiatric: Mood and affect are normal. Speech and behavior are normal.  ____________________________________________   LABS (all labs ordered are listed, but only abnormal results are displayed)  Labs Reviewed  BASIC METABOLIC PANEL - Abnormal; Notable for the following components:      Result Value   Potassium 3.3 (*)    Glucose, Bld 242 (*)    Creatinine, Ser 1.02 (*)    GFR calc non Af Amer 56 (*)    All other components within normal limits  URINALYSIS, COMPLETE (UACMP) WITH MICROSCOPIC - Abnormal; Notable for the following components:   Color,  Urine YELLOW (*)    APPearance CLEAR (*)    Nitrite POSITIVE (*)    Leukocytes, UA MODERATE (*)    Bacteria, UA RARE (*)    All other components within normal limits  CBC WITH DIFFERENTIAL/PLATELET   ____________________________________________  EKG  ED ECG REPORT I, Doran Stabler, the attending physician, personally viewed and interpreted this ECG.   Date: 11/05/2017  EKG Time: 2048  Rate: 88  Rhythm: normal sinus rhythm  Axis: Normal  Intervals:none  ST&T Change: No ST segment elevation or depression.  T wave inversions in 3 as well as aVF. No significant change from previous EKG. ____________________________________________  RADIOLOGY  Chronic encephalomalacia from right MCA infarct  Multiple left sided new infarcts.  Distribution of left MCA. ____________________________________________   PROCEDURES  Procedure(s) performed:   Procedures  Critical Care  performed:   ____________________________________________   INITIAL IMPRESSION / ASSESSMENT AND PLAN / ED COURSE  Pertinent labs & imaging results that were available during my care of the patient were reviewed by me and considered in my medical decision making (see chart for details).  Differential diagnosis includes, but is not limited to, alcohol, illicit or prescription medications, or other toxic ingestion; intracranial pathology such as stroke or intracerebral hemorrhage; fever or infectious causes including sepsis; hypoxemia and/or hypercarbia; uremia; trauma; endocrine related disorders such as diabetes, hypoglycemia, and thyroid-related diseases; hypertensive encephalopathy; etc. As part of my medical decision making, I reviewed the following data within the electronic MEDICAL RECORD NUMBER Notes from prior ED visits  ----------------------------------------- 10:16 PM on 11/05/2017 -----------------------------------------  Patient continues to be neuro intact and her baseline with only slightly  slurred speech.  However, new infarcts on the CAT scan.  Also found to have UTI.  Will be admitted to the hospital.  Signed out to Dr. Jannifer Franklin. ____________________________________________   FINAL CLINICAL IMPRESSION(S) / ED DIAGNOSES  Stroke.  UTI.  NEW MEDICATIONS STARTED DURING THIS VISIT:  New Prescriptions   No medications on file     Note:  This document was prepared using Dragon voice recognition software and may include unintentional dictation errors.     Orbie Pyo, MD 11/05/17 2217

## 2017-11-05 NOTE — ED Triage Notes (Signed)
Pt states she ate a sausage biscuit and states it caused her to have "congestion in her throat and has slowed down [her] speech." Pt speaking with slurred speech at this time.   Hx stroke that affected left side.

## 2017-11-06 ENCOUNTER — Inpatient Hospital Stay: Payer: Medicare HMO

## 2017-11-06 ENCOUNTER — Inpatient Hospital Stay
Admit: 2017-11-06 | Discharge: 2017-11-06 | Disposition: A | Discharge status: Home / Self Care | Payer: Medicare HMO | Attending: Internal Medicine | Admitting: Internal Medicine

## 2017-11-06 ENCOUNTER — Encounter: Payer: Self-pay | Admitting: *Deleted

## 2017-11-06 DIAGNOSIS — N390 Urinary tract infection, site not specified: Secondary | ICD-10-CM | POA: Diagnosis present

## 2017-11-06 DIAGNOSIS — I639 Cerebral infarction, unspecified: Secondary | ICD-10-CM

## 2017-11-06 LAB — GLUCOSE, CAPILLARY
Glucose-Capillary: 137 mg/dL — ABNORMAL HIGH (ref 70–99)
Glucose-Capillary: 141 mg/dL — ABNORMAL HIGH (ref 70–99)
Glucose-Capillary: 143 mg/dL — ABNORMAL HIGH (ref 70–99)
Glucose-Capillary: 172 mg/dL — ABNORMAL HIGH (ref 70–99)
Glucose-Capillary: 183 mg/dL — ABNORMAL HIGH (ref 70–99)

## 2017-11-06 LAB — HEMOGLOBIN A1C
Hgb A1c MFr Bld: 7.8 % — ABNORMAL HIGH (ref 4.8–5.6)
Mean Plasma Glucose: 177.16 mg/dL

## 2017-11-06 LAB — LIPID PANEL
Cholesterol: 124 mg/dL (ref 0–200)
HDL: 31 mg/dL — ABNORMAL LOW (ref >40)
LDL CALC: 51 mg/dL (ref 0–99)
Total CHOL/HDL Ratio: 4 RATIO
Triglycerides: 208 mg/dL — ABNORMAL HIGH (ref <150)
VLDL: 42 mg/dL — AB (ref 0–40)

## 2017-11-06 MED ORDER — ACETAMINOPHEN 160 MG/5ML PO SOLN: 650.0000 mg | ORAL | Status: DC | PRN

## 2017-11-06 MED ORDER — ENOXAPARIN SODIUM 40 MG/0.4ML ~~LOC~~ SOLN
40.0000 mg | SUBCUTANEOUS | Status: DC
  Administered 2017-11-06: 22:00:00 40 mg via SUBCUTANEOUS
  Filled 2017-11-06: qty 0.4

## 2017-11-06 MED ORDER — ACETAMINOPHEN 325 MG PO TABS: 650.0000 mg | ORAL_TABLET | ORAL | Status: DC | PRN

## 2017-11-06 MED ORDER — SODIUM CHLORIDE 0.9 % IV SOLN
INTRAVENOUS | Status: DC | PRN
  Administered 2017-11-06: 500 mL via INTRAVENOUS

## 2017-11-06 MED ORDER — INFLUENZA VAC SPLIT HIGH-DOSE 0.5 ML IM SUSY
0.5000 mL | PREFILLED_SYRINGE | INTRAMUSCULAR | Status: DC
  Filled 2017-11-06: qty 0.5

## 2017-11-06 MED ORDER — ASPIRIN EC 81 MG PO TBEC
81.0000 mg | DELAYED_RELEASE_TABLET | Freq: Every day | ORAL | Status: DC
  Administered 2017-11-06 – 2017-11-07 (×2): 81 mg via ORAL
  Filled 2017-11-06 (×2): qty 1

## 2017-11-06 MED ORDER — RAMELTEON 8 MG PO TABS
8.0000 mg | ORAL_TABLET | Freq: Every day | ORAL | Status: DC
  Administered 2017-11-07: 01:00:00 8 mg via ORAL
  Filled 2017-11-06 (×2): qty 1

## 2017-11-06 MED ORDER — SODIUM CHLORIDE 0.9 % IV SOLN: 1.0000 g | INTRAVENOUS | Status: DC

## 2017-11-06 MED ORDER — INSULIN ASPART 100 UNIT/ML ~~LOC~~ SOLN
0.0000 [IU] | Freq: Three times a day (TID) | SUBCUTANEOUS | Status: DC
  Administered 2017-11-06 (×2): 1 [IU] via SUBCUTANEOUS
  Administered 2017-11-06 – 2017-11-07 (×2): 2 [IU] via SUBCUTANEOUS
  Filled 2017-11-06 (×3): qty 1

## 2017-11-06 MED ORDER — LEVOFLOXACIN IN D5W 750 MG/150ML IV SOLN
750.0000 mg | INTRAVENOUS | Status: DC
  Administered 2017-11-06: 750 mg via INTRAVENOUS
  Filled 2017-11-06 (×2): qty 150

## 2017-11-06 MED ORDER — ATORVASTATIN CALCIUM 20 MG PO TABS
80.0000 mg | ORAL_TABLET | Freq: Every day | ORAL | Status: DC
  Administered 2017-11-06 – 2017-11-07 (×2): 80 mg via ORAL
  Filled 2017-11-06 (×2): qty 4

## 2017-11-06 MED ORDER — CLOPIDOGREL BISULFATE 75 MG PO TABS
75.0000 mg | ORAL_TABLET | Freq: Every day | ORAL | Status: DC
  Administered 2017-11-06 – 2017-11-07 (×2): 75 mg via ORAL
  Filled 2017-11-06 (×2): qty 1

## 2017-11-06 MED ORDER — ACETAMINOPHEN 650 MG RE SUPP: 650.0000 mg | RECTAL | Status: DC | PRN

## 2017-11-06 MED ORDER — INSULIN ASPART 100 UNIT/ML ~~LOC~~ SOLN: 0.0000 [IU] | Freq: Every day | SUBCUTANEOUS | Status: DC

## 2017-11-06 MED ORDER — STROKE: EARLY STAGES OF RECOVERY BOOK
Freq: Once | Status: AC
  Administered 2017-11-06: 01:00:00

## 2017-11-06 NOTE — Consult Note (Signed)
STROKE TEAM PROGRESS NOTE    HPI:  67 year old female with a PMHx of multiple strokes, DM, HLD, HTN, anxiety and depression, CKD here because she ate a biscuit and had trouble swallowing. She has baseline dysarthria and left-sided weakness from previous stroke. Denies any new weakness. On plavix and asa daily.   SUBJECTIVE (INTERVAL HISTORY) Her family is not at bedside. Patient wishes to go home. She feels stable. Discussed strokes due to atherosclerosis.    OBJECTIVE Vitals:   11/06/17 0536 11/06/17 0629 11/06/17 0903 11/06/17 1338  BP: (!) 153/60 129/69 127/86 (!) 151/63  Pulse: 84 80 72 (!) 106  Resp: 17 17  20   Temp: 98.2 F (36.8 C)  98.4 F (36.9 C) 98.4 F (36.9 C)  TempSrc: Oral  Oral Oral  SpO2: 99% 98% 100% 97%  Weight:      Height:        CBC:  Recent Labs  Lab 11/05/17 1907  WBC 6.4  NEUTROABS 3.2  HGB 13.5  HCT 40.0  MCV 91.8  PLT 902    Basic Metabolic Panel:  Recent Labs  Lab 11/05/17 1907  NA 139  K 3.3*  CL 105  CO2 27  GLUCOSE 242*  BUN 21  CREATININE 1.02*  CALCIUM 9.1    Lipid Panel:     Component Value Date/Time   CHOL 124 11/06/2017 0531   CHOL 200 (H) 07/05/2015 1434   CHOL 156 10/10/2014 1121   TRIG 208 (H) 11/06/2017 0531   TRIG 221 (H) 10/10/2014 1121   HDL 31 (L) 11/06/2017 0531   HDL 44 07/05/2015 1434   CHOLHDL 4.0 11/06/2017 0531   VLDL 42 (H) 11/06/2017 0531   VLDL 44 (H) 10/10/2014 1121   LDLCALC 51 11/06/2017 0531   LDLCALC 105 (H) 07/05/2015 1434   HgbA1c:  Lab Results  Component Value Date   HGBA1C 7.8 (H) 11/06/2017   Urine Drug Screen: No results found for: LABOPIA, COCAINSCRNUR, LABBENZ, AMPHETMU, THCU, LABBARB  Alcohol Level No results found for: ETH  IMAGING  Ct Head Wo Contrast  Result Date: 11/05/2017 CLINICAL DATA:  Difficulty swallowing after eating a disc this morning with slow speech. EXAM: CT HEAD WITHOUT CONTRAST TECHNIQUE: Contiguous axial images were obtained from the base of the skull  through the vertex without intravenous contrast. COMPARISON:  CT and MRI of the head 06/23/2016 FINDINGS: Brain: Chronic encephalomalacia in the right MCA territory. Chronic small vessel ischemic disease of periventricular white matter. No hydrocephalus, new infarct, hemorrhage or midline shift. No intra-axial mass nor extra-axial fluid collections. Midline fourth ventricle and basal cisterns without effacement. Vascular: Atherosclerosis of the cavernous internal carotids. No hyperdense vessel sign. Skull: Intact Sinuses/Orbits: Nonacute Other: None IMPRESSION: No acute intracranial abnormality. Chronic right MCA territory infarction with encephalomalacia. Chronic small vessel ischemic disease. Electronically Signed   By: Ashley Royalty M.D.   On: 11/05/2017 19:43   Mr Brain Wo Contrast  Result Date: 11/05/2017 CLINICAL DATA:  67 y/o  F; difficulty swallowing since this morning. EXAM: MRI HEAD WITHOUT CONTRAST TECHNIQUE: Multiplanar, multiecho pulse sequences of the brain and surrounding structures were obtained without intravenous contrast. COMPARISON:  11/05/2017 CT head.  06/23/2016 MRI head. FINDINGS: Brain: Numerous small scattered foci of reduced diffusion compatible with acute/early subacute infarction, throughout the left MCA distribution greatest in the left parietal lobe. No associated hemorrhage or mass effect. Stable large chronic right MCA distribution infarction with wallerian degeneration into the cerebral peduncle in right hemi brainstem. Stable small  chronic cortical infarctions in the left MCA distribution as seen on prior MRI. Stable background of chronic microvascular ischemic changes and volume loss of the brain. No extra-axial collection, hydrocephalus, or herniation. Hemosiderin staining of the right MCA distribution chronic infarction. Vascular: Normal flow voids. Skull and upper cervical spine: Normal marrow signal. Sinuses/Orbits: Negative. Other: None. IMPRESSION: 1. Numerous scattered  small foci of acute/early subacute infarction throughout the left MCA distribution in greatest concentration in the left parietal lobe. No associated hemorrhage or mass effect. 2. Stable large chronic right MCA and scattered small chronic left MCA distribution infarctions. Stable background of chronic microvascular ischemic changes and volume loss of the brain. These results were called by telephone at the time of interpretation on 11/05/2017 at 10:00 pm to Dr. Larae Grooms , who verbally acknowledged these results. Electronically Signed   By: Kristine Garbe M.D.   On: 11/05/2017 22:03   US Carotid Bilateral (at [facility] And [facility] Only)  Result Date: 11/06/2017 CLINICAL DATA:  Slurred speech. Previous right carotid endarterectomy. EXAM: BILATERAL CAROTID DUPLEX ULTRASOUND TECHNIQUE: Pearline Cables scale imaging, color Doppler and duplex ultrasound were performed of bilateral carotid and vertebral arteries in the neck. COMPARISON:  CTA neck 06/23/2016 FINDINGS: Criteria: Quantification of carotid stenosis is based on velocity parameters that correlate the residual internal carotid diameter with NASCET-based stenosis levels, using the diameter of the distal internal carotid lumen as the denominator for stenosis measurement. The following velocity measurements were obtained: RIGHT ICA: 31/12 cm/sec CCA: 78/29 cm/sec SYSTOLIC ICA/CCA RATIO:  0.7 ECA: 134 cm/sec LEFT ICA: 138/50 cm/sec CCA: 56/21 cm/sec SYSTOLIC ICA/CCA RATIO:  1.9 ECA: 128 cm/sec RIGHT CAROTID ARTERY: Mildly tortuous. Moderate hypoechoic plaque or thrombus in the bulb extending into proximal ICA without high-grade stenosis. Limited color Doppler signal in the right external carotid artery, with normal waveform demonstrated. RIGHT VERTEBRAL ARTERY:  Normal flow direction and waveform. LEFT CAROTID ARTERY: Eccentric plaque in the bulb and proximal ICA resulting in at least mild stenosis. Mildly elevated peak systolic velocities at the stenosis. Otherwise  normal waveforms and color Doppler signal. LEFT VERTEBRAL ARTERY:  Normal flow direction and waveform. IMPRESSION: 1. Moderate residual/recurrent eccentric plaque or thrombus in the right carotid bulb post endarterectomy, without high-grade stenosis. 2. Proximal left ICA plaque resulting in 50-69% diameter stenosis. 3.  Antegrade bilateral vertebral arterial flow. Electronically Signed   By: Lucrezia Europe M.D.   On: 11/06/2017 09:23   Mr Jodene Nam Head/brain HY Cm  Result Date: 11/06/2017 CLINICAL DATA:  Slurred speech EXAM: MRA HEAD WITHOUT CONTRAST TECHNIQUE: Angiographic images of the Circle of Willis were obtained using MRA technique without intravenous contrast. COMPARISON:  Brain MRI 11/05/2017 Head CTA 06/23/2016 FINDINGS: ANTERIOR CIRCULATION: --Intracranial internal carotid arteries: Normal. --Anterior cerebral arteries: There is severe narrowing of the proximal A1 segments bilaterally. The A2 segments and distal branches of the anterior cerebral arteries are normal. The degree of proximal stenosis appears increased compared to the CTA from 06/23/2016, though MRA may overestimate stenosis relative to CTA. --Middle cerebral arteries: Chronic high-grade stenosis/multifocal occlusion of the right middle cerebral artery is unchanged. There is severe stenosis of the mid M1 segment of the left middle cerebral artery, also unchanged. Distal left MCA branches are patent. --Posterior communicating arteries: Patent on the right. Not seen on the left. POSTERIOR CIRCULATION: --Basilar artery: Normal. --Posterior cerebral arteries: Normal. --Superior cerebellar arteries: Normal. --Inferior cerebellar arteries: Normal anterior and posterior inferior cerebellar arteries. IMPRESSION: 1. Severe chronic multifocal stenosis of the right middle cerebral artery, unchanged  from 06/23/2016, with chronic right MCA territory infarct. 2. Severe stenosis the mid M1 segment of the left middle cerebral artery is unchanged from 06/23/2016,  allowing for differences in technique. 3. Severe stenosis both anterior cerebral artery A1 segments proximally. This appears to have worsened since 06/23/2016, though differences in modality limit the accuracy of the comparison. Electronically Signed   By: Ulyses Jarred M.D.   On: 11/06/2017 04:46       PHYSICAL EXAM  Physical exam: Exam: Gen: NAD, conversant, poorly groomed, adentulous                     CV: RRR, no MRG. No Carotid Bruits. No peripheral edema, warm, nontender Eyes: Conjunctivae clear without exudates or hemorrhage  Neuro: Detailed Neurologic Exam  Speech:    Speech is dysarthris Cognition:    The patient is oriented to person, place, and time;     Cranial Nerves:    The pupils are equal, round, and reactive to light. Fundi not visualized due to non-cooperation.  Visual fields are full to finger confrontation. Extraocular movements are intact. Trigeminal sensation is intact and the muscles of mastication are normal. The face is symmetric. The palate elevates in the midline. Hearing intact. Voice is normal. Shoulder shrug is normal. The tongue has normal motion without fasciculations.   Coordination:    No dysmetria noted  Gait:    deferred  Motor Observation:    No asymmetry, no atrophy, and no involuntary movements noted. Tone:    Normal muscle tone.    Posture:    Posture is normal in bed    Strength: left arm 4/5, left foot DF 4/5 otherwise strength is V/V in the upper and lower limbs.      Sensation: intact to LT     Reflex Exam:  DTR's:    Deep tendon reflexes in the upper and lower extremities are symmetrical bilaterally.   Toes:    The toes are equiv bilaterally.   Clonus:    Clonus is absent.    ASSESSMENT/PLAN Ms. GLORIE DOWLEN is a 67 y.o. female with history of  multiple strokes, DM, HLD, HTN, anxiety and depression, CKD here because she ate a biscuit and had trouble swallowing. She has baseline dysarthria and left-sided weakness  from previous stroke.  Stroke:   Numerous scattered small foci of acute/early subacute infarction throughout the left MCA distribution in greatest concentration in the left parietal lobe. Stable large chronic right MCA and scattered small chronic left MCA distribution infarctions.  MRA head: Severe chronic multifocal stenosis of right MCA, left M1, both ACA.  Stroke likely due to extensive atherosclerosis and secondary to large vessel disease source  Recommend 30-day outpatient heart monitor  Pending echo  Carotids: Moderate residual/recurrent eccentric plaque or thrombus in the  right carotid bulb post endarterectomy, without high-grade stenosis.  Proximal left ICA plaque resulting in 50-69% diameter stenosis.Needs  outpatient vascular follow up.   LDL 51  HgbA1c 7.8 goal < 70  ASA 81 and plavix 75 prior to admission, continue DAPT for 3 months then Plavix alone due to intracranial stenosis  Patient counseled to be compliant with her antithrombotic medications  Hypertension .  Permissive hypertension (OK if < 220/120) but gradually normalize in 5-7 days . Long-term BP goal 130-150 due to extensive atherosclerosis . Needs Followup with vascular surgery for carotid stenosis  Hx of stroke   Right MCA large infarct in 2012  Due to right ICA stenosis  s/p right CEA  On DAPT PTA  Other Stroke Risk Factors  Advanced age  Family hx stroke (father)  PVD, hx R CEA  Screen for OSA  Other Active Problems  Depression  Hospital day # 1  I had a long d/w patient about her recent stroke, risk for recurrent stroke/TIAs, personally independently reviewed imaging studies and stroke evaluation results and answered questions.Continue DUAP for secondary stroke prevention and maintain strict control of hypertension with blood pressure goal below 130-150, diabetes with hemoglobin A1c goal below 7% and lipids with LDL cholesterol goal below 70 mg/dL.I also advised the patient to eat a  healthy diet with plenty of whole grains, cereals, fruits and vegetables, exercise regularly and maintain ideal body weight .Followup outpatient 30-day heart monitor, neurology and vascular surgery for carotid stenosis.  Stroke will sign off

## 2017-11-06 NOTE — Progress Notes (Signed)
PT Cancellation Note  Patient Details Name: Erin Hamilton MRN: [id number] DOB: 01/20/1951   Cancelled Treatment:    Reason Eval/Treat Not Completed: Other (comment)(Patient with doctor from stroke team. ) Patient looking for phone upon PT entering room. Doctor from stroke team entered room. Will attempt again at a future time/date.   Janna Arch, PT, DPT   11/06/2017, 12:51 PM

## 2017-11-06 NOTE — Progress Notes (Signed)
[place] [facility] [place] NAME: Erin Hamilton    MR#:  [id number]  DATE OF BIRTH:  [birth year]  SUBJECTIVE:  CHIEF COMPLAINT:   Chief Complaint  Patient presents with  . Aphasia   -Still having some dysphasia.  No word finding difficulty this morning.  Minimal right-sided weakness on exam.  REVIEW OF SYSTEMS:  Review of Systems  Constitutional: Negative for chills, fever and malaise/fatigue.  HENT: Negative for congestion, ear discharge, hearing loss and nosebleeds.   Eyes: Negative for blurred vision and double vision.  Respiratory: Negative for cough, shortness of breath and wheezing.   Cardiovascular: Negative for chest pain, palpitations and leg swelling.  Gastrointestinal: Negative for abdominal pain, constipation, diarrhea, nausea and vomiting.  Genitourinary: Negative for dysuria.  Musculoskeletal: Negative for myalgias.  Neurological: Positive for speech change and focal weakness. Negative for dizziness, seizures and headaches.  Psychiatric/Behavioral: Negative for depression.    DRUG ALLERGIES:   Allergies  Allergen Reactions  . Oysters [Shellfish Allergy] Anaphylaxis  . Penicillins Anaphylaxis    Has patient had a PCN reaction causing immediate rash, facial/tongue/throat swelling, SOB or lightheadedness with hypotension: Yes Has patient had a PCN reaction causing severe rash involving mucus membranes or skin necrosis: No Has patient had a PCN reaction that required hospitalization No Has patient had a PCN reaction occurring within the last 10 years: No If all of the above answers are "NO", then may proceed with Cephalosporin use.   . Latex Rash    VITALS:  Blood pressure 127/86, pulse 72, temperature 98.4 F (36.9 C), temperature source Oral, resp. rate 17, height 5\' 2"  (1.575 m), weight 66.2 kg, SpO2 100 %.  PHYSICAL EXAMINATION:  Physical Exam  GENERAL:  67 y.o.-year-old patient lying in the bed with no acute  distress.  EYES: Pupils equal, round, reactive to light and accommodation. No scleral icterus. Extraocular muscles intact.  HEENT: Head atraumatic, normocephalic. Oropharynx and nasopharynx clear.  NECK:  Supple, no jugular venous distention. No thyroid enlargement, no tenderness.  LUNGS: Normal breath sounds bilaterally, no wheezing, rales,rhonchi or crepitation. No use of accessory muscles of respiration.  CARDIOVASCULAR: S1, S2 normal. No murmurs, rubs, or gallops.  ABDOMEN: Soft, nontender, nondistended. Bowel sounds present. No organomegaly or mass.  EXTREMITIES: No pedal edema, cyanosis, or clubbing.  NEUROLOGIC: Cranial nerves II through XII are intact.  Slurred speech noted.  Muscle strength 5/5 in all extremities except slightly drift noted on left upper and lower extremities.. Sensation intact. Gait not checked.  PSYCHIATRIC: The patient is alert and oriented x 3.  SKIN: No obvious rash, lesion, or ulcer.    LABORATORY PANEL:   CBC Recent Labs  Lab 11/05/17 1907  WBC 6.4  HGB 13.5  HCT 40.0  PLT 249   ------------------------------------------------------------------------------------------------------------------  Chemistries  Recent Labs  Lab 11/05/17 1907  NA 139  K 3.3*  CL 105  CO2 27  GLUCOSE 242*  BUN 21  CREATININE 1.02*  CALCIUM 9.1   ------------------------------------------------------------------------------------------------------------------  Cardiac Enzymes No results for input(s): TROPONINI in the last 168 hours. ------------------------------------------------------------------------------------------------------------------  RADIOLOGY:  Ct Head Wo Contrast  Result Date: 11/05/2017 CLINICAL DATA:  Difficulty swallowing after eating a disc this morning with slow speech. EXAM: CT HEAD WITHOUT CONTRAST TECHNIQUE: Contiguous axial images were obtained from the base of the skull through the vertex without intravenous contrast. COMPARISON:  CT and  MRI of the head 06/23/2016 FINDINGS: Brain: Chronic encephalomalacia in the right MCA territory. Chronic small  vessel ischemic disease of periventricular white matter. No hydrocephalus, new infarct, hemorrhage or midline shift. No intra-axial mass nor extra-axial fluid collections. Midline fourth ventricle and basal cisterns without effacement. Vascular: Atherosclerosis of the cavernous internal carotids. No hyperdense vessel sign. Skull: Intact Sinuses/Orbits: Nonacute Other: None IMPRESSION: No acute intracranial abnormality. Chronic right MCA territory infarction with encephalomalacia. Chronic small vessel ischemic disease. Electronically Signed   By: Ashley Royalty M.D.   On: 11/05/2017 19:43   Mr Brain Wo Contrast  Result Date: 11/05/2017 CLINICAL DATA:  67 y/o  F; difficulty swallowing since this morning. EXAM: MRI HEAD WITHOUT CONTRAST TECHNIQUE: Multiplanar, multiecho pulse sequences of the brain and surrounding structures were obtained without intravenous contrast. COMPARISON:  11/05/2017 CT head.  06/23/2016 MRI head. FINDINGS: Brain: Numerous small scattered foci of reduced diffusion compatible with acute/early subacute infarction, throughout the left MCA distribution greatest in the left parietal lobe. No associated hemorrhage or mass effect. Stable large chronic right MCA distribution infarction with wallerian degeneration into the cerebral peduncle in right hemi brainstem. Stable small chronic cortical infarctions in the left MCA distribution as seen on prior MRI. Stable background of chronic microvascular ischemic changes and volume loss of the brain. No extra-axial collection, hydrocephalus, or herniation. Hemosiderin staining of the right MCA distribution chronic infarction. Vascular: Normal flow voids. Skull and upper cervical spine: Normal marrow signal. Sinuses/Orbits: Negative. Other: None. IMPRESSION: 1. Numerous scattered small foci of acute/early subacute infarction throughout the left MCA  distribution in greatest concentration in the left parietal lobe. No associated hemorrhage or mass effect. 2. Stable large chronic right MCA and scattered small chronic left MCA distribution infarctions. Stable background of chronic microvascular ischemic changes and volume loss of the brain. These results were called by telephone at the time of interpretation on 11/05/2017 at 10:00 pm to Dr. Larae Grooms , who verbally acknowledged these results. Electronically Signed   By: Kristine Garbe M.D.   On: 11/05/2017 22:03   US Carotid Bilateral (at [facility] And [facility] Only)  Result Date: 11/06/2017 CLINICAL DATA:  Slurred speech. Previous right carotid endarterectomy. EXAM: BILATERAL CAROTID DUPLEX ULTRASOUND TECHNIQUE: Pearline Cables scale imaging, color Doppler and duplex ultrasound were performed of bilateral carotid and vertebral arteries in the neck. COMPARISON:  CTA neck 06/23/2016 FINDINGS: Criteria: Quantification of carotid stenosis is based on velocity parameters that correlate the residual internal carotid diameter with NASCET-based stenosis levels, using the diameter of the distal internal carotid lumen as the denominator for stenosis measurement. The following velocity measurements were obtained: RIGHT ICA: 31/12 cm/sec CCA: 95/62 cm/sec SYSTOLIC ICA/CCA RATIO:  0.7 ECA: 134 cm/sec LEFT ICA: 138/50 cm/sec CCA: 13/08 cm/sec SYSTOLIC ICA/CCA RATIO:  1.9 ECA: 128 cm/sec RIGHT CAROTID ARTERY: Mildly tortuous. Moderate hypoechoic plaque or thrombus in the bulb extending into proximal ICA without high-grade stenosis. Limited color Doppler signal in the right external carotid artery, with normal waveform demonstrated. RIGHT VERTEBRAL ARTERY:  Normal flow direction and waveform. LEFT CAROTID ARTERY: Eccentric plaque in the bulb and proximal ICA resulting in at least mild stenosis. Mildly elevated peak systolic velocities at the stenosis. Otherwise normal waveforms and color Doppler signal. LEFT VERTEBRAL ARTERY:  Normal  flow direction and waveform. IMPRESSION: 1. Moderate residual/recurrent eccentric plaque or thrombus in the right carotid bulb post endarterectomy, without high-grade stenosis. 2. Proximal left ICA plaque resulting in 50-69% diameter stenosis. 3.  Antegrade bilateral vertebral arterial flow. Electronically Signed   By: Lucrezia Europe M.D.   On: 11/06/2017 09:23   Mr Jodene Nam Head/brain  Wo Cm  Result Date: 11/06/2017 CLINICAL DATA:  Slurred speech EXAM: MRA HEAD WITHOUT CONTRAST TECHNIQUE: Angiographic images of the Circle of Willis were obtained using MRA technique without intravenous contrast. COMPARISON:  Brain MRI 11/05/2017 Head CTA 06/23/2016 FINDINGS: ANTERIOR CIRCULATION: --Intracranial internal carotid arteries: Normal. --Anterior cerebral arteries: There is severe narrowing of the proximal A1 segments bilaterally. The A2 segments and distal branches of the anterior cerebral arteries are normal. The degree of proximal stenosis appears increased compared to the CTA from 06/23/2016, though MRA may overestimate stenosis relative to CTA. --Middle cerebral arteries: Chronic high-grade stenosis/multifocal occlusion of the right middle cerebral artery is unchanged. There is severe stenosis of the mid M1 segment of the left middle cerebral artery, also unchanged. Distal left MCA branches are patent. --Posterior communicating arteries: Patent on the right. Not seen on the left. POSTERIOR CIRCULATION: --Basilar artery: Normal. --Posterior cerebral arteries: Normal. --Superior cerebellar arteries: Normal. --Inferior cerebellar arteries: Normal anterior and posterior inferior cerebellar arteries. IMPRESSION: 1. Severe chronic multifocal stenosis of the right middle cerebral artery, unchanged from 06/23/2016, with chronic right MCA territory infarct. 2. Severe stenosis the mid M1 segment of the left middle cerebral artery is unchanged from 06/23/2016, allowing for differences in technique. 3. Severe stenosis both anterior  cerebral artery A1 segments proximally. This appears to have worsened since 06/23/2016, though differences in modality limit the accuracy of the comparison. Electronically Signed   By: Ulyses Jarred M.D.   On: 11/06/2017 04:46    EKG:   Orders placed or performed during the hospital encounter of 11/05/17  . ED EKG  . ED EKG  . EKG 12-Lead  . EKG 12-Lead    ASSESSMENT AND PLAN:   67 year old female with past medical history significant for hypertension, diabetes and prior history of stroke presents to hospital secondary to slurred speech.  1.  Acute stroke-MRI of the brain consistent with acute infarction in left MCA distribution, especially left parietal row. -Patient has chronic right MCA and left MCA distribution infarcts -MRI of the brain consistent with significant stenosis of both right MCA and left MCA resulting in multiple strokes. -Severe stenosis of both anterior cerebral arteries noted as well. -Neurology has been consulted. -On aspirin at home, Plavix has been added.  Continue statin. -Physical therapy and Occupational Therapy consults have been requested.  High risk for recurrent strokes.  2.  Beatties mellitus-check A1c.  Started on sliding scale insulin.  Metformin is currently on hold.  3.  Hypertension-meds on hold, permissive blood pressure elevation recommended.  4.  UTI-cultures are pending, on Levaquin  5.  DVT prophylaxis-Lovenox    All the records are reviewed and case discussed with Care Management/Social Workerr. Management plans discussed with the patient, family and they are in agreement.  CODE STATUS: Full code  TOTAL TIME TAKING CARE OF THIS PATIENT: 37 minutes.   POSSIBLE D/C IN 1-2 DAYS, DEPENDING ON CLINICAL CONDITION.   Gladstone Lighter M.D on 11/06/2017 at 12:58 PM  Between 7am to 6pm - Pager - [phone]  After 6pm go to [email or web address] - password [email or web address] [facility]  Office  [phone]  CC: Primary care  physician; Kirk Ruths, MD

## 2017-11-06 NOTE — Evaluation (Signed)
Speech Language Pathology Evaluation Patient Details Name: Erin Hamilton MRN: [id number] DOB: 05-13-1950 Today's Date: 11/06/2017 Time: 3536-1443 SLP Time Calculation (min) (ACUTE ONLY): 60 min  Problem List:  Patient Active Problem List   Diagnosis Date Noted  . UTI (urinary tract infection) 11/06/2017  . Hemiparesis affecting left side as late effect of cerebrovascular accident ([place]) 06/24/2016  . Bilateral carotid artery stenosis   . H/O carotid endarterectomy   . Cognitive dysfunction due to acute cerebrovascular accident (CVA) ([place]) 06/23/2016  . Diabetes mellitus without complication ([place]) 15/40/0867  . Essential hypertension, benign 10/10/2014  . Back pain with left-sided sciatica 10/10/2014  . Acute anxiety 10/09/2014  . Stroke ([place]) 10/09/2014  . Hyperlipidemia 10/09/2014  . Depression 10/09/2014  . Hypertensive CKD (chronic kidney disease) 10/09/2014  . Type 2 diabetes mellitus with hyperglycemia, without long-term current use of insulin ([place]) 09/22/2009  . ACUTE SINUSITIS, UNSPECIFIED 09/22/2009   Past Medical History:  Past Medical History:  Diagnosis Date  . Anxiety   . CVA (cerebral infarction)   . Diabetes mellitus without complication ([place])   . Hyperlipidemia   . Hypertension   . Stroke [facility])    Past Surgical History:  Past Surgical History:  Procedure Laterality Date  . TUBAL LIGATION     HPI:  Pt is a 67 y.o. female who presents with h/o CVA w/ LUE weakness, HTN, DM, anxiety/depression, Cognitive dysfunction due to CVA, and other medical issues.  Patient developed slurring of speech this morning while eating.  This persisted throughout the day and she came to the ED for evaluation.  She has a history of prior stroke with residual left-sided symptoms.  MRI imaging in the ED shows new multiple areas of ischemic infarct.  Pt indicated she had residual speech deficits post last CVA and received speech therapy services but was "released from therapy" (2018).   There was a noted in the chart from [place] at a [facility] agency stating "Amy(ST) with [facility] home [facility] called and stated that the pt has declined speech therapy" - 05/2016.     Assessment / Plan / Recommendation Clinical Impression  Pt appears to present w/ Cognitive-linguistic deficits as well as Mild+ Motor Speech deficits c/b slower rate of speech, dsyarthria. Pt stated she had residual deficits post the "last stroke" and this was documented in pt's chart as "Cognitive dysfunction due to acute cerebrovascular accident" in 05/2016. by MD notes. Pt stated she had been "released" by Speech Therapy at that time but a note in pt's chart indicated she had declined the [place] services scheduled. Pt described her speech as slower and she has to "take her time to be clear". She was vague on when this originated but agreed that her speech currently was close to her baseline. Discussed w/ her the Dysarthria/Motor Speech strategies (as she was doing) to aid improving her intelligibility: slow rate of speech, over-articulation, increasing volume, pausing when needed. Suspect general Cognitive-linguistic deficits impacting her receptive and expressive language abilities; pt was able to answer general questions re: self and order her lunch meal from the menu. She followed through w/ basic commands w/ cues. Noted some impulsive behavior as she attempted to get up/out of bed BEFORE pushing call bell for NSG. Pt also indicated she hoped she did not have to stay "another night here" in the hospital. Upon asking pt if she would like to have f/u ST services to address any Cognitive-linguistic or speech issues, pt stated she did not feel it was  needed - she agreed to discuss it w/ her PCP when she follows up w/ him post discharge(Dr. Ouida Sills). Strategies given. Recommend Supervision initially post discharge IF returning home Alone. ST services will be available for further education as needed while admitted.     SLP Assessment   SLP Recommendation/Assessment: All further Speech Lanaguage Pathology  needs can be addressed in the next venue of care(if pt chooses) SLP Visit Diagnosis: Cognitive communication deficit (R41.841)    Follow Up Recommendations  Home health SLP(TBD)    Frequency and Duration (n/a)  (n/a)      SLP Evaluation Cognition  Overall Cognitive Status: History of cognitive impairments - at baseline Arousal/Alertness: Awake/alert Orientation Level: Oriented X4("aren't you going to ask me the questions") Attention: Focused Focused Attention: Appears intact Memory: Appears intact Awareness: Appears intact Problem Solving: Appears intact(but impulsive somewhat) Executive Function: Reasoning Reasoning: Impaired Reasoning Impairment: (somewhat impulsive) Behaviors: Impulsive(min) Safety/Judgment: Impaired Comments: unsure if full awareness of consequences w/ impulsive behaviors       Comprehension  Auditory Comprehension Overall Auditory Comprehension: Impaired at baseline Yes/No Questions: Within Functional Limits Commands: Within Functional Limits Conversation: Simple Other Conversation Comments: residual deficits post prior CVAs Interfering Components: Processing speed EffectiveTechniques: Extra processing time;Stressing words;Pausing Visual Recognition/Discrimination Discrimination: Not tested Reading Comprehension Reading Status: Within funtional limits(w/ menu tasks)    Expression Expression Primary Mode of Expression: Verbal Verbal Expression Overall Verbal Expression: Impaired at baseline(per pt report) Initiation: No impairment Automatic Speech: Name;Social Response;Counting;Day of week Level of Generative/Spontaneous Verbalization: Phrase Repetition: No impairment Naming: No impairment Interfering Components: Premorbid deficit Non-Verbal Means of Communication: Not applicable Other Verbal Expression Comments: slower rate of speech during conversation; pt seems to take  her time as she responds Written Expression Dominant Hand: Right Written Expression: Not tested   Oral / Motor  Oral Motor/Sensory Function Overall Oral Motor/Sensory Function: Within functional limits(grossly) Motor Speech Overall Motor Speech: Impaired at baseline(per pt report) Respiration: Within functional limits Phonation: Normal Resonance: Within functional limits Articulation: Impaired Level of Impairment: Phrase Intelligibility: Intelligibility reduced Word: 75-100% accurate Phrase: 75-100% accurate Conversation: 75-100% accurate Motor Planning: Impaired Level of Impairment: Insurance risk surveyor Errors: Aware Interfering Components: Premorbid status Effective Techniques: Slow rate;Over-articulate;Pause   GO          Functional Limitations: (Cognitive functioning per chart notes) - baseline           Orinda Kenner, [place], CCC-SLP Erika Slaby 11/06/2017, 12:43 PM

## 2017-11-06 NOTE — Evaluation (Signed)
Occupational Therapy Evaluation Patient Details Name: Erin Hamilton MRN: [id number] DOB: [birth year] Today's Date: 11/06/2017    History of Present Illness Pt. is a 67 y.o. female who presented to the ER with slurred speech. Imaging revealed an Acute CVA the LEft MCA, Left parietal row, Chronic Right MCA, and Left MCA distribution Infarcts. Pt. PMHx includes: Anxiety, CVA, DM, Hyperlipidemia, and HTN   Clinical Impression   Pt. presents with LUE weakness, and limited functional mobility which hinder her ability to complete ADL, and IADL tasks. Pt. resides at home alone. Pt. was independent with ADLs, and IADLs, medication management, light home management. Pt. was receiving meals on wheels for one meal a day. Pt. reports being able to drive. Pt. has a daughter who lives in [place].  Pt. requires verbal cues for safety, and redirection. Pt. required CGA skills to walk to the toilet, and perform thorough clothing negotiation management. Pt. could benefit from OT services for ADL training, A/E training, UE neuromuscular re-education, and pt. education about home modification, and DME. Pt. plans to return home upon discharge with family to assist pt. as needed. Pt. could benefit from follow-up [place] services upon discharge.    Follow Up Recommendations  Home health OT    Equipment Recommendations       Recommendations for Other Services       Precautions / Restrictions        Mobility Bed Mobility Overal bed mobility: Independent                Transfers Overall transfer level: Needs assistance   Transfers: Sit to/from Stand Sit to Stand: Supervision         General transfer comment: Functional mobility: Min guard    Balance                                           ADL either performed or assessed with clinical judgement   ADL Overall ADL's : Needs assistance/impaired Eating/Feeding: Set up;Independent   Grooming: Set up;Min guard   Upper Body  Bathing: Set up;Min guard   Lower Body Bathing: Set up;Min guard   Upper Body Dressing : Set up;Min guard   Lower Body Dressing: Min guard;Set up   Toilet Transfer: Min guard   [place] and Hygiene: Min guard;Set up;Independent Toileting - Clothing Manipulation Details (indicate cue type and reason): Assist to hike pants thoroughly over the left hip. Toilet hygiene: independent     Functional mobility during ADLs: Min guard       Vision Baseline Vision/History: Wears glasses Wears Glasses: At all times Patient Visual Report: No change from baseline       Perception     Praxis      Pertinent Vitals/Pain Pain Assessment: No/denies pain     Hand Dominance Right   Extremity/Trunk Assessment Upper Extremity Assessment Upper Extremity Assessment: LUE deficits/detail LUE Deficits / Details: LImited shoulder AROM. WFL elbow, and wrist ROM Pt. wears a winter glove on the left hand. LUE Sensation: decreased proprioception LUE Coordination: decreased fine motor;decreased gross motor           Communication     Cognition Arousal/Alertness: Awake/alert   Overall Cognitive Status: History of cognitive impairments - at baseline  General Comments       Exercises     Shoulder Instructions      Home Living Family/patient expects to be discharged to:: Private residence Living Arrangements: Alone   Type of Home: House Home Access: Stairs to enter [employer] of Steps: 4 to 5 in the back entrance   Home Layout: One level     Bathroom Shower/Tub: Tub/shower unit;Curtain   Biochemist, clinical: [place] - single point;Bedside commode;Grab bars - tub/shower          Prior Functioning/Environment Level of Independence: Needs assistance    ADL's / Homemaking Assistance Needed: Independent with ADLs, and IADLs, medication management, received meals on  wheels for one meal a day.             OT Problem List:        OT Treatment/Interventions: Self-care/ADL training;Therapeutic exercise;Patient/family education;DME and/or AE instruction;Cognitive remediation/compensation;Therapeutic activities;Neuromuscular education    OT Goals(Current goals can be found in the care plan section) Acute Rehab OT Goals Patient Stated Goal: To return home OT Goal Formulation: With patient Potential to Achieve Goals: Good  OT Frequency: Min 1X/week   Barriers to D/C:            Co-evaluation              AM-PAC PT "6 Clicks" Daily Activity     Outcome Measure Help from another person eating meals?: None Help from another person taking care of personal grooming?: A Little Help from another person toileting, which includes using toliet, bedpan, or urinal?: A Little Help from another person bathing (including washing, rinsing, drying)?: A Little Help from another person to put on and taking off regular upper body clothing?: A Little Help from another person to put on and taking off regular lower body clothing?: A Little 6 Click Score: 19   End of Session    Activity Tolerance: Patient tolerated treatment well Patient left: in bed;with call bell/phone within reach;with bed alarm set  OT Visit Diagnosis: Muscle weakness (generalized) (M62.81);History of falling (Z91.81)                Time: 5726-2035 OT Time Calculation (min): 26 min Charges:  OT General Charges $OT Visit: 1 Visit OT Evaluation $OT Eval Moderate Complexity: 1 Mod  Harrel Carina, MS, OTR/L  Harrel Carina 11/06/2017, 4:12 PM

## 2017-11-06 NOTE — Progress Notes (Signed)
PT Cancellation Note  Patient Details Name: Erin Hamilton MRN: [id number] DOB: May 17, 1950   Cancelled Treatment:    Reason Eval/Treat Not Completed: Patient declined, no reason specified(eating lunch ) Patient just received lunch. Patient decline PT to eat.   Janna Arch, PT, DPT   11/06/2017, 1:20 PM

## 2017-11-06 NOTE — Progress Notes (Signed)
Pharmacy Antibiotic Note  Erin Hamilton is a 67 y.o. female admitted on 11/05/2017 with UTI.  Pharmacy has been consulted for Levaquin dosing.  Plan: Levaquin 750 mg q 48 hours ordered.  Height: 5\' 2"  (157.5 cm) Weight: 145 lb 15.1 oz (66.2 kg) IBW/kg (Calculated) : 50.1  Temp (24hrs), Avg:98.7 F (37.1 C), Min:98.4 F (36.9 C), Max:98.9 F (37.2 C)  Recent Labs  Lab 11/05/17 1907  WBC 6.4  CREATININE 1.02*    Estimated Creatinine Clearance: 47.7 mL/min (A) (by C-G formula based on SCr of 1.02 mg/dL (H)).    Allergies  Allergen Reactions  . Oysters [Shellfish Allergy] Anaphylaxis  . Penicillins Anaphylaxis    Has patient had a PCN reaction causing immediate rash, facial/tongue/throat swelling, SOB or lightheadedness with hypotension: Yes Has patient had a PCN reaction causing severe rash involving mucus membranes or skin necrosis: No Has patient had a PCN reaction that required hospitalization No Has patient had a PCN reaction occurring within the last 10 years: No If all of the above answers are "NO", then may proceed with Cephalosporin use.   . Latex Rash    Antimicrobials this admission: Levaquin 9/7  >>    >>   Dose adjustments this admission:   Microbiology results: 9/6 UCx: pending      9/6 UA: LE(+) NO2(+) WBC 21-50 Thank you for allowing pharmacy to be a part of this patient's care.  Analese Sovine S 11/06/2017 1:03 AM

## 2017-11-07 LAB — CBC
HCT: 38.7 % (ref 35.0–47.0)
HEMOGLOBIN: 13.7 g/dL (ref 12.0–16.0)
MCH: 31.9 pg (ref 26.0–34.0)
MCHC: 35.3 g/dL (ref 32.0–36.0)
MCV: 90.5 fL (ref 80.0–100.0)
Platelets: 227 10*3/uL (ref 150–440)
RBC: 4.28 MIL/uL (ref 3.80–5.20)
RDW: 14 % (ref 11.5–14.5)
WBC: 8.6 10*3/uL (ref 3.6–11.0)

## 2017-11-07 LAB — BASIC METABOLIC PANEL
Anion gap: 9 (ref 5–15)
BUN: 17 mg/dL (ref 8–23)
CALCIUM: 9.1 mg/dL (ref 8.9–10.3)
CHLORIDE: 107 mmol/L (ref 98–111)
CO2: 26 mmol/L (ref 22–32)
CREATININE: 0.92 mg/dL (ref 0.44–1.00)
GFR calc non Af Amer: >60 mL/min (ref >60)
Glucose, Bld: 182 mg/dL — ABNORMAL HIGH (ref 70–99)
Potassium: 3.7 mmol/L (ref 3.5–5.1)
SODIUM: 142 mmol/L (ref 135–145)

## 2017-11-07 LAB — ECHOCARDIOGRAM COMPLETE
HEIGHTINCHES: 62 in
WEIGHTICAEL: 2335.11 [oz_av]

## 2017-11-07 LAB — GLUCOSE, CAPILLARY: GLUCOSE-CAPILLARY: 169 mg/dL — AB (ref 70–99)

## 2017-11-07 NOTE — Evaluation (Signed)
Physical Therapy Evaluation Patient Details Name: Erin Hamilton MRN: [id number] DOB: 1950/04/27 Today's Date: 11/07/2017   History of Present Illness  Pt. is a 67 y.o. female who presented to the ER with slurred speech. Imaging revealed an Acute CVA the LEft MCA, Left parietal row, Chronic Right MCA, and Left MCA distribution Infarcts. Pt. PMHx includes: Anxiety, CVA, DM, Hyperlipidemia, and HTN  Clinical Impression  Pt is up to walk with supervised assistance, noted her use of IV pole was not necessary but she is more carefully ambulating without it.  Her hope is to go home and has refused therapy, but asked MD to write outpatient order as PT has spoken to pt about the benefit of using them.  Pt would likely need assistance with ROM on L UE and would also be better with high level coordination exercises, as well as endurance training.  Follow acutely for same goals.    Follow Up Recommendations Outpatient PT    Equipment Recommendations  None recommended by PT(has SPC and walker if needed)    Recommendations for Other Services       Precautions / Restrictions Precautions Precautions: Fall(telemetry just discontinued) Precaution Comments: safety awareness is minimally impaired Restrictions Weight Bearing Restrictions: No Other Position/Activity Restrictions: Has flexion contracture on LUE with limited ext of elbow      Mobility  Bed Mobility Overal bed mobility: Modified Independent             General bed mobility comments: slow to move due to her LUE contracture and tone  Transfers Overall transfer level: Needs assistance Equipment used: 1 person hand held assist Transfers: Sit to/from Stand Sit to Stand: Supervision            Ambulation/Gait Ambulation/Gait assistance: Min guard Gait Distance (Feet): 350 Feet Assistive device: 1 person hand held assist;IV Pole Gait Pattern/deviations: Step-to pattern;Step-through pattern;Wide base of support;Decreased weight  shift to left Gait velocity: reduced Gait velocity interpretation: <1.31 ft/sec, indicative of household ambulator General Gait Details: pt prefers RLE but demonstrates strength on LLE of WFL and very mild coordination changes, able to walk with supervision  Stairs            Wheelchair Mobility    Modified Rankin (Stroke Patients Only) Modified Rankin (Stroke Patients Only) Pre-Morbid Rankin Score: Slight disability Modified Rankin: Moderate disability     Balance Overall balance assessment: Needs assistance Sitting-balance support: Feet supported Sitting balance-[name] Scale: Good     Standing balance support: No upper extremity supported Standing balance-[name] Scale: Fair                               Pertinent Vitals/Pain Pain Assessment: No/denies pain    Home Living Family/patient expects to be discharged to:: Private residence Living Arrangements: Alone Available Help at Discharge: Family;Available PRN/intermittently Type of Home: House Home Access: Stairs to enter Entrance Stairs-Rails: Right;Left;Can reach both Entrance Stairs-Number of Steps: 4 in back, 1 in front with rails Home Layout: One level Home Equipment: Cane - single point;Bedside commode;Grab bars - tub/shower      Prior Function Level of Independence: Independent(using meals on wheels for one meal a day)      ADL's / Homemaking Assistance Needed: Independent with ADLs, and IADLs, medication management, received meals on wheels for one meal a day.         Hand Dominance   Dominant Hand: Right    Extremity/Trunk Assessment   Upper  Extremity Assessment Upper Extremity Assessment: Defer to OT evaluation    Lower Extremity Assessment Lower Extremity Assessment: Overall WFL for tasks assessed    Cervical / Trunk Assessment Cervical / Trunk Assessment: Normal  Communication   Communication: Expressive difficulties(understandable but words are slurred)  Cognition  Arousal/Alertness: Awake/alert Behavior During Therapy: WFL for tasks assessed/performed Overall Cognitive Status: No family/caregiver present to determine baseline cognitive functioning                                 General Comments: has some issues with safety awareness      General Comments General comments (skin integrity, edema, etc.): significant contracture on LUE with weakness to open hand and stiffness to reach overhead.      Exercises     Assessment/Plan    PT Assessment Patient needs continued PT services  PT Problem List Decreased range of motion;Decreased balance;Decreased activity tolerance;Decreased coordination;Decreased safety awareness       PT Treatment Interventions DME instruction;Gait training;Stair training;Functional mobility training;Therapeutic activities;Therapeutic exercise;Balance training;Neuromuscular re-education;Patient/family education    PT Goals (Current goals can be found in the Care Plan section)  Acute Rehab PT Goals Patient Stated Goal: To return home PT Goal Formulation: With patient Time For Goal Achievement: 11/14/17 Potential to Achieve Goals: Good    Frequency 7X/week   Barriers to discharge Decreased caregiver support home alone with sporadic help    Co-evaluation               AM-PAC PT "6 Clicks" Daily Activity  Outcome Measure Difficulty turning over in bed (including adjusting bedclothes, sheets and blankets)?: A Little Difficulty moving from lying on back to sitting on the side of the bed? : A Little Difficulty sitting down on and standing up from a chair with arms (e.g., wheelchair, bedside commode, etc,.)?: A Little Help needed moving to and from a bed to chair (including a wheelchair)?: A Little Help needed walking in hospital room?: A Little Help needed climbing 3-5 steps with a railing? : A Little 6 Click Score: 18    End of Session Equipment Utilized During Treatment: Gait belt Activity  Tolerance: Patient tolerated treatment well Patient left: in bed;with call bell/phone within reach Nurse Communication: Mobility status PT Visit Diagnosis: Other abnormalities of gait and mobility (R26.89);Hemiplegia and hemiparesis Hemiplegia - Right/Left: Left Hemiplegia - dominant/non-dominant: Non-dominant Hemiplegia - caused by: Cerebral infarction    Time: 9024-0973 PT Time Calculation (min) (ACUTE ONLY): 30 min   Charges:   PT Evaluation $PT Eval Moderate Complexity: 1 Mod PT Treatments $Gait Training: 8-22 mins        Ramond Dial 11/07/2017, 1:05 PM   Mee Hives, PT MS Acute Rehab Dept. Number: [place] and [place]

## 2017-11-07 NOTE — Discharge Summary (Signed)
[facility] NAME: Erin Hamilton    MR#:  [id number]  DATE OF BIRTH:  1950-11-25  DATE OF ADMISSION:  11/05/2017   ADMITTING PHYSICIAN: Lance Coon, MD  DATE OF DISCHARGE: 11/07/17  PRIMARY CARE PHYSICIAN: Kirk Ruths, MD   ADMISSION DIAGNOSIS:   Urinary tract infection without hematuria, site unspecified [N39.0] Cerebrovascular accident (CVA), unspecified mechanism ([place]) [I63.9]  DISCHARGE DIAGNOSIS:   Principal Problem:   Stroke [facility]) Active Problems:   Hyperlipidemia   Diabetes mellitus without complication ([place])   Essential hypertension, benign   UTI (urinary tract infection)   SECONDARY DIAGNOSIS:   Past Medical History:  Diagnosis Date  . Anxiety   . CVA (cerebral infarction)   . Diabetes mellitus without complication ([place])   . Hyperlipidemia   . Hypertension   . Stroke [facility])     HOSPITAL COURSE:   67 year old female with past medical history significant for hypertension, diabetes and prior history of stroke presents to hospital secondary to slurred speech.  1.  Acute stroke-MRI of the brain consistent with acute infarction in left MCA distribution, especially left parietal row. -Patient has chronic right MCA and left MCA distribution infarcts -MRI of the brain consistent with significant stenosis of both right MCA and left MCA resulting in multiple strokes. -Severe stenosis of both anterior cerebral arteries noted as well. -Neurology has been consulted. -On aspirin at home, Plavix has been added.  Continue statin. -Physical therapy and Occupational Therapy consults - High risk for recurrent strokes. -Appreciate neurology input.  Outpatient vascular follow-up recommended.  Also will need a 30-day monitor-follow-up with cardiology. -Dietary and lifestyle modifications recommended.  2.  Diabetes mellitus-pending A1c.  On metformin at home  3.  Hypertension-continue low-dose lisinopril  4.   UTI-cultures are pending, received Levaquin while in the hospital.  Not symptomatic.  Ambulated well, will be discharged with outpatient physical therapy recommendations   DISCHARGE CONDITIONS:   Guarded CONSULTS OBTAINED:   Neurology consult  DRUG ALLERGIES:   Allergies  Allergen Reactions  . Oysters [Shellfish Allergy] Anaphylaxis  . Penicillins Anaphylaxis    Has patient had a PCN reaction causing immediate rash, facial/tongue/throat swelling, SOB or lightheadedness with hypotension: Yes Has patient had a PCN reaction causing severe rash involving mucus membranes or skin necrosis: No Has patient had a PCN reaction that required hospitalization No Has patient had a PCN reaction occurring within the last 10 years: No If all of the above answers are "NO", then may proceed with Cephalosporin use.   . Latex Rash   DISCHARGE MEDICATIONS:   Allergies as of 11/07/2017      Reactions   Oysters [shellfish Allergy] Anaphylaxis   Penicillins Anaphylaxis   Has patient had a PCN reaction causing immediate rash, facial/tongue/throat swelling, SOB or lightheadedness with hypotension: Yes Has patient had a PCN reaction causing severe rash involving mucus membranes or skin necrosis: No Has patient had a PCN reaction that required hospitalization No Has patient had a PCN reaction occurring within the last 10 years: No If all of the above answers are "NO", then may proceed with Cephalosporin use.   Latex Rash      Medication List    TAKE these medications   aspirin EC 81 MG tablet Take 81 mg by mouth daily.   atorvastatin 80 MG tablet Commonly known as:  LIPITOR Take 80 mg by mouth daily.   clopidogrel 75 MG tablet Commonly known as:  PLAVIX TAKE 1  TABLET EVERY DAY What changed:    how much to take  how to take this  when to take this   ezetimibe 10 MG tablet Commonly known as:  ZETIA Take 1 tablet (10 mg total) by mouth daily.   gabapentin 400 MG capsule Commonly known  as:  NEURONTIN TAKE 1 CAPSULE (400 MG TOTAL) BY MOUTH 3 (THREE) TIMES DAILY. What changed:  See the new instructions.   glucose blood test strip 1 each by Other route daily. Use as instructed   lisinopril 10 MG tablet Commonly known as:  PRINIVIL,ZESTRIL TAKE 1 TABLET EVERY DAY   metFORMIN 500 MG tablet Commonly known as:  GLUCOPHAGE TAKE 1 TABLET TWICE DAILY BEFORE A MEAL        DISCHARGE INSTRUCTIONS:   1.  PCP follow-up in 1 to 2 weeks 2.  Neurology follow-up in 2 weeks 3.  Cardiology month follow-up for 30-day monitor 4.  Vascular follow-up in 2 weeks  DIET:   Cardiac diet  ACTIVITY:   Activity as tolerated  OXYGEN:   Home Oxygen: No.  Oxygen Delivery: room air  DISCHARGE LOCATION:   home   If you experience worsening of your admission symptoms, develop shortness of breath, life threatening emergency, suicidal or homicidal thoughts you must seek medical attention immediately by calling 911 or calling your MD immediately  if symptoms less severe.  You Must read complete instructions/literature along with all the possible adverse reactions/side effects for all the Medicines you take and that have been prescribed to you. Take any new Medicines after you have completely understood and accpet all the possible adverse reactions/side effects.   Please note  You were cared for by a hospitalist during your hospital stay. If you have any questions about your discharge medications or the care you received while you were in the hospital after you are discharged, you can call the unit and asked to speak with the hospitalist on call if the hospitalist that took care of you is not available. Once you are discharged, your primary care physician will handle any further medical issues. Please note that NO REFILLS for any discharge medications will be authorized once you are discharged, as it is imperative that you return to your primary care physician (or establish a relationship  with a primary care physician if you do not have one) for your aftercare needs so that they can reassess your need for medications and monitor your lab values.    On the day of Discharge:  VITAL SIGNS:   Blood pressure (!) 148/68, pulse 87, temperature 98.1 F (36.7 C), temperature source Oral, resp. rate 18, height 5\' 2"  (1.575 m), weight 66.2 kg, SpO2 98 %.  PHYSICAL EXAMINATION:   GENERAL:  67 y.o.-year-old patient lying in the bed with no acute distress.  EYES: Pupils equal, round, reactive to light and accommodation. No scleral icterus. Extraocular muscles intact.  HEENT: Head atraumatic, normocephalic. Oropharynx and nasopharynx clear.  NECK:  Supple, no jugular venous distention. No thyroid enlargement, no tenderness.  LUNGS: Normal breath sounds bilaterally, no wheezing, rales,rhonchi or crepitation. No use of accessory muscles of respiration.  CARDIOVASCULAR: S1, S2 normal. No murmurs, rubs, or gallops.  ABDOMEN: Soft, nontender, nondistended. Bowel sounds present. No organomegaly or mass.  EXTREMITIES: No pedal edema, cyanosis, or clubbing.  NEUROLOGIC: Cranial nerves II through XII are intact.  Slurred speech noted.  Muscle strength 5/5 in all extremities except slightly drift noted on left upper and lower extremities.. Sensation intact. Gait not  checked.  PSYCHIATRIC: The patient is alert and oriented x 3.  SKIN: No obvious rash, lesion, or ulcer.   DATA REVIEW:   CBC Recent Labs  Lab 11/07/17 0450  WBC 8.6  HGB 13.7  HCT 38.7  PLT 227    Chemistries  Recent Labs  Lab 11/07/17 0450  NA 142  K 3.7  CL 107  CO2 26  GLUCOSE 182*  BUN 17  CREATININE 0.92  CALCIUM 9.1     Microbiology Results  Results for orders placed or performed during the hospital encounter of 11/05/17  Urine Culture     Status: Abnormal (Preliminary result)   Collection Time: 11/05/17  9:26 PM  Result Value Ref Range Status   Specimen Description   Final    URINE, RANDOM Performed  at [facility], [address], [place], [place] [address]    Special Requests   Final    NONE Performed at [facility], [address], [place], [place] [address]    Culture (A)  Final    >=100,000 COLONIES/mL GRAM NEGATIVE RODS IDENTIFICATION AND SUSCEPTIBILITIES TO FOLLOW Performed at [facility], [place] [address] [place], [place] [address]    Report Status PENDING  Incomplete    RADIOLOGY:  No results found.   Management plans discussed with the patient, family and they are in agreement.  CODE STATUS:     Code Status Orders  (From admission, onward)         Start     Ordered   11/06/17 0042  Full code  Continuous     11/06/17 0041        Code Status History    Date Active Date Inactive Code Status Order ID Comments User Context   06/23/2016 2007 06/25/2016 2054 Full Code [id number]  Burgess Estelle, MD Inpatient      TOTAL TIME TAKING CARE OF THIS PATIENT: 38 minutes.    Gladstone Lighter M.D on 11/07/2017 at 1:27 PM  Between 7am to 6pm - Pager - [phone]  After 6pm go to [email or web address] - [employer] [facility]  Office  [phone]  CC: Primary care physician; Kirk Ruths, MD   Note: This dictation was prepared with Dragon dictation along with smaller phrase technology. Any transcriptional errors that result from this process are unintentional.

## 2017-11-07 NOTE — Plan of Care (Signed)
Pt report she could not sleep. New order and sleep med given. No current neuro deficits

## 2017-11-08 LAB — URINE CULTURE: Culture: >=100000 — AB

## 2017-11-08 LAB — HIV ANTIBODY (ROUTINE TESTING W REFLEX): HIV SCREEN 4TH GENERATION: NONREACTIVE

## 2017-11-16 DIAGNOSIS — G8194 Hemiplegia, unspecified affecting left nondominant side: Secondary | ICD-10-CM | POA: Diagnosis not present

## 2017-11-16 DIAGNOSIS — E119 Type 2 diabetes mellitus without complications: Secondary | ICD-10-CM | POA: Diagnosis not present

## 2017-11-16 DIAGNOSIS — R300 Dysuria: Secondary | ICD-10-CM | POA: Diagnosis not present

## 2017-11-16 DIAGNOSIS — E782 Mixed hyperlipidemia: Secondary | ICD-10-CM | POA: Diagnosis not present

## 2017-11-16 DIAGNOSIS — I779 Disorder of arteries and arterioles, unspecified: Secondary | ICD-10-CM | POA: Diagnosis not present

## 2017-11-16 DIAGNOSIS — I10 Essential (primary) hypertension: Secondary | ICD-10-CM | POA: Diagnosis not present

## 2017-11-17 ENCOUNTER — Encounter (INDEPENDENT_AMBULATORY_CARE_PROVIDER_SITE_OTHER): Payer: Self-pay | Admitting: Nurse Practitioner

## 2017-11-17 ENCOUNTER — Ambulatory Visit (INDEPENDENT_AMBULATORY_CARE_PROVIDER_SITE_OTHER): Payer: Medicare HMO | Admitting: Nurse Practitioner

## 2017-11-17 VITALS — BP 126/78 | HR 84 | Resp 16 | Ht 62.0 in | Wt 135.8 lb

## 2017-11-17 DIAGNOSIS — E1165 Type 2 diabetes mellitus with hyperglycemia: Secondary | ICD-10-CM | POA: Diagnosis not present

## 2017-11-17 DIAGNOSIS — I6523 Occlusion and stenosis of bilateral carotid arteries: Secondary | ICD-10-CM

## 2017-11-17 DIAGNOSIS — E785 Hyperlipidemia, unspecified: Secondary | ICD-10-CM | POA: Diagnosis not present

## 2017-11-17 DIAGNOSIS — I10 Essential (primary) hypertension: Secondary | ICD-10-CM

## 2017-11-17 NOTE — Progress Notes (Signed)
Subjective:    Patient ID: Erin Hamilton, female    DOB: August 20, 1950, 66 y.o.   MRN: [id number] No chief complaint on file.  Chief Complaint  Patient presents with  . Follow-up    [facility] 1week follow up     HPI  Erin Hamilton is a 67 y.o. female seen for evaluation of carotid stenosis. The carotid stenosis was identified after CVA on 11/07/2017.  She also has a history of repeated CVAs.  A CVA approximately 5 years ago left her with left-sided weakness.  The patient states that she has had no residual from previous stroke on 11/17/2017.  Patient reports that she had a right carotid endarterectomy approximately 5 years ago.  The patient denies amaurosis fugax.   There is no history of migraine headaches. There is no history of seizures.  The patient is taking enteric-coated aspirin 81 mg daily.  The patient has a history of coronary artery disease, no recent episodes of angina or shortness of breath. The patient denies PAD or claudication symptoms. There is a history of hyperlipidemia which is being treated with a statin.   The patient underwent a bilateral carotid duplex while in the hospital.  On 11/06/2017 the left internal carotid artery showed 50 to 69% stenosis.  The right internal carotid artery shows moderate plaque without high-grade stenosis, with evidence of carotid endarterectomy.  Review of Systems: Negative Unless Checked Constitutional: [] Weight loss  [] Fever  [] Chills Cardiac: [] Chest pain   [] Chest pressure   [] Palpitations   [] Shortness of breath when laying flat   [] Shortness of breath with exertion. Vascular:  [] Pain in legs with walking   [] Pain in legs with standing  [] History of DVT   [] Phlebitis   [] Swelling in legs   [] Varicose veins   [] Non-healing ulcers Pulmonary:   [] Uses home oxygen   [] Productive cough   [] Hemoptysis   [] Wheeze  [] COPD   [] Asthma Neurologic:  [] Dizziness   [] Seizures   [x] History of stroke   [] History of TIA  [] Aphasia   [] Vissual changes    [x] Weakness or numbness in arm   [] Weakness or numbness in leg Musculoskeletal:   [] Joint swelling   [] Joint pain   [] Low back pain Hematologic:  [] Easy bruising  [] Easy bleeding   [] Hypercoagulable state   [] Anemic Gastrointestinal:  [] Diarrhea   [] Vomiting  [] Gastroesophageal reflux/heartburn   [] Difficulty swallowing. Genitourinary:  [] Chronic kidney disease   [] Difficult urination  [] Frequent urination   [] Blood in urine Skin:  [] Rashes   [] Ulcers  Psychological:  [] History of anxiety   []  History of major depression.     Objective:   Physical Exam  BP 126/78 (BP Location: Right Arm)   Pulse 84   Resp 16   Ht 5\' 2"  (1.575 m)   Wt 135 lb 12.8 oz (61.6 kg)   LMP  (LMP Unknown)   BMI 24.84 kg/m    LMP  (LMP Unknown)   Past Medical History:  Diagnosis Date  . Anxiety   . CVA (cerebral infarction)   . Diabetes mellitus without complication ([place])   . Hyperlipidemia   . Hypertension   . Stroke [facility])      Gen: WD/WN, NAD, peers older than stated age. Head: [place]/AT, No temporalis wasting.  Ear/Nose/Throat: Hearing grossly intact, nares w/o erythema or drainage.  Poor dentition Eyes: PER, EOMI, sclera nonicteric.  Neck: Supple, no masses.  No JVD.  Scar from right carotid endarterectomy Pulmonary:  Good air movement, no use of accessory muscles.  Cardiac: RRR Vascular:  Vessel Right Left  Radial    Brachial    Femoral    Popliteal    PT    DP    Carotid    Gastrointestinal: soft, non-distended. No guarding/no peritoneal signs.  Musculoskeletal: M/S 5/5 throughout.  No deformity or atrophy.  Neurologic: Pain and light touch intact in extremities.  Symmetrical.  Speech is fluent. Motor exam as listed above. Psychiatric: Judgment intact, Mood & affect appropriate for pt's clinical situation. Dermatologic: No Venous rashes. No Ulcers Noted.  No changes consistent with cellulitis. Lymph : No Cervical lymphadenopathy, no lichenification or skin changes of chronic  lymphedema.   Social History   Socioeconomic History  . Marital status: Married    Spouse name: Not on file  . Number of children: Not on file  . Years of education: Not on file  . Highest education level: Not on file  Occupational History  . Not on file  Social Needs  . Financial resource strain: Not on file  . Food insecurity:    Worry: Not on file    Inability: Not on file  . Transportation needs:    Medical: Not on file    Non-medical: Not on file  Tobacco Use  . Smoking status: Former Research scientist (life sciences)  . Smokeless tobacco: Never Used  Substance and Sexual Activity  . Alcohol use: No    Alcohol/week: 0.0 standard drinks  . Drug use: No  . Sexual activity: Never  Lifestyle  . Physical activity:    Days per week: Not on file    Minutes per session: Not on file  . Stress: Not on file  Relationships  . Social connections:    Talks on phone: Not on file    Gets together: Not on file    Attends religious service: Not on file    Active member of club or organization: Not on file    Attends meetings of clubs or organizations: Not on file    Relationship status: Not on file  . Intimate partner violence:    Fear of current or ex partner: Not on file    Emotionally abused: Not on file    Physically abused: Not on file    Forced sexual activity: Not on file  Other Topics Concern  . Not on file  Social History Narrative  . Not on file    Past Surgical History:  Procedure Laterality Date  . TUBAL LIGATION      Family History  Problem Relation Age of Onset  . Stroke Father   . Heart attack Sister   . Breast cancer Sister   . Cancer Son        throat and lung  . Cancer Sister        gum    Allergies  Allergen Reactions  . Oysters [Shellfish Allergy] Anaphylaxis  . Penicillins Anaphylaxis    Has patient had a PCN reaction causing immediate rash, facial/tongue/throat swelling, SOB or lightheadedness with hypotension: Yes Has patient had a PCN reaction causing severe  rash involving mucus membranes or skin necrosis: No Has patient had a PCN reaction that required hospitalization No Has patient had a PCN reaction occurring within the last 10 years: No If all of the above answers are "NO", then may proceed with Cephalosporin use.   . Latex Rash       Assessment & Plan:   1. Bilateral carotid artery stenosis Recommend:  Given the patient's asymptomatic subcritical stenosis no further invasive  testing or surgery at this time.  Duplex ultrasound shows 50-69% stenosis on the left, and 1-39% stenosis on the right following post endarterectomy.    Continue antiplatelet therapy as prescribed Continue management of CAD, HTN and Hyperlipidemia Healthy heart diet, encouraged exercise at least 4 times per week Follow up in 6 months with duplex ultrasound and physical exam  - VAS US CAROTID; Future  2. Type 2 diabetes mellitus with hyperglycemia, without long-term current use of insulin (HCC) Continue hypoglycemic medications as already ordered, these medications have been reviewed and there are no changes at this time.  Hgb A1C to be monitored as already arranged by primary service   3. Hyperlipidemia, unspecified hyperlipidemia type Continue statin as ordered and reviewed, no changes at this time   4. Essential hypertension, benign Continue antihypertensive medications as already ordered, these medications have been reviewed and there are no changes at this time.   Current Outpatient Medications on File Prior to Visit  Medication Sig Dispense Refill  . aspirin EC 81 MG tablet Take 81 mg by mouth daily.    Marland Kitchen atorvastatin (LIPITOR) 80 MG tablet Take 80 mg by mouth daily.    . clopidogrel (PLAVIX) 75 MG tablet TAKE 1 TABLET EVERY DAY (Patient taking differently: Take 75 mg by mouth once a day) 90 tablet 1  . ezetimibe (ZETIA) 10 MG tablet Take 1 tablet (10 mg total) by mouth daily. 30 tablet 0  . gabapentin (NEURONTIN) 400 MG capsule TAKE 1 CAPSULE  (400 MG TOTAL) BY MOUTH 3 (THREE) TIMES DAILY. (Patient taking differently: Take 400 mg by mouth two times a day) 270 capsule 3  . glucose blood test strip 1 each by Other route daily. Use as instructed    . lisinopril (PRINIVIL,ZESTRIL) 10 MG tablet TAKE 1 TABLET EVERY DAY 90 tablet 0  . metFORMIN (GLUCOPHAGE) 500 MG tablet TAKE 1 TABLET TWICE DAILY BEFORE A MEAL 180 tablet 0   No current facility-administered medications on file prior to visit.     There are no Patient Instructions on file for this visit. No follow-ups on file.   Kris Hartmann, NP

## 2017-11-25 DIAGNOSIS — I6522 Occlusion and stenosis of left carotid artery: Secondary | ICD-10-CM | POA: Diagnosis not present

## 2017-11-25 DIAGNOSIS — R0683 Snoring: Secondary | ICD-10-CM | POA: Diagnosis not present

## 2017-11-25 DIAGNOSIS — Z8673 Personal history of transient ischemic attack (TIA), and cerebral infarction without residual deficits: Secondary | ICD-10-CM | POA: Diagnosis not present

## 2018-02-03 DIAGNOSIS — E118 Type 2 diabetes mellitus with unspecified complications: Secondary | ICD-10-CM | POA: Diagnosis not present

## 2018-02-03 DIAGNOSIS — I739 Peripheral vascular disease, unspecified: Secondary | ICD-10-CM | POA: Diagnosis not present

## 2018-02-03 DIAGNOSIS — Z23 Encounter for immunization: Secondary | ICD-10-CM | POA: Diagnosis not present

## 2018-02-03 DIAGNOSIS — I10 Essential (primary) hypertension: Secondary | ICD-10-CM | POA: Diagnosis not present

## 2018-02-03 DIAGNOSIS — G8194 Hemiplegia, unspecified affecting left nondominant side: Secondary | ICD-10-CM | POA: Diagnosis not present

## 2018-03-24 ENCOUNTER — Other Ambulatory Visit: Payer: Self-pay

## 2018-03-24 NOTE — Patient Outreach (Signed)
[facility]) Care Management  03/24/2018  DEMISHA NOKES 1950/04/28 [id number]   Referral Date: 03/24/2018 Referral Source: Nurseline Referral Reason:Needing prescriptions transferred to another mail order pharmacy.     Outreach Attempt: No answer. HIPAA compliant voice message left.  CM did note in record that patient has made request to PCP to have medications transferred on yesterday and request was completed by physician office.   Plan: RN CM will attempt patient again within 4 business days and send letter.    Jone Baseman, RN, MSN [facility] Care Management Care Management Coordinator Direct Line [phone] Toll Free: [phone]  Fax: [phone]

## 2018-03-25 ENCOUNTER — Other Ambulatory Visit: Payer: Self-pay

## 2018-03-25 NOTE — Patient Outreach (Signed)
[facility]) Care Management  03/25/2018  Erin Hamilton 10-28-1950 [id number]   Referral Date: 03/24/2018 Referral Source: Nurseline Referral Reason:Needing prescriptions transferred to another mail order pharmacy.     Outreach Attempt: spoke with patient. She is able to verify HIPAA. Discussed reason for referral.  MD has sent prescriptions to  [facility] per patient request. Patient states that she is aware of this.  Asked patient if she had the number to [facility].  She states no. Patient given the number to write down for any questions or concerns about her medications.  Patient declines any other questions or concerns.    Plan: RN CM will close case.  Jone Baseman, RN, MSN [facility] Care Management Care Management Coordinator Direct Line [phone] Cell [phone] Toll Free: [phone]  Fax: [phone]'

## 2018-05-18 ENCOUNTER — Ambulatory Visit (INDEPENDENT_AMBULATORY_CARE_PROVIDER_SITE_OTHER): Payer: PPO | Admitting: Nurse Practitioner

## 2018-05-18 ENCOUNTER — Ambulatory Visit (INDEPENDENT_AMBULATORY_CARE_PROVIDER_SITE_OTHER): Payer: PPO

## 2018-05-18 ENCOUNTER — Encounter (INDEPENDENT_AMBULATORY_CARE_PROVIDER_SITE_OTHER): Payer: Self-pay | Admitting: Nurse Practitioner

## 2018-05-18 ENCOUNTER — Other Ambulatory Visit: Payer: Self-pay

## 2018-05-18 VITALS — BP 137/84 | HR 84 | Resp 16 | Ht 62.0 in | Wt 145.0 lb

## 2018-05-18 DIAGNOSIS — Z7982 Long term (current) use of aspirin: Secondary | ICD-10-CM | POA: Diagnosis not present

## 2018-05-18 DIAGNOSIS — Z7984 Long term (current) use of oral hypoglycemic drugs: Secondary | ICD-10-CM

## 2018-05-18 DIAGNOSIS — E785 Hyperlipidemia, unspecified: Secondary | ICD-10-CM

## 2018-05-18 DIAGNOSIS — Z7902 Long term (current) use of antithrombotics/antiplatelets: Secondary | ICD-10-CM | POA: Diagnosis not present

## 2018-05-18 DIAGNOSIS — I6523 Occlusion and stenosis of bilateral carotid arteries: Secondary | ICD-10-CM

## 2018-05-18 DIAGNOSIS — E119 Type 2 diabetes mellitus without complications: Secondary | ICD-10-CM | POA: Diagnosis not present

## 2018-05-18 DIAGNOSIS — Z79899 Other long term (current) drug therapy: Secondary | ICD-10-CM | POA: Diagnosis not present

## 2018-05-26 ENCOUNTER — Encounter (INDEPENDENT_AMBULATORY_CARE_PROVIDER_SITE_OTHER): Payer: Self-pay | Admitting: Nurse Practitioner

## 2018-05-26 DIAGNOSIS — Z8673 Personal history of transient ischemic attack (TIA), and cerebral infarction without residual deficits: Secondary | ICD-10-CM | POA: Diagnosis not present

## 2018-05-26 DIAGNOSIS — R0683 Snoring: Secondary | ICD-10-CM | POA: Diagnosis not present

## 2018-05-26 NOTE — Progress Notes (Signed)
SUBJECTIVE:  Patient ID: Erin Hamilton, female    DOB: 06/09/1950, 68 y.o.   MRN: [id number] Chief Complaint  Patient presents with  . Follow-up    ultrasound follow up    HPI  Erin Hamilton is a 68 y.o. female The patient is seen for follow up evaluation of carotid stenosis. The carotid stenosis followed by ultrasound.   The patient denies amaurosis fugax. There is no recent history of TIA symptoms or focal motor deficits.  The prior documented CVA on 11/07/2017.  There was also CVA 5 years ago.  Patient had a right carotid endarterectomy approximately 5 years ago.  The patient is taking enteric-coated aspirin 81 mg daily.  There is no history of migraine headaches. There is no history of seizures.  The patient has a history of coronary artery disease, no recent episodes of angina or shortness of breath. The patient denies PAD or claudication symptoms. There is a history of hyperlipidemia which is being treated with a statin.    Carotid Duplex done today shows 1 to 39% bilaterally.  Soft homogenous thrombus seen in the right bulb/proximal ICA.  However this was seen on previous exam on 11/06/2017.  Past Medical History:  Diagnosis Date  . Anxiety   . CVA (cerebral infarction)   . Diabetes mellitus without complication ([place])   . Hyperlipidemia   . Hypertension   . Stroke [facility])     Past Surgical History:  Procedure Laterality Date  . TUBAL LIGATION      Social History   Socioeconomic History  . Marital status: Divorced    Spouse name: Not on file  . Number of children: Not on file  . Years of education: Not on file  . Highest education level: Not on file  Occupational History  . Not on file  Social Needs  . Financial resource strain: Not on file  . Food insecurity:    Worry: Not on file    Inability: Not on file  . Transportation needs:    Medical: Not on file    Non-medical: Not on file  Tobacco Use  . Smoking status: Former Research scientist (life sciences)  . Smokeless tobacco:  Never Used  Substance and Sexual Activity  . Alcohol use: No    Alcohol/week: 0.0 standard drinks  . Drug use: No  . Sexual activity: Never  Lifestyle  . Physical activity:    Days per week: Not on file    Minutes per session: Not on file  . Stress: Not on file  Relationships  . Social connections:    Talks on phone: Not on file    Gets together: Not on file    Attends religious service: Not on file    Active member of club or organization: Not on file    Attends meetings of clubs or organizations: Not on file    Relationship status: Not on file  . Intimate partner violence:    Fear of current or ex partner: Not on file    Emotionally abused: Not on file    Physically abused: Not on file    Forced sexual activity: Not on file  Other Topics Concern  . Not on file  Social History Narrative  . Not on file    Family History  Problem Relation Age of Onset  . Stroke Father   . Heart attack Sister   . Breast cancer Sister   . Cancer Son        throat and lung  .  Cancer Sister        gum    Allergies  Allergen Reactions  . Oysters [Shellfish Allergy] Anaphylaxis  . Penicillins Anaphylaxis    Has patient had a PCN reaction causing immediate rash, facial/tongue/throat swelling, SOB or lightheadedness with hypotension: Yes Has patient had a PCN reaction causing severe rash involving mucus membranes or skin necrosis: No Has patient had a PCN reaction that required hospitalization No Has patient had a PCN reaction occurring within the last 10 years: No If all of the above answers are "NO", then may proceed with Cephalosporin use.   . Latex Rash     Review of Systems   Review of Systems: Negative Unless Checked Constitutional: [] Weight loss  [] Fever  [] Chills Cardiac: [] Chest pain   []  Atrial Fibrillation  [] Palpitations   [] Shortness of breath when laying flat   [] Shortness of breath with exertion. [] Shortness of breath at rest Vascular:  [] Pain in legs with walking    [] Pain in legs with standing [] Pain in legs when laying flat   [] Claudication    [] Pain in feet when laying flat    [] History of DVT   [] Phlebitis   [] Swelling in legs   [] Varicose veins   [] Non-healing ulcers Pulmonary:   [] Uses home oxygen   [] Productive cough   [] Hemoptysis   [] Wheeze  [] COPD   [] Asthma Neurologic:  [] Dizziness   [] Seizures  [] Blackouts [x] History of stroke   [] History of TIA  [] Aphasia   [] Temporary Blindness   [] Weakness or numbness in arm   [x] Weakness or numbness in leg Musculoskeletal:   [] Joint swelling   [] Joint pain   [] Low back pain  []  History of Knee Replacement [] Arthritis [] back Surgeries  []  Spinal Stenosis    Hematologic:  [] Easy bruising  [] Easy bleeding   [] Hypercoagulable state   [] Anemic Gastrointestinal:  [] Diarrhea   [] Vomiting  [] Gastroesophageal reflux/heartburn   [] Difficulty swallowing. [] Abdominal pain Genitourinary:  [] Chronic kidney disease   [] Difficult urination  [] Anuric   [] Blood in urine [] Frequent urination  [] Burning with urination   [] Hematuria Skin:  [] Rashes   [] Ulcers [] Wounds Psychological:  [x] History of anxiety   [x]  History of major depression  []  Memory Difficulties      OBJECTIVE:   Physical Exam  BP 137/84 (BP Location: Right Arm)   Pulse 84   Resp 16   Ht 5\' 2"  (1.575 m)   Wt 145 lb (65.8 kg)   LMP  (LMP Unknown)   BMI 26.52 kg/m   Gen: WD/WN, NAD Head: [place]/AT, No temporalis wasting.  Ear/Nose/Throat: Hearing grossly intact, nares w/o erythema or drainage Eyes: PER, EOMI, sclera nonicteric.  Neck: Supple, no masses.  No JVD.  Pulmonary:  Good air movement, no use of accessory muscles.  Cardiac: RRR Vascular:  No bruits auscultated Vessel Right Left  Radial Palpable Palpable   Gastrointestinal: soft, non-distended. No guarding/no peritoneal signs.  Musculoskeletal: M/S 5/5 throughout.  No deformity or atrophy.  Neurologic: Pain and light touch intact in extremities.  Symmetrical.  Speech is fluent. Motor exam as  listed above. Psychiatric: Judgment intact, Mood & affect appropriate for pt's clinical situation. Dermatologic: No Venous rashes. No Ulcers Noted.  No changes consistent with cellulitis. Lymph : No Cervical lymphadenopathy, no lichenification or skin changes of chronic lymphedema.       ASSESSMENT AND PLAN:  1. Bilateral carotid artery stenosis Recommend:  Given the patient's asymptomatic subcritical stenosis no further invasive testing or surgery at this time.  Duplex ultrasound shows 1 to  39% stenosis bilaterally.  Continue antiplatelet therapy as prescribed Continue management of CAD, HTN and Hyperlipidemia Healthy heart diet,  encouraged exercise at least 4 times per week Follow up in 12 months with duplex ultrasound and physical exam   2. Diabetes mellitus without complication ([place]) Continue hypoglycemic medications as already ordered, these medications have been reviewed and there are no changes at this time.  Hgb A1C to be monitored as already arranged by primary service   3. Hyperlipidemia, unspecified hyperlipidemia type Continue statin as ordered and reviewed, no changes at this time    Current Outpatient Medications on File Prior to Visit  Medication Sig Dispense Refill  . aspirin EC 81 MG tablet Take 81 mg by mouth daily.    Marland Kitchen atorvastatin (LIPITOR) 80 MG tablet Take 80 mg by mouth daily.    . clopidogrel (PLAVIX) 75 MG tablet TAKE 1 TABLET EVERY DAY (Patient taking differently: Take 75 mg by mouth once a day) 90 tablet 1  . ezetimibe (ZETIA) 10 MG tablet Take 1 tablet (10 mg total) by mouth daily. 30 tablet 0  . gabapentin (NEURONTIN) 400 MG capsule TAKE 1 CAPSULE (400 MG TOTAL) BY MOUTH 3 (THREE) TIMES DAILY. (Patient taking differently: Take 400 mg by mouth two times a day) 270 capsule 3  . glucose blood test strip 1 each by Other route daily. Use as instructed    . lisinopril (PRINIVIL,ZESTRIL) 10 MG tablet TAKE 1 TABLET EVERY DAY 90 tablet 0  . metFORMIN  (GLUCOPHAGE) 500 MG tablet TAKE 1 TABLET TWICE DAILY BEFORE A MEAL 180 tablet 0  . simvastatin (ZOCOR) 20 MG tablet Take 20 mg by mouth daily.     No current facility-administered medications on file prior to visit.     There are no Patient Instructions on file for this visit. No follow-ups on file.   Kris Hartmann, NP  This note was completed with Sales executive.  Any errors are purely unintentional.

## 2018-06-09 DIAGNOSIS — I10 Essential (primary) hypertension: Secondary | ICD-10-CM | POA: Diagnosis not present

## 2018-06-09 DIAGNOSIS — I739 Peripheral vascular disease, unspecified: Secondary | ICD-10-CM | POA: Diagnosis not present

## 2018-06-09 DIAGNOSIS — E118 Type 2 diabetes mellitus with unspecified complications: Secondary | ICD-10-CM | POA: Diagnosis not present

## 2018-06-09 DIAGNOSIS — G8194 Hemiplegia, unspecified affecting left nondominant side: Secondary | ICD-10-CM | POA: Diagnosis not present

## 2018-06-29 ENCOUNTER — Other Ambulatory Visit: Payer: Self-pay

## 2018-06-29 NOTE — Patient Outreach (Signed)
[facility] Care Management  06/29/2018  TAUNIA FRASCO [birth year] [id number]   Referral Date: 06/29/2018 Referral Source: [facility] Referral Reason: Patient lost her Plavix bottle   Outreach Attempt: spoke with patient.  She is able to verify HIPAA.  She states that she lost her bottle of Plavix.  She states that she will be calling her physician to request another prescription to be sent to the pharmacy.  Patient declined needing any assistance to contact physician.  Patient declined any further needs at this time.     Plan: RN CM will close case.     Jone Baseman, RN, MSN [facility] Care Management Care Management Coordinator Direct Line [phone] Toll Free: [phone]  Fax: [phone]

## 2018-09-26 NOTE — Patient Outreach (Signed)
This referral was sent to [facility] for review.

## 2018-10-10 DIAGNOSIS — E782 Mixed hyperlipidemia: Secondary | ICD-10-CM | POA: Diagnosis not present

## 2018-10-10 DIAGNOSIS — Z0000 Encounter for general adult medical examination without abnormal findings: Secondary | ICD-10-CM | POA: Diagnosis not present

## 2018-10-10 DIAGNOSIS — I779 Disorder of arteries and arterioles, unspecified: Secondary | ICD-10-CM | POA: Diagnosis not present

## 2018-10-10 DIAGNOSIS — E118 Type 2 diabetes mellitus with unspecified complications: Secondary | ICD-10-CM | POA: Diagnosis not present

## 2018-10-10 DIAGNOSIS — I10 Essential (primary) hypertension: Secondary | ICD-10-CM | POA: Diagnosis not present

## 2018-10-10 DIAGNOSIS — G8194 Hemiplegia, unspecified affecting left nondominant side: Secondary | ICD-10-CM | POA: Diagnosis not present

## 2018-10-14 DIAGNOSIS — Z0000 Encounter for general adult medical examination without abnormal findings: Secondary | ICD-10-CM | POA: Diagnosis not present

## 2018-12-09 DIAGNOSIS — I6523 Occlusion and stenosis of bilateral carotid arteries: Secondary | ICD-10-CM | POA: Diagnosis not present

## 2018-12-09 DIAGNOSIS — Z8673 Personal history of transient ischemic attack (TIA), and cerebral infarction without residual deficits: Secondary | ICD-10-CM | POA: Diagnosis not present

## 2018-12-09 DIAGNOSIS — G8194 Hemiplegia, unspecified affecting left nondominant side: Secondary | ICD-10-CM | POA: Diagnosis not present

## 2019-02-06 DIAGNOSIS — E118 Type 2 diabetes mellitus with unspecified complications: Secondary | ICD-10-CM | POA: Diagnosis not present

## 2019-02-06 DIAGNOSIS — I779 Disorder of arteries and arterioles, unspecified: Secondary | ICD-10-CM | POA: Diagnosis not present

## 2019-02-06 DIAGNOSIS — G8194 Hemiplegia, unspecified affecting left nondominant side: Secondary | ICD-10-CM | POA: Diagnosis not present

## 2019-02-06 DIAGNOSIS — I10 Essential (primary) hypertension: Secondary | ICD-10-CM | POA: Diagnosis not present

## 2019-02-06 DIAGNOSIS — T466X5A Adverse effect of antihyperlipidemic and antiarteriosclerotic drugs, initial encounter: Secondary | ICD-10-CM | POA: Diagnosis not present

## 2019-02-06 DIAGNOSIS — M791 Myalgia, unspecified site: Secondary | ICD-10-CM | POA: Diagnosis not present

## 2019-05-18 ENCOUNTER — Encounter (INDEPENDENT_AMBULATORY_CARE_PROVIDER_SITE_OTHER): Payer: Self-pay | Admitting: Nurse Practitioner

## 2019-05-18 ENCOUNTER — Other Ambulatory Visit: Payer: Self-pay

## 2019-05-18 ENCOUNTER — Ambulatory Visit (INDEPENDENT_AMBULATORY_CARE_PROVIDER_SITE_OTHER): Payer: Medicare HMO

## 2019-05-18 ENCOUNTER — Ambulatory Visit (INDEPENDENT_AMBULATORY_CARE_PROVIDER_SITE_OTHER): Payer: Medicare HMO | Admitting: Nurse Practitioner

## 2019-05-18 VITALS — BP 168/75 | HR 85 | Ht 64.0 in | Wt 141.0 lb

## 2019-05-18 DIAGNOSIS — I6523 Occlusion and stenosis of bilateral carotid arteries: Secondary | ICD-10-CM

## 2019-05-18 DIAGNOSIS — E785 Hyperlipidemia, unspecified: Secondary | ICD-10-CM

## 2019-05-18 DIAGNOSIS — I10 Essential (primary) hypertension: Secondary | ICD-10-CM

## 2019-05-22 ENCOUNTER — Encounter (INDEPENDENT_AMBULATORY_CARE_PROVIDER_SITE_OTHER): Payer: Self-pay | Admitting: Nurse Practitioner

## 2019-05-22 NOTE — Progress Notes (Signed)
SUBJECTIVE:  Patient ID: Erin Hamilton, female    DOB: 08-08-1950, 69 y.o.   MRN: [id number] Chief Complaint  Patient presents with  . Follow-up    U/S follow up    HPI  Erin Hamilton is a 69 y.o. female The patient is seen for follow up evaluation of carotid stenosis. The carotid stenosis followed by ultrasound.   The patient denies amaurosis fugax. There is no recent history of TIA symptoms or focal motor deficits. There is no prior documented on 11/07/2017.  The patient is taking enteric-coated aspirin 81 mg daily.  There is no history of migraine headaches. There is no history of seizures.  The patient has a history of coronary artery disease, no recent episodes of angina or shortness of breath. The patient denies PAD or claudication symptoms. There is a history of hyperlipidemia which is being treated with a statin.    Carotid Duplex done today shows 1 to 39% bilaterally. Soft homogenous thrombus seen in the right bulb/proximal ICA.  However this was seen on previous exam on 11/06/2017 as well as in 2020.  Past Medical History:  Diagnosis Date  . Anxiety   . CVA (cerebral infarction)   . Diabetes mellitus without complication ([place])   . Hyperlipidemia   . Hypertension   . Stroke [facility])     Past Surgical History:  Procedure Laterality Date  . TUBAL LIGATION      Social History   Socioeconomic History  . Marital status: Divorced    Spouse name: Not on file  . Number of children: Not on file  . Years of education: Not on file  . Highest education level: Not on file  Occupational History  . Not on file  Tobacco Use  . Smoking status: Former Research scientist (life sciences)  . Smokeless tobacco: Never Used  Substance and Sexual Activity  . Alcohol use: No    Alcohol/week: 0.0 standard drinks  . Drug use: No  . Sexual activity: Never  Other Topics Concern  . Not on file  Social History Narrative  . Not on file   Social Determinants of Health   Financial Resource Strain:   .  Difficulty of Paying Living Expenses:   Food Insecurity:   . Worried About Charity fundraiser in the Last Year:   . Arboriculturist in the Last Year:   Transportation Needs:   . Film/video editor (Medical):   Marland Kitchen Lack of Transportation (Non-Medical):   Physical Activity:   . Days of Exercise per Week:   . Minutes of Exercise per Session:   Stress:   . Feeling of Stress :   Social Connections:   . Frequency of Communication with Friends and Family:   . Frequency of Social Gatherings with Friends and Family:   . Attends Religious Services:   . Active Member of Clubs or Organizations:   . Attends Archivist Meetings:   Marland Kitchen Marital Status:   Intimate Partner Violence:   . Fear of Current or Ex-Partner:   . Emotionally Abused:   Marland Kitchen Physically Abused:   . Sexually Abused:     Family History  Problem Relation Age of Onset  . Stroke Father   . Heart attack Sister   . Breast cancer Sister   . Cancer Son        throat and lung  . Cancer Sister        gum    Allergies  Allergen Reactions  . Oysters [  Shellfish Allergy] Anaphylaxis  . Penicillins Anaphylaxis    Has patient had a PCN reaction causing immediate rash, facial/tongue/throat swelling, SOB or lightheadedness with hypotension: Yes Has patient had a PCN reaction causing severe rash involving mucus membranes or skin necrosis: No Has patient had a PCN reaction that required hospitalization No Has patient had a PCN reaction occurring within the last 10 years: No If all of the above answers are "NO", then may proceed with Cephalosporin use.   . Latex Rash     Review of Systems   Review of Systems: Negative Unless Checked Constitutional: [] Weight loss  [] Fever  [] Chills Cardiac: [] Chest pain   []  Atrial Fibrillation  [] Palpitations   [] Shortness of breath when laying flat   [] Shortness of breath with exertion. [] Shortness of breath at rest Vascular:  [] Pain in legs with walking   [] Pain in legs with standing  [] Pain in legs when laying flat   [] Claudication    [] Pain in feet when laying flat    [] History of DVT   [] Phlebitis   [] Swelling in legs   [] Varicose veins   [] Non-healing ulcers Pulmonary:   [] Uses home oxygen   [] Productive cough   [] Hemoptysis   [] Wheeze  [] COPD   [] Asthma Neurologic:  [] Dizziness   [] Seizures  [] Blackouts [x] History of stroke   [] History of TIA  [] Aphasia   [] Temporary Blindness   [] Weakness or numbness in arm   [x] Weakness or numbness in leg Musculoskeletal:   [] Joint swelling   [] Joint pain   [] Low back pain  []  History of Knee Replacement [] Arthritis [] back Surgeries  []  Spinal Stenosis    Hematologic:  [] Easy bruising  [] Easy bleeding   [] Hypercoagulable state   [] Anemic Gastrointestinal:  [] Diarrhea   [] Vomiting  [] Gastroesophageal reflux/heartburn   [] Difficulty swallowing. [] Abdominal pain Genitourinary:  [] Chronic kidney disease   [] Difficult urination  [] Anuric   [] Blood in urine [] Frequent urination  [] Burning with urination   [] Hematuria Skin:  [] Rashes   [] Ulcers [] Wounds Psychological:  [x] History of anxiety   [x]  History of major depression  []  Memory Difficulties      OBJECTIVE:   Physical Exam  BP (!) 168/75   Pulse 85   Ht 5\' 4"  (1.626 m)   Wt 141 lb (64 kg)   LMP  (LMP Unknown)   BMI 24.20 kg/m   Gen: WD/WN, NAD Head: [place]/AT, No temporalis wasting.  Ear/Nose/Throat: Hearing grossly intact, nares w/o erythema or drainage Eyes: PER, EOMI, sclera nonicteric.  Neck: Supple, no masses.  No JVD.  Pulmonary:  Good air movement, no use of accessory muscles.  Cardiac: RRR Vascular:  No bruit auscultated Vessel Right Left  Radial Palpable Palpable   Gastrointestinal: soft, non-distended. No guarding/no peritoneal signs.  Musculoskeletal: M/S 5/5 throughout.  No deformity or atrophy.  Neurologic: Pain and light touch intact in extremities.  Symmetrical.  Speech is fluent. Motor exam as listed above. Psychiatric: Judgment intact, Mood & affect  appropriate for pt's clinical situation. Dermatologic: No Venous rashes. No Ulcers Noted.  No changes consistent with cellulitis. Lymph : No Cervical lymphadenopathy, no lichenification or skin changes of chronic lymphedema.       ASSESSMENT AND PLAN:  1. Bilateral carotid artery stenosis Recommend:  Given the patient's asymptomatic subcritical stenosis no further invasive testing or surgery at this time.  Duplex ultrasound shows 1 to 39% stenosis bilaterally.  Continue antiplatelet therapy as prescribed Continue management of CAD, HTN and Hyperlipidemia Healthy heart diet,  encouraged exercise at least 4 times per  week Follow up in 12 months with duplex ultrasound and physical exam   2. Hyperlipidemia, unspecified hyperlipidemia type Good lipid control important for slowing atherosclerotic disease.  Patient on appropriate medications no changes needed.  3. Essential hypertension, benign Blood pressure is slightly elevated today.  Patient is on appropriate medications.  No changes made.  We will continue managed by PCP.   Current Outpatient Medications on File Prior to Visit  Medication Sig Dispense Refill  . aspirin EC 81 MG tablet Take 81 mg by mouth daily.    Marland Kitchen atorvastatin (LIPITOR) 80 MG tablet Take 80 mg by mouth daily.    . clopidogrel (PLAVIX) 75 MG tablet TAKE 1 TABLET EVERY DAY (Patient taking differently: Take 75 mg by mouth once a day) 90 tablet 1  . ezetimibe (ZETIA) 10 MG tablet Take 1 tablet (10 mg total) by mouth daily. 30 tablet 0  . gabapentin (NEURONTIN) 400 MG capsule TAKE 1 CAPSULE (400 MG TOTAL) BY MOUTH 3 (THREE) TIMES DAILY. (Patient taking differently: Take 400 mg by mouth two times a day) 270 capsule 3  . glucose blood test strip 1 each by Other route daily. Use as instructed    . lisinopril (PRINIVIL,ZESTRIL) 10 MG tablet TAKE 1 TABLET EVERY DAY 90 tablet 0  . metFORMIN (GLUCOPHAGE) 500 MG tablet TAKE 1 TABLET TWICE DAILY BEFORE A MEAL 180 tablet 0  .  simvastatin (ZOCOR) 20 MG tablet Take 20 mg by mouth daily.     No current facility-administered medications on file prior to visit.    There are no Patient Instructions on file for this visit. Return in about 1 year (around 05/17/2020).   Kris Hartmann, NP  This note was completed with Sales executive.  Any errors are purely unintentional.

## 2019-06-13 DIAGNOSIS — I10 Essential (primary) hypertension: Secondary | ICD-10-CM | POA: Diagnosis not present

## 2019-06-13 DIAGNOSIS — R413 Other amnesia: Secondary | ICD-10-CM | POA: Diagnosis not present

## 2019-06-13 DIAGNOSIS — L989 Disorder of the skin and subcutaneous tissue, unspecified: Secondary | ICD-10-CM | POA: Diagnosis not present

## 2019-06-13 DIAGNOSIS — G8194 Hemiplegia, unspecified affecting left nondominant side: Secondary | ICD-10-CM | POA: Diagnosis not present

## 2019-06-13 DIAGNOSIS — I779 Disorder of arteries and arterioles, unspecified: Secondary | ICD-10-CM | POA: Diagnosis not present

## 2019-06-13 DIAGNOSIS — R350 Frequency of micturition: Secondary | ICD-10-CM | POA: Diagnosis not present

## 2019-06-13 DIAGNOSIS — E118 Type 2 diabetes mellitus with unspecified complications: Secondary | ICD-10-CM | POA: Diagnosis not present

## 2019-06-15 DIAGNOSIS — E539 Vitamin B deficiency, unspecified: Secondary | ICD-10-CM | POA: Diagnosis not present

## 2019-07-05 ENCOUNTER — Other Ambulatory Visit: Payer: Self-pay

## 2019-07-05 ENCOUNTER — Ambulatory Visit: Payer: Medicare HMO | Admitting: Dermatology

## 2019-07-05 DIAGNOSIS — L578 Other skin changes due to chronic exposure to nonionizing radiation: Secondary | ICD-10-CM | POA: Diagnosis not present

## 2019-07-05 DIAGNOSIS — R222 Localized swelling, mass and lump, trunk: Secondary | ICD-10-CM | POA: Diagnosis not present

## 2019-07-05 DIAGNOSIS — L821 Other seborrheic keratosis: Secondary | ICD-10-CM | POA: Diagnosis not present

## 2019-07-05 DIAGNOSIS — D485 Neoplasm of uncertain behavior of skin: Secondary | ICD-10-CM

## 2019-07-05 DIAGNOSIS — C44529 Squamous cell carcinoma of skin of other part of trunk: Secondary | ICD-10-CM | POA: Diagnosis not present

## 2019-07-05 DIAGNOSIS — C4492 Squamous cell carcinoma of skin, unspecified: Secondary | ICD-10-CM

## 2019-07-05 DIAGNOSIS — Z87891 Personal history of nicotine dependence: Secondary | ICD-10-CM | POA: Diagnosis not present

## 2019-07-05 DIAGNOSIS — R918 Other nonspecific abnormal finding of lung field: Secondary | ICD-10-CM | POA: Diagnosis not present

## 2019-07-05 HISTORY — DX: Squamous cell carcinoma of skin, unspecified: C44.92

## 2019-07-05 NOTE — Progress Notes (Signed)
   New Patient Visit  Subjective  Erin Hamilton is a 69 y.o. female who presents for the following: Skin Problem.  Patient here today for a spot on her chest that has been there for years. It does itch and she has scratched it off then it will bleed. Patient's daughter is with her today and advises that there is a spot on her left arm and face as well.  The following portions of the chart were reviewed this encounter and updated as appropriate:     Review of Systems:  No other skin or systemic complaints except as noted in HPI or Assessment and Plan.  Objective  Well appearing patient in no apparent distress; mood and affect are within normal limits.  A focused examination was performed including chest, arms. Relevant physical exam findings are noted in the Assessment and Plan. Arms examined but no lesion noted.  Objective  Upper Sternum: 3.5cm pearly patch with keratotic papule     Objective  Right temple: Waxy tan patch   Assessment & Plan  Neoplasm of uncertain behavior of skin Upper Sternum  Skin / nail biopsy Type of biopsy: tangential   Informed consent: discussed and consent obtained   Patient was prepped and draped in usual sterile fashion: Area prepped with alcohol. Anesthesia: the lesion was anesthetized in a standard fashion   Anesthetic:  1% lidocaine w/ epinephrine 1-100,000 buffered w/ 8.4% NaHCO3 Instrument used: flexible razor blade   Hemostasis achieved with: pressure, aluminum chloride and electrodesiccation   Outcome: patient tolerated procedure well   Post-procedure details: wound care instructions given   Post-procedure details comment:  Ointment and small bandage applied  Specimen 1 - Surgical pathology Differential Diagnosis: r/o BCC/SCC Check Margins: No 3.5cm pearly patch with keratotic papule  Seborrheic keratosis Right temple  Benign, observe.   Actinic Damage - face, chest - Recommend daily broad spectrum sunscreen SPF 30+ to  sun-exposed areas, reapply every 2 hours as needed.  - Call for new or changing lesions.  Return pending biopsy results.   Graciella Belton, RMA, am acting as scribe for Brendolyn Patty, MD .  Documentation: I have reviewed the above documentation for accuracy and completeness, and I agree with the above.  Brendolyn Patty, MD

## 2019-07-05 NOTE — Patient Instructions (Signed)

## 2019-07-12 ENCOUNTER — Telehealth: Payer: Self-pay

## 2019-07-12 NOTE — Telephone Encounter (Signed)
Discussed biopsy results with pt daughter, scheduled return appt

## 2019-07-13 DIAGNOSIS — I6523 Occlusion and stenosis of bilateral carotid arteries: Secondary | ICD-10-CM | POA: Diagnosis not present

## 2019-07-13 DIAGNOSIS — I639 Cerebral infarction, unspecified: Secondary | ICD-10-CM | POA: Diagnosis not present

## 2019-07-13 DIAGNOSIS — F0151 Vascular dementia with behavioral disturbance: Secondary | ICD-10-CM | POA: Diagnosis not present

## 2019-07-13 DIAGNOSIS — R0683 Snoring: Secondary | ICD-10-CM | POA: Diagnosis not present

## 2019-07-13 DIAGNOSIS — G8194 Hemiplegia, unspecified affecting left nondominant side: Secondary | ICD-10-CM | POA: Diagnosis not present

## 2019-07-13 DIAGNOSIS — G8191 Hemiplegia, unspecified affecting right dominant side: Secondary | ICD-10-CM | POA: Diagnosis not present

## 2019-07-17 ENCOUNTER — Other Ambulatory Visit: Payer: Self-pay | Admitting: Neurology

## 2019-07-17 DIAGNOSIS — E539 Vitamin B deficiency, unspecified: Secondary | ICD-10-CM | POA: Diagnosis not present

## 2019-07-17 DIAGNOSIS — I639 Cerebral infarction, unspecified: Secondary | ICD-10-CM

## 2019-07-19 DIAGNOSIS — I6522 Occlusion and stenosis of left carotid artery: Secondary | ICD-10-CM | POA: Diagnosis not present

## 2019-07-19 DIAGNOSIS — E119 Type 2 diabetes mellitus without complications: Secondary | ICD-10-CM | POA: Diagnosis not present

## 2019-07-19 DIAGNOSIS — I69354 Hemiplegia and hemiparesis following cerebral infarction affecting left non-dominant side: Secondary | ICD-10-CM | POA: Diagnosis not present

## 2019-07-19 DIAGNOSIS — E782 Mixed hyperlipidemia: Secondary | ICD-10-CM | POA: Diagnosis not present

## 2019-07-19 DIAGNOSIS — F0151 Vascular dementia with behavioral disturbance: Secondary | ICD-10-CM | POA: Diagnosis not present

## 2019-07-19 DIAGNOSIS — I10 Essential (primary) hypertension: Secondary | ICD-10-CM | POA: Diagnosis not present

## 2019-07-19 DIAGNOSIS — I69328 Other speech and language deficits following cerebral infarction: Secondary | ICD-10-CM | POA: Diagnosis not present

## 2019-07-19 DIAGNOSIS — Z7984 Long term (current) use of oral hypoglycemic drugs: Secondary | ICD-10-CM | POA: Diagnosis not present

## 2019-07-19 DIAGNOSIS — I69318 Other symptoms and signs involving cognitive functions following cerebral infarction: Secondary | ICD-10-CM | POA: Diagnosis not present

## 2019-07-24 DIAGNOSIS — I6522 Occlusion and stenosis of left carotid artery: Secondary | ICD-10-CM | POA: Diagnosis not present

## 2019-07-24 DIAGNOSIS — Z7984 Long term (current) use of oral hypoglycemic drugs: Secondary | ICD-10-CM | POA: Diagnosis not present

## 2019-07-24 DIAGNOSIS — F0151 Vascular dementia with behavioral disturbance: Secondary | ICD-10-CM | POA: Diagnosis not present

## 2019-07-24 DIAGNOSIS — I69318 Other symptoms and signs involving cognitive functions following cerebral infarction: Secondary | ICD-10-CM | POA: Diagnosis not present

## 2019-07-24 DIAGNOSIS — I10 Essential (primary) hypertension: Secondary | ICD-10-CM | POA: Diagnosis not present

## 2019-07-24 DIAGNOSIS — I69354 Hemiplegia and hemiparesis following cerebral infarction affecting left non-dominant side: Secondary | ICD-10-CM | POA: Diagnosis not present

## 2019-07-24 DIAGNOSIS — E119 Type 2 diabetes mellitus without complications: Secondary | ICD-10-CM | POA: Diagnosis not present

## 2019-07-24 DIAGNOSIS — E782 Mixed hyperlipidemia: Secondary | ICD-10-CM | POA: Diagnosis not present

## 2019-07-24 DIAGNOSIS — I69328 Other speech and language deficits following cerebral infarction: Secondary | ICD-10-CM | POA: Diagnosis not present

## 2019-07-27 DIAGNOSIS — F0151 Vascular dementia with behavioral disturbance: Secondary | ICD-10-CM | POA: Diagnosis not present

## 2019-07-27 DIAGNOSIS — E119 Type 2 diabetes mellitus without complications: Secondary | ICD-10-CM | POA: Diagnosis not present

## 2019-07-27 DIAGNOSIS — Z7984 Long term (current) use of oral hypoglycemic drugs: Secondary | ICD-10-CM | POA: Diagnosis not present

## 2019-07-27 DIAGNOSIS — I6522 Occlusion and stenosis of left carotid artery: Secondary | ICD-10-CM | POA: Diagnosis not present

## 2019-07-27 DIAGNOSIS — I69318 Other symptoms and signs involving cognitive functions following cerebral infarction: Secondary | ICD-10-CM | POA: Diagnosis not present

## 2019-07-27 DIAGNOSIS — I10 Essential (primary) hypertension: Secondary | ICD-10-CM | POA: Diagnosis not present

## 2019-07-27 DIAGNOSIS — E782 Mixed hyperlipidemia: Secondary | ICD-10-CM | POA: Diagnosis not present

## 2019-07-27 DIAGNOSIS — I69328 Other speech and language deficits following cerebral infarction: Secondary | ICD-10-CM | POA: Diagnosis not present

## 2019-07-27 DIAGNOSIS — I69354 Hemiplegia and hemiparesis following cerebral infarction affecting left non-dominant side: Secondary | ICD-10-CM | POA: Diagnosis not present

## 2019-07-28 ENCOUNTER — Other Ambulatory Visit: Payer: Self-pay

## 2019-07-28 ENCOUNTER — Ambulatory Visit
Admission: RE | Admit: 2019-07-28 | Discharge: 2019-07-28 | Disposition: A | Discharge status: Home / Self Care | Payer: Medicare HMO | Source: Ambulatory Visit | Attending: Neurology | Admitting: Neurology

## 2019-07-28 DIAGNOSIS — I639 Cerebral infarction, unspecified: Secondary | ICD-10-CM | POA: Diagnosis not present

## 2019-07-31 DIAGNOSIS — I6522 Occlusion and stenosis of left carotid artery: Secondary | ICD-10-CM | POA: Diagnosis not present

## 2019-07-31 DIAGNOSIS — E119 Type 2 diabetes mellitus without complications: Secondary | ICD-10-CM | POA: Diagnosis not present

## 2019-07-31 DIAGNOSIS — I69328 Other speech and language deficits following cerebral infarction: Secondary | ICD-10-CM | POA: Diagnosis not present

## 2019-07-31 DIAGNOSIS — F0151 Vascular dementia with behavioral disturbance: Secondary | ICD-10-CM | POA: Diagnosis not present

## 2019-07-31 DIAGNOSIS — I69354 Hemiplegia and hemiparesis following cerebral infarction affecting left non-dominant side: Secondary | ICD-10-CM | POA: Diagnosis not present

## 2019-07-31 DIAGNOSIS — I69318 Other symptoms and signs involving cognitive functions following cerebral infarction: Secondary | ICD-10-CM | POA: Diagnosis not present

## 2019-07-31 DIAGNOSIS — I10 Essential (primary) hypertension: Secondary | ICD-10-CM | POA: Diagnosis not present

## 2019-07-31 DIAGNOSIS — Z7984 Long term (current) use of oral hypoglycemic drugs: Secondary | ICD-10-CM | POA: Diagnosis not present

## 2019-07-31 DIAGNOSIS — E782 Mixed hyperlipidemia: Secondary | ICD-10-CM | POA: Diagnosis not present

## 2019-08-02 DIAGNOSIS — Z7984 Long term (current) use of oral hypoglycemic drugs: Secondary | ICD-10-CM | POA: Diagnosis not present

## 2019-08-02 DIAGNOSIS — I10 Essential (primary) hypertension: Secondary | ICD-10-CM | POA: Diagnosis not present

## 2019-08-02 DIAGNOSIS — I69328 Other speech and language deficits following cerebral infarction: Secondary | ICD-10-CM | POA: Diagnosis not present

## 2019-08-02 DIAGNOSIS — I69318 Other symptoms and signs involving cognitive functions following cerebral infarction: Secondary | ICD-10-CM | POA: Diagnosis not present

## 2019-08-02 DIAGNOSIS — I69354 Hemiplegia and hemiparesis following cerebral infarction affecting left non-dominant side: Secondary | ICD-10-CM | POA: Diagnosis not present

## 2019-08-02 DIAGNOSIS — E119 Type 2 diabetes mellitus without complications: Secondary | ICD-10-CM | POA: Diagnosis not present

## 2019-08-02 DIAGNOSIS — F0151 Vascular dementia with behavioral disturbance: Secondary | ICD-10-CM | POA: Diagnosis not present

## 2019-08-02 DIAGNOSIS — E782 Mixed hyperlipidemia: Secondary | ICD-10-CM | POA: Diagnosis not present

## 2019-08-02 DIAGNOSIS — I6522 Occlusion and stenosis of left carotid artery: Secondary | ICD-10-CM | POA: Diagnosis not present

## 2019-08-07 DIAGNOSIS — E782 Mixed hyperlipidemia: Secondary | ICD-10-CM | POA: Diagnosis not present

## 2019-08-07 DIAGNOSIS — I69328 Other speech and language deficits following cerebral infarction: Secondary | ICD-10-CM | POA: Diagnosis not present

## 2019-08-07 DIAGNOSIS — I6522 Occlusion and stenosis of left carotid artery: Secondary | ICD-10-CM | POA: Diagnosis not present

## 2019-08-07 DIAGNOSIS — I69318 Other symptoms and signs involving cognitive functions following cerebral infarction: Secondary | ICD-10-CM | POA: Diagnosis not present

## 2019-08-07 DIAGNOSIS — E119 Type 2 diabetes mellitus without complications: Secondary | ICD-10-CM | POA: Diagnosis not present

## 2019-08-07 DIAGNOSIS — Z7984 Long term (current) use of oral hypoglycemic drugs: Secondary | ICD-10-CM | POA: Diagnosis not present

## 2019-08-07 DIAGNOSIS — I69354 Hemiplegia and hemiparesis following cerebral infarction affecting left non-dominant side: Secondary | ICD-10-CM | POA: Diagnosis not present

## 2019-08-07 DIAGNOSIS — F0151 Vascular dementia with behavioral disturbance: Secondary | ICD-10-CM | POA: Diagnosis not present

## 2019-08-07 DIAGNOSIS — I10 Essential (primary) hypertension: Secondary | ICD-10-CM | POA: Diagnosis not present

## 2019-08-09 ENCOUNTER — Other Ambulatory Visit: Payer: Self-pay

## 2019-08-09 ENCOUNTER — Ambulatory Visit: Payer: Medicare HMO | Admitting: Dermatology

## 2019-08-09 DIAGNOSIS — C44529 Squamous cell carcinoma of skin of other part of trunk: Secondary | ICD-10-CM

## 2019-08-09 DIAGNOSIS — C4492 Squamous cell carcinoma of skin, unspecified: Secondary | ICD-10-CM

## 2019-08-09 DIAGNOSIS — L304 Erythema intertrigo: Secondary | ICD-10-CM

## 2019-08-09 MED ORDER — KETOCONAZOLE 2 % EX CREA: TOPICAL_CREAM | CUTANEOUS | 2 refills | Status: DC

## 2019-08-09 MED ORDER — MUPIROCIN 2 % EX OINT: 1.0000 "application " | TOPICAL_OINTMENT | Freq: Two times a day (BID) | CUTANEOUS | 2 refills | Status: DC

## 2019-08-09 NOTE — Patient Instructions (Signed)

## 2019-08-09 NOTE — Progress Notes (Signed)
   Follow-Up Visit   Subjective  Erin Hamilton is a 69 y.o. female who presents for the following: Procedure.  Patient is here to day for ED & C of SCC on upper sternum.  She also has an itchy rash under her right breast.  The following portions of the chart were reviewed this encounter and updated as appropriate:      Review of Systems:  No other skin or systemic complaints except as noted in HPI or Assessment and Plan.  Objective  Well appearing patient in no apparent distress; mood and affect are within normal limits.  A focused examination was performed including Upper Sternum. Relevant physical exam findings are noted in the Assessment and Plan.  Objective  Right Inframammary: Hyperpigmented patch with mild erythema  Objective  Upper Sternum: Pink scaly patch   Assessment & Plan  Erythema intertrigo Right Inframammary  Start Ketoconazole apply once to twice daily to affected area under breasts as needed for rash.  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 Anesthesia: the lesion was anesthetized in a standard fashion   Anesthetic:  1% lidocaine w/ epinephrine 1-100,000 buffered w/ 8.4% NaHCO3 Curettage performed in three different directions: Yes   Electrodesiccation performed over the curetted area: Yes   Lesion length (cm):  3.5 Lesion width (cm):  3.5 Margin per side (cm):  0.2 Final wound size (cm):  3.9 Hemostasis achieved with:  pressure, aluminum chloride and electrodesiccation Outcome: patient tolerated procedure well with no complications   Post-procedure details: wound care instructions given     Additional details:  Mupirocin ointment and Bandaid applied    mupirocin ointment (BACTROBAN) 2 %  Return in about 6 months (around 02/08/2020) for UBSE.  I, Donzetta Kohut, CMA, am acting as scribe for Brendolyn Patty, MD .  Documentation: I have reviewed the above documentation for accuracy and completeness, and I agree with the above.  Brendolyn Patty MD

## 2019-08-10 DIAGNOSIS — I6522 Occlusion and stenosis of left carotid artery: Secondary | ICD-10-CM | POA: Diagnosis not present

## 2019-08-10 DIAGNOSIS — E782 Mixed hyperlipidemia: Secondary | ICD-10-CM | POA: Diagnosis not present

## 2019-08-10 DIAGNOSIS — Z7984 Long term (current) use of oral hypoglycemic drugs: Secondary | ICD-10-CM | POA: Diagnosis not present

## 2019-08-10 DIAGNOSIS — I10 Essential (primary) hypertension: Secondary | ICD-10-CM | POA: Diagnosis not present

## 2019-08-10 DIAGNOSIS — I69328 Other speech and language deficits following cerebral infarction: Secondary | ICD-10-CM | POA: Diagnosis not present

## 2019-08-10 DIAGNOSIS — E119 Type 2 diabetes mellitus without complications: Secondary | ICD-10-CM | POA: Diagnosis not present

## 2019-08-10 DIAGNOSIS — I69354 Hemiplegia and hemiparesis following cerebral infarction affecting left non-dominant side: Secondary | ICD-10-CM | POA: Diagnosis not present

## 2019-08-10 DIAGNOSIS — I69318 Other symptoms and signs involving cognitive functions following cerebral infarction: Secondary | ICD-10-CM | POA: Diagnosis not present

## 2019-08-10 DIAGNOSIS — F0151 Vascular dementia with behavioral disturbance: Secondary | ICD-10-CM | POA: Diagnosis not present

## 2019-08-14 DIAGNOSIS — I69354 Hemiplegia and hemiparesis following cerebral infarction affecting left non-dominant side: Secondary | ICD-10-CM | POA: Diagnosis not present

## 2019-08-14 DIAGNOSIS — E782 Mixed hyperlipidemia: Secondary | ICD-10-CM | POA: Diagnosis not present

## 2019-08-14 DIAGNOSIS — I69318 Other symptoms and signs involving cognitive functions following cerebral infarction: Secondary | ICD-10-CM | POA: Diagnosis not present

## 2019-08-14 DIAGNOSIS — E119 Type 2 diabetes mellitus without complications: Secondary | ICD-10-CM | POA: Diagnosis not present

## 2019-08-14 DIAGNOSIS — I6522 Occlusion and stenosis of left carotid artery: Secondary | ICD-10-CM | POA: Diagnosis not present

## 2019-08-14 DIAGNOSIS — I69328 Other speech and language deficits following cerebral infarction: Secondary | ICD-10-CM | POA: Diagnosis not present

## 2019-08-14 DIAGNOSIS — F0151 Vascular dementia with behavioral disturbance: Secondary | ICD-10-CM | POA: Diagnosis not present

## 2019-08-14 DIAGNOSIS — Z7984 Long term (current) use of oral hypoglycemic drugs: Secondary | ICD-10-CM | POA: Diagnosis not present

## 2019-08-14 DIAGNOSIS — I10 Essential (primary) hypertension: Secondary | ICD-10-CM | POA: Diagnosis not present

## 2019-08-23 DIAGNOSIS — E539 Vitamin B deficiency, unspecified: Secondary | ICD-10-CM | POA: Diagnosis not present

## 2019-08-28 DIAGNOSIS — R39198 Other difficulties with micturition: Secondary | ICD-10-CM | POA: Diagnosis not present

## 2019-08-28 DIAGNOSIS — R8290 Unspecified abnormal findings in urine: Secondary | ICD-10-CM | POA: Diagnosis not present

## 2019-08-28 DIAGNOSIS — R350 Frequency of micturition: Secondary | ICD-10-CM | POA: Diagnosis not present

## 2019-09-02 DIAGNOSIS — R350 Frequency of micturition: Secondary | ICD-10-CM | POA: Diagnosis not present

## 2019-09-05 ENCOUNTER — Emergency Department
Admission: EM | Admit: 2019-09-05 | Discharge: 2019-09-05 | Discharge status: Against Medical Advice | Payer: Medicare HMO

## 2019-09-05 NOTE — ED Triage Notes (Signed)
Called in lobby and outside at [place] and [address], no response

## 2019-09-13 DIAGNOSIS — E538 Deficiency of other specified B group vitamins: Secondary | ICD-10-CM | POA: Diagnosis not present

## 2019-09-29 DIAGNOSIS — E538 Deficiency of other specified B group vitamins: Secondary | ICD-10-CM | POA: Diagnosis not present

## 2019-09-29 DIAGNOSIS — E539 Vitamin B deficiency, unspecified: Secondary | ICD-10-CM | POA: Diagnosis not present

## 2019-10-30 DIAGNOSIS — E538 Deficiency of other specified B group vitamins: Secondary | ICD-10-CM | POA: Diagnosis not present

## 2019-10-30 DIAGNOSIS — E539 Vitamin B deficiency, unspecified: Secondary | ICD-10-CM | POA: Diagnosis not present

## 2019-11-23 DIAGNOSIS — M79675 Pain in left toe(s): Secondary | ICD-10-CM | POA: Diagnosis not present

## 2019-11-23 DIAGNOSIS — M79674 Pain in right toe(s): Secondary | ICD-10-CM | POA: Diagnosis not present

## 2019-11-23 DIAGNOSIS — B351 Tinea unguium: Secondary | ICD-10-CM | POA: Diagnosis not present

## 2019-11-23 DIAGNOSIS — L600 Ingrowing nail: Secondary | ICD-10-CM | POA: Diagnosis not present

## 2019-11-23 DIAGNOSIS — E119 Type 2 diabetes mellitus without complications: Secondary | ICD-10-CM | POA: Diagnosis not present

## 2019-11-27 DIAGNOSIS — G8194 Hemiplegia, unspecified affecting left nondominant side: Secondary | ICD-10-CM | POA: Diagnosis not present

## 2019-11-30 DIAGNOSIS — E539 Vitamin B deficiency, unspecified: Secondary | ICD-10-CM | POA: Diagnosis not present

## 2019-11-30 DIAGNOSIS — Z135 Encounter for screening for eye and ear disorders: Secondary | ICD-10-CM | POA: Diagnosis not present

## 2019-12-06 DIAGNOSIS — E782 Mixed hyperlipidemia: Secondary | ICD-10-CM | POA: Diagnosis not present

## 2019-12-06 DIAGNOSIS — E118 Type 2 diabetes mellitus with unspecified complications: Secondary | ICD-10-CM | POA: Diagnosis not present

## 2019-12-06 DIAGNOSIS — Z1329 Encounter for screening for other suspected endocrine disorder: Secondary | ICD-10-CM | POA: Diagnosis not present

## 2019-12-06 DIAGNOSIS — I10 Essential (primary) hypertension: Secondary | ICD-10-CM | POA: Diagnosis not present

## 2019-12-08 DIAGNOSIS — I69318 Other symptoms and signs involving cognitive functions following cerebral infarction: Secondary | ICD-10-CM | POA: Diagnosis not present

## 2019-12-08 DIAGNOSIS — I129 Hypertensive chronic kidney disease with stage 1 through stage 4 chronic kidney disease, or unspecified chronic kidney disease: Secondary | ICD-10-CM | POA: Diagnosis not present

## 2019-12-08 DIAGNOSIS — N189 Chronic kidney disease, unspecified: Secondary | ICD-10-CM | POA: Diagnosis not present

## 2019-12-08 DIAGNOSIS — E1122 Type 2 diabetes mellitus with diabetic chronic kidney disease: Secondary | ICD-10-CM | POA: Diagnosis not present

## 2019-12-08 DIAGNOSIS — F0150 Vascular dementia without behavioral disturbance: Secondary | ICD-10-CM | POA: Diagnosis not present

## 2019-12-08 DIAGNOSIS — I69354 Hemiplegia and hemiparesis following cerebral infarction affecting left non-dominant side: Secondary | ICD-10-CM | POA: Diagnosis not present

## 2019-12-08 DIAGNOSIS — E782 Mixed hyperlipidemia: Secondary | ICD-10-CM | POA: Diagnosis not present

## 2019-12-08 DIAGNOSIS — I69322 Dysarthria following cerebral infarction: Secondary | ICD-10-CM | POA: Diagnosis not present

## 2019-12-08 DIAGNOSIS — M5442 Lumbago with sciatica, left side: Secondary | ICD-10-CM | POA: Diagnosis not present

## 2019-12-11 DIAGNOSIS — F0150 Vascular dementia without behavioral disturbance: Secondary | ICD-10-CM | POA: Diagnosis not present

## 2019-12-11 DIAGNOSIS — E1122 Type 2 diabetes mellitus with diabetic chronic kidney disease: Secondary | ICD-10-CM | POA: Diagnosis not present

## 2019-12-11 DIAGNOSIS — I69322 Dysarthria following cerebral infarction: Secondary | ICD-10-CM | POA: Diagnosis not present

## 2019-12-11 DIAGNOSIS — I69318 Other symptoms and signs involving cognitive functions following cerebral infarction: Secondary | ICD-10-CM | POA: Diagnosis not present

## 2019-12-11 DIAGNOSIS — M5442 Lumbago with sciatica, left side: Secondary | ICD-10-CM | POA: Diagnosis not present

## 2019-12-11 DIAGNOSIS — E782 Mixed hyperlipidemia: Secondary | ICD-10-CM | POA: Diagnosis not present

## 2019-12-11 DIAGNOSIS — N189 Chronic kidney disease, unspecified: Secondary | ICD-10-CM | POA: Diagnosis not present

## 2019-12-11 DIAGNOSIS — I69354 Hemiplegia and hemiparesis following cerebral infarction affecting left non-dominant side: Secondary | ICD-10-CM | POA: Diagnosis not present

## 2019-12-11 DIAGNOSIS — I129 Hypertensive chronic kidney disease with stage 1 through stage 4 chronic kidney disease, or unspecified chronic kidney disease: Secondary | ICD-10-CM | POA: Diagnosis not present

## 2019-12-13 DIAGNOSIS — N1831 Chronic kidney disease, stage 3a: Secondary | ICD-10-CM | POA: Diagnosis not present

## 2019-12-13 DIAGNOSIS — I129 Hypertensive chronic kidney disease with stage 1 through stage 4 chronic kidney disease, or unspecified chronic kidney disease: Secondary | ICD-10-CM | POA: Diagnosis not present

## 2019-12-13 DIAGNOSIS — E1122 Type 2 diabetes mellitus with diabetic chronic kidney disease: Secondary | ICD-10-CM | POA: Diagnosis not present

## 2019-12-13 DIAGNOSIS — Z993 Dependence on wheelchair: Secondary | ICD-10-CM | POA: Insufficient documentation

## 2019-12-13 DIAGNOSIS — I779 Disorder of arteries and arterioles, unspecified: Secondary | ICD-10-CM | POA: Diagnosis not present

## 2019-12-13 DIAGNOSIS — G8194 Hemiplegia, unspecified affecting left nondominant side: Secondary | ICD-10-CM | POA: Diagnosis not present

## 2019-12-13 DIAGNOSIS — Z23 Encounter for immunization: Secondary | ICD-10-CM | POA: Diagnosis not present

## 2020-02-13 ENCOUNTER — Other Ambulatory Visit: Payer: Self-pay

## 2020-02-13 ENCOUNTER — Ambulatory Visit: Payer: Medicare HMO | Admitting: Dermatology

## 2020-02-13 ENCOUNTER — Encounter: Payer: Self-pay | Admitting: Dermatology

## 2020-02-13 DIAGNOSIS — Z85828 Personal history of other malignant neoplasm of skin: Secondary | ICD-10-CM | POA: Diagnosis not present

## 2020-02-13 DIAGNOSIS — L304 Erythema intertrigo: Secondary | ICD-10-CM | POA: Diagnosis not present

## 2020-02-13 DIAGNOSIS — L820 Inflamed seborrheic keratosis: Secondary | ICD-10-CM

## 2020-02-13 NOTE — Progress Notes (Signed)
   Follow-Up Visit   Subjective  Erin Hamilton is a 69 y.o. female who presents for the following: history of SCC (Of the upper sternum - previously tx with ED&C. Resolved with no evidence of recurrence per patient. ). The rash under her breast did resolve with Ketoconazole 2% cream. She has noticed a lesion on her right temple that she is concerned about and picks at.   The following portions of the chart were reviewed this encounter and updated as appropriate:      Review of Systems:  No other skin or systemic complaints except as noted in HPI or Assessment and Plan.  Objective  Well appearing patient in no apparent distress; mood and affect are within normal limits.  A focused examination was performed including the trunk. Relevant physical exam findings are noted in the Assessment and Plan.  Objective  Upper sternum: Well healed scar with no evidence of recurrence   Objective  B/L inframammary: Hyperpigmented patches, otherwise clear.  Objective  R temple x 1: Erythematous keratotic or waxy stuck-on papule   Assessment & Plan  History of SCC (squamous cell carcinoma) of skin Upper sternum  Clear. Observe for recurrence. Call clinic for new or changing lesions.  Recommend regular skin exams, daily broad-spectrum spf 30+ sunscreen use, and photoprotection.     Erythema intertrigo B/L inframammary  Improved  Continue Ketoconazole 2% cream QD-BID PRN flares.   Other Related Medications ketoconazole (NIZORAL) 2 % cream  Inflamed seborrheic keratosis R temple x 1    Destruction of lesion - R temple x 1  Destruction method: cryotherapy   Informed consent: discussed and consent obtained   Timeout:  patient name, date of birth, surgical site, and procedure verified Lesion destroyed using liquid nitrogen: Yes   Region frozen until ice ball extended beyond lesion: Yes   Outcome: patient tolerated procedure well with no complications   Post-procedure details: wound  care instructions given    Return in about 6 months (around 08/13/2020) for TBSE - hx SCC .  Luther Redo, CMA, am acting as scribe for Brendolyn Patty, MD .  Documentation: I have reviewed the above documentation for accuracy and completeness, and I agree with the above.  Brendolyn Patty MD

## 2020-02-14 DIAGNOSIS — R399 Unspecified symptoms and signs involving the genitourinary system: Secondary | ICD-10-CM | POA: Diagnosis not present

## 2020-02-14 DIAGNOSIS — N3000 Acute cystitis without hematuria: Secondary | ICD-10-CM | POA: Diagnosis not present

## 2020-02-14 DIAGNOSIS — R231 Pallor: Secondary | ICD-10-CM | POA: Diagnosis not present

## 2020-03-22 ENCOUNTER — Emergency Department
Admission: EM | Admit: 2020-03-22 | Discharge: 2020-03-22 | Disposition: A | Discharge status: Home / Self Care | Payer: Medicare Other | Attending: Emergency Medicine | Admitting: Emergency Medicine

## 2020-03-22 ENCOUNTER — Emergency Department: Payer: Medicare Other

## 2020-03-22 ENCOUNTER — Encounter: Payer: Self-pay | Admitting: Emergency Medicine

## 2020-03-22 ENCOUNTER — Other Ambulatory Visit: Payer: Self-pay

## 2020-03-22 DIAGNOSIS — Z7984 Long term (current) use of oral hypoglycemic drugs: Secondary | ICD-10-CM | POA: Diagnosis not present

## 2020-03-22 DIAGNOSIS — I129 Hypertensive chronic kidney disease with stage 1 through stage 4 chronic kidney disease, or unspecified chronic kidney disease: Secondary | ICD-10-CM | POA: Diagnosis not present

## 2020-03-22 DIAGNOSIS — N189 Chronic kidney disease, unspecified: Secondary | ICD-10-CM | POA: Diagnosis not present

## 2020-03-22 DIAGNOSIS — E1165 Type 2 diabetes mellitus with hyperglycemia: Secondary | ICD-10-CM | POA: Diagnosis not present

## 2020-03-22 DIAGNOSIS — Y92009 Unspecified place in unspecified non-institutional (private) residence as the place of occurrence of the external cause: Secondary | ICD-10-CM | POA: Insufficient documentation

## 2020-03-22 DIAGNOSIS — E1122 Type 2 diabetes mellitus with diabetic chronic kidney disease: Secondary | ICD-10-CM | POA: Diagnosis not present

## 2020-03-22 DIAGNOSIS — W06XXXA Fall from bed, initial encounter: Secondary | ICD-10-CM | POA: Insufficient documentation

## 2020-03-22 DIAGNOSIS — Z7982 Long term (current) use of aspirin: Secondary | ICD-10-CM | POA: Insufficient documentation

## 2020-03-22 DIAGNOSIS — E785 Hyperlipidemia, unspecified: Secondary | ICD-10-CM | POA: Diagnosis not present

## 2020-03-22 DIAGNOSIS — Z85828 Personal history of other malignant neoplasm of skin: Secondary | ICD-10-CM | POA: Insufficient documentation

## 2020-03-22 DIAGNOSIS — Z91040 Latex allergy status: Secondary | ICD-10-CM | POA: Insufficient documentation

## 2020-03-22 DIAGNOSIS — Z87891 Personal history of nicotine dependence: Secondary | ICD-10-CM | POA: Diagnosis not present

## 2020-03-22 DIAGNOSIS — R531 Weakness: Secondary | ICD-10-CM | POA: Insufficient documentation

## 2020-03-22 DIAGNOSIS — Z79899 Other long term (current) drug therapy: Secondary | ICD-10-CM | POA: Diagnosis not present

## 2020-03-22 DIAGNOSIS — E1169 Type 2 diabetes mellitus with other specified complication: Secondary | ICD-10-CM | POA: Diagnosis not present

## 2020-03-22 LAB — CK: Total CK: 301 U/L — ABNORMAL HIGH (ref 38–234)

## 2020-03-22 LAB — COMPREHENSIVE METABOLIC PANEL
ALT: 15 U/L (ref 0–44)
AST: 20 U/L (ref 15–41)
Albumin: 3.9 g/dL (ref 3.5–5.0)
Alkaline Phosphatase: 99 U/L (ref 38–126)
Anion gap: 14 (ref 5–15)
BUN: 19 mg/dL (ref 8–23)
CO2: 25 mmol/L (ref 22–32)
Calcium: 9.1 mg/dL (ref 8.9–10.3)
Chloride: 100 mmol/L (ref 98–111)
Creatinine, Ser: 1.11 mg/dL — ABNORMAL HIGH (ref 0.44–1.00)
GFR, Estimated: 53 mL/min — ABNORMAL LOW (ref >60)
Glucose, Bld: 187 mg/dL — ABNORMAL HIGH (ref 70–99)
Potassium: 3.5 mmol/L (ref 3.5–5.1)
Sodium: 139 mmol/L (ref 135–145)
Total Bilirubin: 0.8 mg/dL (ref 0.3–1.2)
Total Protein: 7.9 g/dL (ref 6.5–8.1)

## 2020-03-22 LAB — CBC
HCT: 40.4 % (ref 36.0–46.0)
Hemoglobin: 13.2 g/dL (ref 12.0–15.0)
MCH: 30.1 pg (ref 26.0–34.0)
MCHC: 32.7 g/dL (ref 30.0–36.0)
MCV: 92.2 fL (ref 80.0–100.0)
Platelets: 248 10*3/uL (ref 150–400)
RBC: 4.38 MIL/uL (ref 3.87–5.11)
RDW: 13.6 % (ref 11.5–15.5)
WBC: 6.6 10*3/uL (ref 4.0–10.5)
nRBC: 0 % (ref 0.0–0.2)

## 2020-03-22 NOTE — ED Notes (Addendum)
Pt states coming in because she got up to go to the bathroom and was unable to get back into bed. Pt states she thinks it is summer of 2008.

## 2020-03-22 NOTE — ED Triage Notes (Signed)
Pt to ED via EMS with c/o fall. See first RN Note. Pt reports she got up to go to the bathroom last night and could get back in the bed, pt reports she fell, pt reports that she was in the floor all night. Pt states her daughter found her on the floor this morning. Pt denies any complaints at this time. Pt reports had a stroke approx 7 years ago, L sided deficits from stroke.

## 2020-03-22 NOTE — ED Provider Notes (Signed)
[facility] Emergency Department Provider Note   ____________________________________________    I have reviewed the triage vital signs and the nursing notes.   HISTORY  Chief Complaint Fall     HPI Erin Hamilton is a 70 y.o. female with a history of a CVA leaving her with left-sided deficits, diabetes, hypertension who presents with complaints of weakness.  Patient reports she got up to go to the bathroom last night, made it back to her bed but did not have the strength to get up on her bed.  She sat down on the floor or perhaps fell she does not remember but did cover herself up with her blankets and slept on the floor.  Today she feels more like herself  Past Medical History:  Diagnosis Date  . Anxiety   . CVA (cerebral infarction)   . Diabetes mellitus without complication ([place])   . Hyperlipidemia   . Hypertension   . Squamous cell carcinoma of skin 07/05/2019   Upper sternum. MD SCC.  . Stroke [facility])     Patient Active Problem List   Diagnosis Date Noted  . UTI (urinary tract infection) 11/06/2017  . Hemiparesis affecting left side as late effect of cerebrovascular accident ([place]) 06/24/2016  . Bilateral carotid artery stenosis   . H/O carotid endarterectomy   . Cognitive dysfunction due to acute cerebrovascular accident (CVA) ([place]) 06/23/2016  . H/O: stroke 11/19/2015  . Health care maintenance 11/19/2015  . Diabetes mellitus without complication ([place]) 11/91/4782  . Essential hypertension, benign 10/10/2014  . Back pain with left-sided sciatica 10/10/2014  . Acute anxiety 10/09/2014  . Stroke ([place]) 10/09/2014  . Hyperlipidemia 10/09/2014  . Depression 10/09/2014  . Hypertensive CKD (chronic kidney disease) 10/09/2014  . Type 2 diabetes mellitus with hyperglycemia, without long-term current use of insulin ([place]) 09/22/2009  . ACUTE SINUSITIS, UNSPECIFIED 09/22/2009    Past Surgical History:  Procedure Laterality Date  . TUBAL  LIGATION      Prior to Admission medications   Medication Sig Start Date End Date Taking? Authorizing Provider  aspirin EC 81 MG tablet Take 81 mg by mouth daily. Patient not taking: Reported on 02/13/2020    [provider]  atorvastatin (LIPITOR) 80 MG tablet Take 80 mg by mouth daily.    [provider]  clopidogrel (PLAVIX) 75 MG tablet TAKE 1 TABLET EVERY DAY 09/20/15   [name], NP  ezetimibe (ZETIA) 10 MG tablet Take 1 tablet (10 mg total) by mouth daily. Patient not taking: Reported on 02/13/2020 06/26/16   Holley Raring, MD  gabapentin (NEURONTIN) 100 MG capsule Take 100 mg by mouth 3 (three) times daily.    [provider]  gabapentin (NEURONTIN) 400 MG capsule TAKE 1 CAPSULE (400 MG TOTAL) BY MOUTH 3 (THREE) TIMES DAILY. Patient not taking: Reported on 02/13/2020 04/29/15   [name], NP  glucose blood test strip 1 each by Other route daily. Use as instructed Patient not taking: Reported on 02/13/2020    [provider]  ketoconazole (NIZORAL) 2 % cream Once to twice daily to affected area under breasts as needed for rash Patient not taking: Reported on 02/13/2020 08/09/19   Brendolyn Patty, MD  lisinopril (PRINIVIL,ZESTRIL) 10 MG tablet TAKE 1 TABLET EVERY DAY 07/23/15   [name], NP  metFORMIN (GLUCOPHAGE) 500 MG tablet TAKE 1 TABLET TWICE DAILY BEFORE A MEAL Patient not taking: Reported on 02/13/2020 03/19/15   [name], NP  mupirocin ointment (BACTROBAN) 2 %  Place 1 application into the nose 2 (two) times daily. Patient not taking: Reported on 02/13/2020 08/09/19   Brendolyn Patty, MD  simvastatin (ZOCOR) 20 MG tablet Take 20 mg by mouth daily. Patient not taking: Reported on 02/13/2020    [provider]     Allergies Oysters [shellfish allergy], Penicillins, and Latex  Family History  Problem Relation Age of Onset  . Stroke Father   . Heart attack Sister   . Breast cancer Sister   . Cancer Son        throat  and lung  . Cancer Sister        gum    Social History Social History   Tobacco Use  . Smoking status: Former Research scientist (life sciences)  . Smokeless tobacco: Never Used  Substance Use Topics  . Alcohol use: No    Alcohol/week: 0.0 standard drinks  . Drug use: No    Review of Systems  Constitutional: No fever/chills Eyes: No visual changes.  ENT: No sore throat. Cardiovascular: Denies chest pain. Respiratory: Denies shortness of breath. Gastrointestinal: No abdominal pain.  No nausea, no vomiting.   Genitourinary: Negative for dysuria. Musculoskeletal: Negative for back pain. Skin: Negative for rash. Neurological: Negative for headaches or weakness   ____________________________________________   PHYSICAL EXAM:  VITAL SIGNS: ED Triage Vitals  Enc Vitals Group     BP 03/22/20 1507 128/76     Pulse Rate 03/22/20 1507 78     Resp 03/22/20 1507 20     Temp 03/22/20 1507 98.4 F (36.9 C)     Temp Source 03/22/20 1507 Oral     SpO2 03/22/20 1507 97 %     Weight 03/22/20 1504 64 kg (141 lb 1.5 oz)     Height 03/22/20 1504 1.626 m (5\' 4" )     Head Circumference --      Peak Flow --      Pain Score 03/22/20 1504 0     Pain Loc --      Pain Edu? --      Excl. in [place]? --     Constitutional: Alert and oriented. No acute distress. Pleasant and interactive Eyes: Conjunctivae are normal.   Mouth/Throat: Mucous membranes are moist.   Neck:  Painless ROM Cardiovascular: Normal rate, regular rhythm. Grossly normal heart sounds.  Good peripheral circulation. Respiratory: Normal respiratory effort.  No retractions. Lungs CTAB. Gastrointestinal: Soft and nontender. No distention.  No CVA tenderness. Genitourinary: deferred Musculoskeletal: No lower extremity tenderness nor edema.  Warm and well perfused Neurologic:  Normal speech and language. No new gross focal neurologic deficits are appreciated.  Unremarkable neuro exam, mild weakness in the left lower leg which is chronic Skin:  Skin is  warm, dry and intact. No rash noted. Psychiatric: Mood and affect are normal. Speech and behavior are normal.  ____________________________________________   LABS (all labs ordered are listed, but only abnormal results are displayed)  Labs Reviewed  COMPREHENSIVE METABOLIC PANEL - Abnormal; Notable for the following components:      Result Value   Glucose, Bld 187 (*)    Creatinine, Ser 1.11 (*)    GFR, Estimated 53 (*)    All other components within normal limits  CK - Abnormal; Notable for the following components:   Total CK 301 (*)    All other components within normal limits  CBC   ____________________________________________  EKG  ED ECG REPORT I, Lavonia Drafts, the attending physician, personally viewed and interpreted this ECG.  Date: 03/22/2020  Rhythm: normal sinus rhythm QRS Axis: normal Intervals: normal ST/T Wave abnormalities: normal Narrative Interpretation: Occasional PVC  ____________________________________________  RADIOLOGY  CT unremarkable ____________________________________________   PROCEDURES  Procedure(s) performed: No  Procedures   Critical Care performed: No ____________________________________________   INITIAL IMPRESSION / ASSESSMENT AND PLAN / ED COURSE  Pertinent labs & imaging results that were available during my care of the patient were reviewed by me and considered in my medical decision making (see chart for details).  Patient presents with generalized weakness as described above, does not appear to be consistent with CVA, no reports of head injury.  Given her history of CVA and generalized weakness obtain CT head which was reassuring.  Lab work demonstrates normal white blood cell count, CK only minimally elevated not consistent with rhabdo.  BMP is reassuring  Patient is feeling like her self and ambulating well.  Appropriate for discharge at this time, no indication for admission     ____________________________________________   FINAL CLINICAL IMPRESSION(S) / ED DIAGNOSES  Final diagnoses:  Generalized weakness        Note:  This document was prepared using Dragon voice recognition software and may include unintentional dictation errors.   Lavonia Drafts, MD 03/22/20 [phone]

## 2020-03-22 NOTE — Discharge Instructions (Signed)
Your CT scan was reassuring and your blood tests were unremarkable.  Please follow-up with your PCP as needed, return to the ED if symptoms worsen

## 2020-03-22 NOTE — ED Triage Notes (Signed)
Pt  Comes into the ED via EMS from home, fell out of bed last night, weakness on the left side from previous stroke, to has no complaint per EMS, daughter wanted her checked out.  170/96 98%RA 85HR

## 2020-03-27 ENCOUNTER — Other Ambulatory Visit: Payer: Self-pay

## 2020-03-27 ENCOUNTER — Encounter: Payer: Self-pay | Admitting: Emergency Medicine

## 2020-03-27 DIAGNOSIS — N390 Urinary tract infection, site not specified: Secondary | ICD-10-CM | POA: Diagnosis present

## 2020-03-27 DIAGNOSIS — T380X5A Adverse effect of glucocorticoids and synthetic analogues, initial encounter: Secondary | ICD-10-CM | POA: Diagnosis not present

## 2020-03-27 DIAGNOSIS — F05 Delirium due to known physiological condition: Secondary | ICD-10-CM | POA: Diagnosis not present

## 2020-03-27 DIAGNOSIS — Z8249 Family history of ischemic heart disease and other diseases of the circulatory system: Secondary | ICD-10-CM

## 2020-03-27 DIAGNOSIS — I69319 Unspecified symptoms and signs involving cognitive functions following cerebral infarction: Secondary | ICD-10-CM

## 2020-03-27 DIAGNOSIS — U071 COVID-19: Principal | ICD-10-CM | POA: Diagnosis present

## 2020-03-27 DIAGNOSIS — J9601 Acute respiratory failure with hypoxia: Secondary | ICD-10-CM | POA: Diagnosis present

## 2020-03-27 DIAGNOSIS — Z91013 Allergy to seafood: Secondary | ICD-10-CM

## 2020-03-27 DIAGNOSIS — Z85828 Personal history of other malignant neoplasm of skin: Secondary | ICD-10-CM

## 2020-03-27 DIAGNOSIS — Z803 Family history of malignant neoplasm of breast: Secondary | ICD-10-CM

## 2020-03-27 DIAGNOSIS — E785 Hyperlipidemia, unspecified: Secondary | ICD-10-CM | POA: Diagnosis present

## 2020-03-27 DIAGNOSIS — J159 Unspecified bacterial pneumonia: Secondary | ICD-10-CM | POA: Diagnosis present

## 2020-03-27 DIAGNOSIS — E876 Hypokalemia: Secondary | ICD-10-CM | POA: Diagnosis not present

## 2020-03-27 DIAGNOSIS — E1142 Type 2 diabetes mellitus with diabetic polyneuropathy: Secondary | ICD-10-CM | POA: Diagnosis present

## 2020-03-27 DIAGNOSIS — E1165 Type 2 diabetes mellitus with hyperglycemia: Secondary | ICD-10-CM | POA: Diagnosis not present

## 2020-03-27 DIAGNOSIS — Z7902 Long term (current) use of antithrombotics/antiplatelets: Secondary | ICD-10-CM

## 2020-03-27 DIAGNOSIS — F32A Depression, unspecified: Secondary | ICD-10-CM | POA: Diagnosis present

## 2020-03-27 DIAGNOSIS — I69351 Hemiplegia and hemiparesis following cerebral infarction affecting right dominant side: Secondary | ICD-10-CM

## 2020-03-27 DIAGNOSIS — Z79899 Other long term (current) drug therapy: Secondary | ICD-10-CM

## 2020-03-27 DIAGNOSIS — Z7982 Long term (current) use of aspirin: Secondary | ICD-10-CM

## 2020-03-27 DIAGNOSIS — J1282 Pneumonia due to coronavirus disease 2019: Secondary | ICD-10-CM | POA: Diagnosis present

## 2020-03-27 DIAGNOSIS — Z91040 Latex allergy status: Secondary | ICD-10-CM

## 2020-03-27 DIAGNOSIS — G9341 Metabolic encephalopathy: Secondary | ICD-10-CM | POA: Diagnosis not present

## 2020-03-27 DIAGNOSIS — Z823 Family history of stroke: Secondary | ICD-10-CM

## 2020-03-27 DIAGNOSIS — F0150 Vascular dementia without behavioral disturbance: Secondary | ICD-10-CM | POA: Diagnosis present

## 2020-03-27 DIAGNOSIS — R296 Repeated falls: Secondary | ICD-10-CM | POA: Diagnosis present

## 2020-03-27 DIAGNOSIS — Z880 Allergy status to penicillin: Secondary | ICD-10-CM

## 2020-03-27 DIAGNOSIS — R627 Adult failure to thrive: Secondary | ICD-10-CM | POA: Diagnosis present

## 2020-03-27 DIAGNOSIS — I10 Essential (primary) hypertension: Secondary | ICD-10-CM | POA: Diagnosis present

## 2020-03-27 DIAGNOSIS — Z87891 Personal history of nicotine dependence: Secondary | ICD-10-CM

## 2020-03-27 LAB — URINALYSIS, COMPLETE (UACMP) WITH MICROSCOPIC
Bacteria, UA: NONE SEEN
Bilirubin Urine: NEGATIVE
Glucose, UA: 150 mg/dL — AB
Ketones, ur: 5 mg/dL — AB
Nitrite: NEGATIVE
Protein, ur: 100 mg/dL — AB
Specific Gravity, Urine: 1.029 (ref 1.005–1.030)
pH: 5 (ref 5.0–8.0)

## 2020-03-27 LAB — BASIC METABOLIC PANEL
Anion gap: 13 (ref 5–15)
BUN: 18 mg/dL (ref 8–23)
CO2: 25 mmol/L (ref 22–32)
Calcium: 8.6 mg/dL — ABNORMAL LOW (ref 8.9–10.3)
Chloride: 99 mmol/L (ref 98–111)
Creatinine, Ser: 0.98 mg/dL (ref 0.44–1.00)
GFR, Estimated: >60 mL/min (ref >60)
Glucose, Bld: 214 mg/dL — ABNORMAL HIGH (ref 70–99)
Potassium: 3.4 mmol/L — ABNORMAL LOW (ref 3.5–5.1)
Sodium: 137 mmol/L (ref 135–145)

## 2020-03-27 LAB — CBC
HCT: 41.9 % (ref 36.0–46.0)
Hemoglobin: 13.7 g/dL (ref 12.0–15.0)
MCH: 30.2 pg (ref 26.0–34.0)
MCHC: 32.7 g/dL (ref 30.0–36.0)
MCV: 92.3 fL (ref 80.0–100.0)
Platelets: 203 10*3/uL (ref 150–400)
RBC: 4.54 MIL/uL (ref 3.87–5.11)
RDW: 13.6 % (ref 11.5–15.5)
WBC: 4.2 10*3/uL (ref 4.0–10.5)
nRBC: 0 % (ref 0.0–0.2)

## 2020-03-27 NOTE — ED Triage Notes (Signed)
Pt denies any complaints since the falls, when asked if she falling more than normal pt states, "apparently I am". This RN notified by Malachy Mood, Care Manager that patient's daughter asking about placement. Pt denies any complaints/injuries since the fall.

## 2020-03-27 NOTE — ED Triage Notes (Signed)
Pt to ED Via [facility] with c/o increasing weakness, increasing falls, EMS reports poor skin turgor. Per EMS pt also c/o N/V however EMS reports no N/V with them.    144/68 16R 94% RA CBG 292

## 2020-03-28 ENCOUNTER — Inpatient Hospital Stay
Admission: EM | Admit: 2020-03-28 | Discharge: 2020-04-09 | DRG: 177 | Disposition: A | Discharge status: Skilled Nursing Facility | Payer: Medicare Other | Attending: Family Medicine | Admitting: Family Medicine

## 2020-03-28 ENCOUNTER — Emergency Department: Payer: Medicare Other

## 2020-03-28 DIAGNOSIS — E1165 Type 2 diabetes mellitus with hyperglycemia: Secondary | ICD-10-CM | POA: Diagnosis present

## 2020-03-28 DIAGNOSIS — E785 Hyperlipidemia, unspecified: Secondary | ICD-10-CM | POA: Diagnosis present

## 2020-03-28 DIAGNOSIS — I639 Cerebral infarction, unspecified: Secondary | ICD-10-CM | POA: Diagnosis present

## 2020-03-28 DIAGNOSIS — R5381 Other malaise: Secondary | ICD-10-CM

## 2020-03-28 DIAGNOSIS — I69359 Hemiplegia and hemiparesis following cerebral infarction affecting unspecified side: Secondary | ICD-10-CM

## 2020-03-28 DIAGNOSIS — I10 Essential (primary) hypertension: Secondary | ICD-10-CM | POA: Diagnosis not present

## 2020-03-28 DIAGNOSIS — J1282 Pneumonia due to coronavirus disease 2019: Secondary | ICD-10-CM | POA: Diagnosis present

## 2020-03-28 DIAGNOSIS — F32A Depression, unspecified: Secondary | ICD-10-CM | POA: Diagnosis present

## 2020-03-28 DIAGNOSIS — U071 COVID-19: Secondary | ICD-10-CM | POA: Diagnosis present

## 2020-03-28 DIAGNOSIS — N390 Urinary tract infection, site not specified: Secondary | ICD-10-CM

## 2020-03-28 DIAGNOSIS — J9601 Acute respiratory failure with hypoxia: Secondary | ICD-10-CM

## 2020-03-28 DIAGNOSIS — A419 Sepsis, unspecified organism: Secondary | ICD-10-CM

## 2020-03-28 DIAGNOSIS — R4189 Other symptoms and signs involving cognitive functions and awareness: Secondary | ICD-10-CM | POA: Diagnosis present

## 2020-03-28 DIAGNOSIS — R7881 Bacteremia: Secondary | ICD-10-CM | POA: Diagnosis present

## 2020-03-28 DIAGNOSIS — I69354 Hemiplegia and hemiparesis following cerebral infarction affecting left non-dominant side: Secondary | ICD-10-CM

## 2020-03-28 DIAGNOSIS — E119 Type 2 diabetes mellitus without complications: Secondary | ICD-10-CM | POA: Diagnosis not present

## 2020-03-28 DIAGNOSIS — Z8673 Personal history of transient ischemic attack (TIA), and cerebral infarction without residual deficits: Secondary | ICD-10-CM

## 2020-03-28 DIAGNOSIS — Z9889 Other specified postprocedural states: Secondary | ICD-10-CM

## 2020-03-28 DIAGNOSIS — G9341 Metabolic encephalopathy: Secondary | ICD-10-CM | POA: Diagnosis not present

## 2020-03-28 DIAGNOSIS — R531 Weakness: Secondary | ICD-10-CM

## 2020-03-28 LAB — FIBRINOGEN: Fibrinogen: 461 mg/dL (ref 210–475)

## 2020-03-28 LAB — LACTIC ACID, PLASMA
Lactic Acid, Venous: 1.3 mmol/L (ref 0.5–1.9)
Lactic Acid, Venous: 1.4 mmol/L (ref 0.5–1.9)

## 2020-03-28 LAB — PROTIME-INR
INR: 1 (ref 0.8–1.2)
Prothrombin Time: 12.7 seconds (ref 11.4–15.2)

## 2020-03-28 LAB — CBC WITH DIFFERENTIAL/PLATELET
Abs Immature Granulocytes: 0.03 10*3/uL (ref 0.00–0.07)
Basophils Absolute: 0 10*3/uL (ref 0.0–0.1)
Basophils Relative: 0 %
Eosinophils Absolute: 0 10*3/uL (ref 0.0–0.5)
Eosinophils Relative: 0 %
HCT: 41.8 % (ref 36.0–46.0)
Hemoglobin: 13.7 g/dL (ref 12.0–15.0)
Immature Granulocytes: 1 %
Lymphocytes Relative: 29 %
Lymphs Abs: 1.4 10*3/uL (ref 0.7–4.0)
MCH: 30.4 pg (ref 26.0–34.0)
MCHC: 32.8 g/dL (ref 30.0–36.0)
MCV: 92.7 fL (ref 80.0–100.0)
Monocytes Absolute: 0.4 10*3/uL (ref 0.1–1.0)
Monocytes Relative: 8 %
Neutro Abs: 3 10*3/uL (ref 1.7–7.7)
Neutrophils Relative %: 62 %
Platelets: 186 10*3/uL (ref 150–400)
RBC: 4.51 MIL/uL (ref 3.87–5.11)
RDW: 13.6 % (ref 11.5–15.5)
WBC: 4.8 10*3/uL (ref 4.0–10.5)
nRBC: 0 % (ref 0.0–0.2)

## 2020-03-28 LAB — FIBRIN DERIVATIVES D-DIMER (ARMC ONLY): Fibrin derivatives D-dimer (ARMC): 2089.5 ng/mL (FEU) — ABNORMAL HIGH (ref 0.00–499.00)

## 2020-03-28 LAB — PROCALCITONIN: Procalcitonin: <0.1 ng/mL

## 2020-03-28 LAB — TRIGLYCERIDES: Triglycerides: 116 mg/dL (ref <150)

## 2020-03-28 LAB — GLUCOSE, CAPILLARY: Glucose-Capillary: 179 mg/dL — ABNORMAL HIGH (ref 70–99)

## 2020-03-28 LAB — APTT: aPTT: 30 seconds (ref 24–36)

## 2020-03-28 LAB — LACTATE DEHYDROGENASE: LDH: 214 U/L — ABNORMAL HIGH (ref 98–192)

## 2020-03-28 LAB — POC SARS CORONAVIRUS 2 AG -  ED: SARS Coronavirus 2 Ag: POSITIVE — AB

## 2020-03-28 LAB — C-REACTIVE PROTEIN: CRP: <0.5 mg/dL (ref <1.0)

## 2020-03-28 LAB — FERRITIN: Ferritin: 84 ng/mL (ref 11–307)

## 2020-03-28 LAB — SARS CORONAVIRUS 2 (TAT 6-24 HRS): SARS Coronavirus 2: POSITIVE — AB

## 2020-03-28 MED ORDER — NITROFURANTOIN MONOHYD MACRO 100 MG PO CAPS
100.0000 mg | ORAL_CAPSULE | Freq: Once | ORAL | Status: AC
  Administered 2020-03-28: 100 mg via ORAL
  Filled 2020-03-28: qty 1

## 2020-03-28 MED ORDER — GABAPENTIN 100 MG PO CAPS
100.0000 mg | ORAL_CAPSULE | Freq: Three times a day (TID) | ORAL | Status: DC
Start: 2020-03-28 — End: 2020-04-09
  Administered 2020-03-28 – 2020-04-09 (×33): 100 mg via ORAL
  Filled 2020-03-28 (×35): qty 1

## 2020-03-28 MED ORDER — ACETAMINOPHEN 325 MG PO TABS
325.0000 mg | ORAL_TABLET | Freq: Four times a day (QID) | ORAL | Status: DC | PRN
  Administered 2020-03-28 – 2020-04-04 (×4): 325 mg via ORAL
  Filled 2020-03-28 (×4): qty 1

## 2020-03-28 MED ORDER — ONDANSETRON HCL 4 MG PO TABS: 4.0000 mg | ORAL_TABLET | Freq: Four times a day (QID) | ORAL | Status: DC | PRN

## 2020-03-28 MED ORDER — ENOXAPARIN SODIUM 40 MG/0.4ML ~~LOC~~ SOLN
40.0000 mg | SUBCUTANEOUS | Status: DC
  Administered 2020-03-28 – 2020-04-08 (×12): 40 mg via SUBCUTANEOUS
  Filled 2020-03-28 (×13): qty 0.4

## 2020-03-28 MED ORDER — PREDNISONE 50 MG PO TABS: 50.0000 mg | ORAL_TABLET | Freq: Every day | ORAL | Status: DC

## 2020-03-28 MED ORDER — SODIUM CHLORIDE 0.9 % IV SOLN
200.0000 mg | Freq: Once | INTRAVENOUS | Status: AC
  Administered 2020-03-28: 200 mg via INTRAVENOUS
  Filled 2020-03-28: qty 200

## 2020-03-28 MED ORDER — ASPIRIN EC 81 MG PO TBEC
81.0000 mg | DELAYED_RELEASE_TABLET | Freq: Every day | ORAL | Status: DC
  Administered 2020-03-28 – 2020-04-08 (×11): 81 mg via ORAL
  Filled 2020-03-28 (×12): qty 1

## 2020-03-28 MED ORDER — ATORVASTATIN CALCIUM 20 MG PO TABS
80.0000 mg | ORAL_TABLET | Freq: Every day | ORAL | Status: DC
  Administered 2020-03-28 – 2020-04-09 (×12): 80 mg via ORAL
  Filled 2020-03-28 (×13): qty 4

## 2020-03-28 MED ORDER — LISINOPRIL 10 MG PO TABS
10.0000 mg | ORAL_TABLET | Freq: Every day | ORAL | Status: DC
  Administered 2020-03-28 – 2020-04-09 (×10): 10 mg via ORAL
  Filled 2020-03-28 (×13): qty 1

## 2020-03-28 MED ORDER — METHYLPREDNISOLONE SODIUM SUCC 40 MG IJ SOLR
0.5000 mg/kg | Freq: Two times a day (BID) | INTRAMUSCULAR | Status: DC
  Administered 2020-03-28 – 2020-03-30 (×4): 32 mg via INTRAVENOUS
  Filled 2020-03-28 (×4): qty 1

## 2020-03-28 MED ORDER — EZETIMIBE 10 MG PO TABS
10.0000 mg | ORAL_TABLET | Freq: Every day | ORAL | Status: DC
  Administered 2020-03-28 – 2020-04-09 (×10): 10 mg via ORAL
  Filled 2020-03-28 (×14): qty 1

## 2020-03-28 MED ORDER — LACTATED RINGERS IV SOLN: INTRAVENOUS | Status: AC

## 2020-03-28 MED ORDER — METFORMIN HCL 500 MG PO TABS
500.0000 mg | ORAL_TABLET | Freq: Every day | ORAL | Status: DC
  Administered 2020-03-28 – 2020-03-29 (×2): 500 mg via ORAL
  Filled 2020-03-28 (×3): qty 1

## 2020-03-28 MED ORDER — CLOPIDOGREL BISULFATE 75 MG PO TABS
75.0000 mg | ORAL_TABLET | Freq: Every day | ORAL | Status: DC
  Administered 2020-03-28 – 2020-04-09 (×12): 75 mg via ORAL
  Filled 2020-03-28 (×13): qty 1

## 2020-03-28 MED ORDER — SODIUM CHLORIDE 0.9 % IV SOLN: 200.0000 mg | Freq: Once | INTRAVENOUS | Status: DC

## 2020-03-28 MED ORDER — ONDANSETRON HCL 4 MG/2ML IJ SOLN: 4.0000 mg | Freq: Four times a day (QID) | INTRAMUSCULAR | Status: DC | PRN

## 2020-03-28 MED ORDER — NITROFURANTOIN MONOHYD MACRO 100 MG PO CAPS
100.0000 mg | ORAL_CAPSULE | Freq: Two times a day (BID) | ORAL | Status: DC
  Administered 2020-03-28 – 2020-03-29 (×3): 100 mg via ORAL
  Filled 2020-03-28 (×4): qty 1

## 2020-03-28 MED ORDER — SODIUM CHLORIDE 0.9 % IV SOLN: 100.0000 mg | Freq: Every day | INTRAVENOUS | Status: DC

## 2020-03-28 MED ORDER — SODIUM CHLORIDE 0.9 % IV SOLN
100.0000 mg | Freq: Every day | INTRAVENOUS | Status: AC
  Administered 2020-03-29 – 2020-04-01 (×4): 100 mg via INTRAVENOUS
  Filled 2020-03-28 (×4): qty 20

## 2020-03-28 MED ORDER — LEVOFLOXACIN IN D5W 750 MG/150ML IV SOLN
750.0000 mg | Freq: Once | INTRAVENOUS | Status: AC
  Administered 2020-03-28: 750 mg via INTRAVENOUS
  Filled 2020-03-28: qty 150

## 2020-03-28 MED ORDER — DEXAMETHASONE SODIUM PHOSPHATE 10 MG/ML IJ SOLN
6.0000 mg | Freq: Once | INTRAMUSCULAR | Status: AC
  Administered 2020-03-28: 6 mg via INTRAVENOUS
  Filled 2020-03-28: qty 1

## 2020-03-28 NOTE — ED Notes (Signed)
EDP notified of pt's new O2 requirement and temperature

## 2020-03-28 NOTE — ED Notes (Signed)
Pt to CT

## 2020-03-28 NOTE — ED Notes (Signed)
Pt resting comfortably with eyes closed, will continue to monitor.  

## 2020-03-28 NOTE — Progress Notes (Signed)
PT Cancellation Note  Patient Details Name: Erin Hamilton MRN: [id number] DOB: 1950/06/15   Cancelled Treatment:    Reason Eval/Treat Not Completed: Patient not medically ready: Per conversation with nursing pt's SpO2 trending down with pt's temp now >100 and with patient lethargic.  Will hold PT eval at this time and will attempt to see pt at a future date/time as medically appropriate.     Heloise Beecham Avenly Roberge PT, DPT 03/28/20, 1:50 PM

## 2020-03-28 NOTE — Consult Note (Signed)
PHARMACY -  BRIEF ANTIBIOTIC NOTE   Pharmacy has received consult(s) for Levaquin from an ED provider.  The patient's profile has been reviewed for ht/wt/allergies/indication/available labs.    One time order(s) placed for Levaquin 750mg  IV x 1  Further antibiotics/pharmacy consults should be ordered by admitting physician if indicated.                       Thank you,  Lu Duffel, PharmD, BCPS Clinical Pharmacist 03/28/2020 2:26 PM

## 2020-03-28 NOTE — ED Notes (Signed)
Peri care done, clean depends in place.

## 2020-03-28 NOTE — ED Notes (Signed)
Pt accidentally pulled out 1 IV while moving in bed. Pt incontinent of urine, brief and bedding changed

## 2020-03-28 NOTE — Consult Note (Signed)
Remdesivir - Pharmacy Brief Note   O:  ALT: 20 CXR: "may reflect COVID PNA" SpO2: 95% on 2L   A/P:  Remdesivir 200 mg IVPB once followed by 100 mg IVPB daily x 4 days.   Lu Duffel, PharmD, BCPS Clinical Pharmacist 03/28/2020 3:15 PM

## 2020-03-28 NOTE — ED Notes (Signed)
Patient transported to X-ray 

## 2020-03-28 NOTE — ED Notes (Signed)
Pt noted at 88% on room air, pt elevated in bed, sats remain 88-89% on room air, O2 [place] applied at 2 L, sats improved to 95% on 2 L [place]

## 2020-03-28 NOTE — ED Provider Notes (Signed)
[facility] Emergency Department Provider Note ____________________________________________   Event Date/Time   First MD Initiated Contact with Patient 03/28/20 0225     (approximate)  I have reviewed the triage vital signs and the nursing notes.  HISTORY  Chief Complaint Fall and Weakness   HPI Erin Hamilton is a 70 y.o. femalewho presents to the ED for evaluation of falls and worsening generalized weakness.  Chart review indicates HTN, HLD, stroke on plavix with residual left-sided hemiparesis.  Patient presents to the ED via EMS from home with concerns for increasing generalized weakness and falls at home.  Patient is disoriented and unable to provide any relevant history.  She denies falls to me and cannot tell me why she is here.  Attempted to call the daughter, but no response.  Apparently daughter wants her placed in a facility, per nursing triage notes.  History is limited due to this.  Past Medical History:  Diagnosis Date  . Anxiety   . CVA (cerebral infarction)   . Diabetes mellitus without complication ([place])   . Hyperlipidemia   . Hypertension   . Squamous cell carcinoma of skin 07/05/2019   Upper sternum. MD SCC.  . Stroke [facility])     Patient Active Problem List   Diagnosis Date Noted  . UTI (urinary tract infection) 11/06/2017  . Hemiparesis affecting left side as late effect of cerebrovascular accident ([place]) 06/24/2016  . Bilateral carotid artery stenosis   . H/O carotid endarterectomy   . Cognitive dysfunction due to acute cerebrovascular accident (CVA) ([place]) 06/23/2016  . H/O: stroke 11/19/2015  . Health care maintenance 11/19/2015  . Diabetes mellitus without complication ([place]) [id number]  . Essential hypertension, benign 10/10/2014  . Back pain with left-sided sciatica 10/10/2014  . Acute anxiety 10/09/2014  . Stroke ([place]) 10/09/2014  . Hyperlipidemia 10/09/2014  . Depression 10/09/2014  . Hypertensive CKD (chronic  kidney disease) 10/09/2014  . Type 2 diabetes mellitus with hyperglycemia, without long-term current use of insulin ([place]) 09/22/2009  . ACUTE SINUSITIS, UNSPECIFIED 09/22/2009    Past Surgical History:  Procedure Laterality Date  . TUBAL LIGATION      Prior to Admission medications   Medication Sig Start Date End Date Taking? Authorizing Provider  aspirin EC 81 MG tablet Take 81 mg by mouth daily. Patient not taking: Reported on 02/13/2020    [provider]  atorvastatin (LIPITOR) 80 MG tablet Take 80 mg by mouth daily.    [provider]  clopidogrel (PLAVIX) 75 MG tablet TAKE 1 TABLET EVERY DAY 09/20/15   [name], NP  ezetimibe (ZETIA) 10 MG tablet Take 1 tablet (10 mg total) by mouth daily. Patient not taking: Reported on 02/13/2020 06/26/16   Holley Raring, MD  gabapentin (NEURONTIN) 100 MG capsule Take 100 mg by mouth 3 (three) times daily.    [provider]  gabapentin (NEURONTIN) 400 MG capsule TAKE 1 CAPSULE (400 MG TOTAL) BY MOUTH 3 (THREE) TIMES DAILY. Patient not taking: Reported on 02/13/2020 04/29/15   [name], NP  glucose blood test strip 1 each by Other route daily. Use as instructed Patient not taking: Reported on 02/13/2020    [provider]  ketoconazole (NIZORAL) 2 % cream Once to twice daily to affected area under breasts as needed for rash Patient not taking: Reported on 02/13/2020 08/09/19   Brendolyn Patty, MD  lisinopril (PRINIVIL,ZESTRIL) 10 MG tablet TAKE 1 TABLET EVERY DAY 07/23/15   [name], NP  metFORMIN (GLUCOPHAGE) 500  MG tablet TAKE 1 TABLET TWICE DAILY BEFORE A MEAL Patient not taking: Reported on 02/13/2020 03/19/15   [name], NP  mupirocin ointment (BACTROBAN) 2 % Place 1 application into the nose 2 (two) times daily. Patient not taking: Reported on 02/13/2020 08/09/19   Brendolyn Patty, MD  simvastatin (ZOCOR) 20 MG tablet Take 20 mg by mouth daily. Patient not taking: Reported on 02/13/2020     [provider]    Allergies Oysters [shellfish allergy], Penicillins, and Latex  Family History  Problem Relation Age of Onset  . Stroke Father   . Heart attack Sister   . Breast cancer Sister   . Cancer Son        throat and lung  . Cancer Sister        gum    Social History Social History   Tobacco Use  . Smoking status: Former Research scientist (life sciences)  . Smokeless tobacco: Never Used  Substance Use Topics  . Alcohol use: No    Alcohol/week: 0.0 standard drinks  . Drug use: No    Review of Systems  Unable to be accurately assessed due to patient's disorientation.  ____________________________________________   PHYSICAL EXAM:  VITAL SIGNS: Vitals:   03/28/20 0056 03/28/20 0427  BP: (!) 156/100 (!) 144/82  Pulse: 90 82  Resp: 18 20  Temp:    SpO2: 97% 92%     Constitutional: Alert and pleasantly disoriented. Well appearing and in no acute distress.  Slightly hard of hearing. Eyes: Conjunctivae are normal. PERRL. EOMI. Head: Atraumatic. Nose: No congestion/rhinnorhea. Mouth/Throat: Mucous membranes are moist.  Oropharynx non-erythematous. Neck: No stridor. No cervical spine tenderness to palpation. Cardiovascular: Normal rate, regular rhythm. Grossly normal heart sounds.  Good peripheral circulation. Respiratory: Normal respiratory effort.  No retractions. Lungs CTAB. Gastrointestinal: Soft , nondistended, nontender to palpation. No CVA tenderness. Musculoskeletal: No lower extremity tenderness nor edema.  No joint effusions. No signs of acute trauma. Palpation of all 4 extremities with no evidence of deformity or acute trauma. Neurologic:  Normal speech and language.  Baseline left-sided hemiparesis without evidence of further acute deficits.  Follows commands. Skin:  Skin is warm, dry and intact. No rash noted. Psychiatric: Mood and affect are normal. Speech and behavior are normal.  ____________________________________________   LABS (all labs ordered are  listed, but only abnormal results are displayed)  Labs Reviewed  BASIC METABOLIC PANEL - Abnormal; Notable for the following components:      Result Value   Potassium 3.4 (*)    Glucose, Bld 214 (*)    Calcium 8.6 (*)    All other components within normal limits  URINALYSIS, COMPLETE (UACMP) WITH MICROSCOPIC - Abnormal; Notable for the following components:   Color, Urine AMBER (*)    APPearance CLOUDY (*)    Glucose, UA 150 (*)    Hgb urine dipstick MODERATE (*)    Ketones, ur 5 (*)    Protein, ur 100 (*)    Leukocytes,Ua TRACE (*)    All other components within normal limits  URINE CULTURE  SARS CORONAVIRUS 2 (TAT 6-24 HRS)  CBC   ____________________________________________  12 Lead EKG  Sinus rhythm, rate of 88 bpm.  Normal axis.  Normal intervals.  Lateral and inferior T wave inversions without STEMI criteria. ____________________________________________  RADIOLOGY  ED MD interpretation: CT head reviewed by me without evidence of acute ICH  Official radiology report(s): CT Head Wo Contrast  Result Date: 03/28/2020 CLINICAL DATA:  Chronic antiplatelet therapy, multiple falls, head  injury, remote CVA EXAM: CT HEAD WITHOUT CONTRAST TECHNIQUE: Contiguous axial images were obtained from the base of the skull through the vertex without intravenous contrast. COMPARISON:  03/22/2020 FINDINGS: Brain: Large right remote MCA territory infarct again noted. Remote lacunar infarcts noted within the a left insular cortex and left putamen. Moderate parenchymal volume loss is commensurate with the patient's age. Moderate periventricular white matter changes are present likely reflecting the sequela of small vessel ischemia. No evidence of acute intracranial hemorrhage or infarct. No abnormal mass effect or midline shift. No abnormal intra or extra-axial mass lesion or fluid collection. Ventricular size is normal. Cerebellum is unremarkable. Vascular: No asymmetric hyperdense vasculature noted  at the skull base. Skull: Intact Sinuses/Orbits: Paranasal sinuses are clear. The orbits are unremarkable. Other: Mastoid air cells and middle ear cavities are clear. IMPRESSION: Stable remote infarcts including large right MCA territory infarct. No evidence of acute intracranial hemorrhage or infarct. No calvarial fracture. Electronically Signed   By: Fidela Salisbury MD   On: 03/28/2020 03:33    ____________________________________________   PROCEDURES and INTERVENTIONS  Procedure(s) performed (including Critical Care):  .1-3 Lead EKG Interpretation Performed by: Vladimir Crofts, MD Authorized by: Vladimir Crofts, MD     Interpretation: normal     ECG rate:  80   ECG rate assessment: normal     Rhythm: sinus rhythm     Ectopy: none     Conduction: normal      Medications  nitrofurantoin (macrocrystal-monohydrate) (MACROBID) capsule 100 mg (100 mg Oral Given 03/28/20 0255)    ____________________________________________   MDM / ED COURSE   70 year old woman with history of stroke presents to the ED from home via EMS with reported worsening generalized weakness and falls, with evidence of possible acute cystitis, and requiring SNF placement.  Normal vitals on room air.  Exam with chronic left-sided hemiparesis from her previous stroke, but no distress or evidence of new neurovascular deficits.  Benign abdomen.  Urine with possible UTI, so we will treat with a course of Macrobid.  CT head obtained due to her being on aspirin and Plavix with recurrent falls, no evidence of ICH or acute CVA.  Blood work largely unremarkable.  I attempted to call her daughter, [name], but unable to get in touch with her.  We will hold the patient in the ED and have social work assistance in the morning for possible social placement.  Home meds ordered.   Clinical Course as of 03/28/20 0437  Thu Mar 28, 2020  [id number] Called daughter, [name], home and cell number.  No response so I left a HIPAA compliant voicemail  [DS]    Clinical Course User Index [DS] Vladimir Crofts, MD    ____________________________________________   FINAL CLINICAL IMPRESSION(S) / ED DIAGNOSES  Final diagnoses:  Generalized weakness  Lower urinary tract infectious disease  CVA, old, hemiparesis [facility])     ED Discharge Orders    None       Patty Lopezgarcia Tamala Julian   Note:  This document was prepared using Dragon voice recognition software and may include unintentional dictation errors.   Vladimir Crofts, MD 03/28/20 [phone]

## 2020-03-28 NOTE — ED Provider Notes (Signed)
Patient received in sign-out from Dr. Tamala Julian.  Workup and evaluation pending social work evaluation.  However while observing here in the ER patient has mounted a fever and is found to be hypoxic requiring 2 L nasal cannula.  Patient is Covid positive.  Will require hospitalization as she is requiring supplemental oxygen.Merlyn Lot, MD 03/28/20 [phone]

## 2020-03-28 NOTE — H&P (Addendum)
History and Physical   Erin Hamilton [id number] DOB: 25-Jan-1951 DOA: 03/28/2020  PCP: Kirk Ruths, MD Outpatient Specialists: Dr. Manuella Ghazi for neurologist for stroke  Patient coming from: Home  I have personally briefly reviewed patient's old medical records in [place].  Chief Concern: Weakness  HPI: Erin Hamilton is a 70 y.o. female with medical history significant for dementia, failure to thrive, prior history of stroke in September 2019, hyperlipidemia, diabetes mellitus, hypertension, presented to EMS from home for chief concerns of weakness and frequent falling.  EMS was called by daughter due to patient having difficulty walking for 5 days ago.   Per daughter patient gets around by herself. She was brought in the ED on 03/27/2020.  Daughter denies, nausea, vomiting, fever at home.  Vaccinations: she not vaccinated.   Social history: lived by herself. At baseline she is able to perform her own.  ROS: Unable to perform as patient has advanced dementia in setting of old left MCA infarct  ED Course: Discussed with ED provider, patient requiring hospitalization due to COVID-19 infection with hypoxia.  Patient desatted to 86-89, percent on room air, Covid was positive.  Patient was placed on nasal cannula 2 L with appropriate improvement.  Assessment/Plan  Active Problems:   Acute hypoxemic respiratory failure due to COVID-19 [facility])   Acute hypoxemic respiratory failure secondary to COVID-19 infection - IV remdesivir per pharmacy, IV solumedrol 1 g/kg IV q12h initiated  - Superimposed bacterial infection coverage with ceftriaxone 1 g daily IV and azithromycin 500 mg IV daily started - Incentive spirometry and flutter valve for 10 reps every 2 hours while awake - Albuterol inhaler 2 puffs every 4 hours while awake, budesonide 2 puffs inhaler twice daily - Daily labs: CMP, CBC, CRP, D-dimer - Supplemental oxygen to maintain SPO2 goal of greater than 88% -  Airborne and contact precautions  Debility and generalized weakness-patient was initially being monitored in the emergency department for pending placement due to generalized weakness and no one at home able to care for -Transitional care management place by ED provider for SNF placement versus long-term placement  Hypertension-resumed lisinopril 10 mg daily  Non-insulin-dependent diabetes mellitus-resume Metformin 500 mg p.o. daily  Hyperlipidemia-resumed home atorvastatin 80 mg p.o. daily  History of stroke-resumed home Plavix 75 mg daily, atorvastatin 80 mg daily, aspirin 81 mg daily  Neuropathy-resumed gabapentin 100 mg p.o. 3 times daily  Chart reviewed.   DVT prophylaxis: Enoxaparin 40 mg subcutaneous daily Code Status: Limited, no chest compressions Diet: Heart healthy Family Communication: Richardine Easterwood  Disposition Plan: Pending placement Consults called: None at this time Admission status: Observation  Past Medical History:  Diagnosis Date  . Anxiety   . CVA (cerebral infarction)   . Diabetes mellitus without complication ([place])   . Hyperlipidemia   . Hypertension   . Squamous cell carcinoma of skin 07/05/2019   Upper sternum. MD [facility].  . Stroke [facility])    Past Surgical History:  Procedure Laterality Date  . TUBAL LIGATION     Social History:  reports that she has quit smoking. She has never used smokeless tobacco. She reports that she does not drink alcohol and does not use drugs.  Allergies  Allergen Reactions  . Oysters [Shellfish Allergy] Anaphylaxis  . Penicillins Anaphylaxis    Has patient had a PCN reaction causing immediate rash, facial/tongue/throat swelling, SOB or lightheadedness with hypotension: Yes Has patient had a PCN reaction causing severe rash involving mucus membranes or skin necrosis: No  Has patient had a PCN reaction that required hospitalization No Has patient had a PCN reaction occurring within the last 10 years: No If all of the above  answers are "NO", then may proceed with Cephalosporin use.   . Latex Rash   Family History  Problem Relation Age of Onset  . Stroke Father   . Heart attack Sister   . Breast cancer Sister   . Cancer Son        throat and lung  . Cancer Sister        gum   Family history: Family history reviewed and not pertinent  Prior to Admission medications   Medication Sig Start Date End Date Taking? Authorizing Provider  aspirin EC 81 MG tablet Take 81 mg by mouth daily. Patient not taking: Reported on 02/13/2020    [provider]  atorvastatin (LIPITOR) 80 MG tablet Take 80 mg by mouth daily.    [provider]  clopidogrel (PLAVIX) 75 MG tablet TAKE 1 TABLET EVERY DAY 09/20/15   [name], NP  ezetimibe (ZETIA) 10 MG tablet Take 1 tablet (10 mg total) by mouth daily. Patient not taking: Reported on 02/13/2020 06/26/16   Holley Raring, MD  gabapentin (NEURONTIN) 100 MG capsule Take 100 mg by mouth 3 (three) times daily.    [provider]  gabapentin (NEURONTIN) 400 MG capsule TAKE 1 CAPSULE (400 MG TOTAL) BY MOUTH 3 (THREE) TIMES DAILY. Patient not taking: Reported on 02/13/2020 04/29/15   [name], NP  glucose blood test strip 1 each by Other route daily. Use as instructed Patient not taking: Reported on 02/13/2020    [provider]  ketoconazole (NIZORAL) 2 % cream Once to twice daily to affected area under breasts as needed for rash Patient not taking: Reported on 02/13/2020 08/09/19   Brendolyn Patty, MD  lisinopril (PRINIVIL,ZESTRIL) 10 MG tablet TAKE 1 TABLET EVERY DAY 07/23/15   [name], NP  metFORMIN (GLUCOPHAGE) 500 MG tablet TAKE 1 TABLET TWICE DAILY BEFORE A MEAL Patient not taking: Reported on 02/13/2020 03/19/15   [name], NP  mupirocin ointment (BACTROBAN) 2 % Place 1 application into the nose 2 (two) times daily. Patient not taking: Reported on 02/13/2020 08/09/19   Brendolyn Patty, MD  simvastatin (ZOCOR) 20 MG tablet Take  20 mg by mouth daily. Patient not taking: Reported on 02/13/2020    [provider]   Physical Exam: Vitals:   03/28/20 1336 03/28/20 1350 03/28/20 1353 03/28/20 1425  BP:      Pulse:      Resp:      Temp: (!) 100.4 F (38 C)   98.7 F (37.1 C)  TempSrc: Axillary   Oral  SpO2:  (!) 89% 95%   Weight:      Height:       Constitutional: appears age-appropriate, NAD, calm, comfortable Eyes: PERRL, lids and conjunctivae normal ENMT: Mucous membranes are moist. Posterior pharynx clear of any exudate or lesions. Age-appropriate dentition. Hearing appropriate Neck: normal, supple, no masses, no thyromegaly Respiratory: clear to auscultation bilaterally, no wheezing, no crackles. Normal respiratory effort. No accessory muscle use.  Cardiovascular: Regular rate and rhythm, no murmurs / rubs / gallops. No extremity edema. 2+ pedal pulses. No carotid bruits.  Abdomen: Obese abdomen, no tenderness, no masses palpated, no hepatosplenomegaly. Bowel sounds positive.  Musculoskeletal: no clubbing / cyanosis. No joint deformity upper and lower extremities. Good ROM, no contractures, no atrophy. Normal muscle tone.  Skin: no rashes, lesions, ulcers.  No induration Neurologic: Sensation intact. Alert and oriented x self and location of hospital.   Psychiatric: Normal judgment and insight. She was not able to tell me her age with her current year.  Normal mood.   EKG: independently reviewed, showing sinus rhythm of rate 88 QTc 406  Chest x-ray on Admission: I personally reviewed and I agree with radiologist reading as below.  DG Chest 1 View  Result Date: 03/28/2020 CLINICAL DATA:  Possible sepsis, COVID positive EXAM: CHEST  1 VIEW COMPARISON:  2015 FINDINGS: Patchy interstitial opacities at the lung bases. No pleural effusion. No pneumothorax. Cardiomediastinal contours are within normal limits for technique. IMPRESSION: Patchy interstitial opacities at the lung bases, which may reflect COVID  pneumonia. Electronically Signed   By: Macy Mis M.D.   On: 03/28/2020 15:00   CT Head Wo Contrast  Result Date: 03/28/2020 CLINICAL DATA:  Chronic antiplatelet therapy, multiple falls, head injury, remote CVA EXAM: CT HEAD WITHOUT CONTRAST TECHNIQUE: Contiguous axial images were obtained from the base of the skull through the vertex without intravenous contrast. COMPARISON:  03/22/2020 FINDINGS: Brain: Large right remote MCA territory infarct again noted. Remote lacunar infarcts noted within the a left insular cortex and left putamen. Moderate parenchymal volume loss is commensurate with the patient's age. Moderate periventricular white matter changes are present likely reflecting the sequela of small vessel ischemia. No evidence of acute intracranial hemorrhage or infarct. No abnormal mass effect or midline shift. No abnormal intra or extra-axial mass lesion or fluid collection. Ventricular size is normal. Cerebellum is unremarkable. Vascular: No asymmetric hyperdense vasculature noted at the skull base. Skull: Intact Sinuses/Orbits: Paranasal sinuses are clear. The orbits are unremarkable. Other: Mastoid air cells and middle ear cavities are clear. IMPRESSION: Stable remote infarcts including large right MCA territory infarct. No evidence of acute intracranial hemorrhage or infarct. No calvarial fracture. Electronically Signed   By: Fidela Salisbury MD   On: 03/28/2020 03:33   Labs on Admission: I have personally reviewed following labs  CBC: Recent Labs  Lab 03/22/20 1648 03/27/20 1323  WBC 6.6 4.2  HGB 13.2 13.7  HCT 40.4 41.9  MCV 92.2 92.3  PLT 248 665   Basic Metabolic Panel: Recent Labs  Lab 03/22/20 1648 03/27/20 1323  NA 139 137  K 3.5 3.4*  CL 100 99  CO2 25 25  GLUCOSE 187* 214*  BUN 19 18  CREATININE 1.11* 0.98  CALCIUM 9.1 8.6*   GFR: Estimated Creatinine Clearance: 46.1 mL/min (by C-G formula based on SCr of 0.98 mg/dL).  Liver Function Tests: Recent Labs  Lab  03/22/20 1648  AST 20  ALT 15  ALKPHOS 99  BILITOT 0.8  PROT 7.9  ALBUMIN 3.9   Cardiac Enzymes: Recent Labs  Lab 03/22/20 1648  CKTOTAL 301*   Urine analysis:    Component Value Date/Time   COLORURINE AMBER (A) 03/27/2020 1614   APPEARANCEUR CLOUDY (A) 03/27/2020 1614   LABSPEC 1.029 03/27/2020 1614   PHURINE 5.0 03/27/2020 1614   GLUCOSEU 150 (A) 03/27/2020 1614   HGBUR MODERATE (A) 03/27/2020 1614   BILIRUBINUR NEGATIVE 03/27/2020 1614   KETONESUR 5 (A) 03/27/2020 1614   PROTEINUR 100 (A) 03/27/2020 1614   NITRITE NEGATIVE 03/27/2020 1614   LEUKOCYTESUR TRACE (A) 03/27/2020 1614   Jeyli Zwicker N Melis Trochez D.O. [facility]  If 7PM-7AM, please contact overnight-coverage provider If 7AM-7PM, please contact day coverage provider [email or web address]  03/28/2020, 3:34 PM

## 2020-03-28 NOTE — ED Notes (Signed)
Pt repeatedly refusing breakfast. Pt drinking water, pt tolerating well. Pt taking po meds with water.

## 2020-03-28 NOTE — ED Notes (Signed)
Pt states not hungry at this time, will order diet tray for am.

## 2020-03-28 NOTE — ED Notes (Signed)
Pt repositioned in bed. Pt had removed O2 and O2 monitor. Pt reminded to leave O2 on. Pt states understanding. Will monitor for safety.

## 2020-03-28 NOTE — Progress Notes (Signed)
Received patient from the ER, transported via stretcher, accompanied by nursing personnel. VSS. Placed on telemetry and continuous pulse ox. On fall precautions (low bed and mat in placed). Pt is to person and place. Pt has a limited mobility on LUE and somewhat contracted. Call bell placed within reach. Orientation given, bed alarm activated. Fluids provided.

## 2020-03-28 NOTE — ED Notes (Signed)
Pt sleeping, wakes to voice

## 2020-03-28 NOTE — ED Notes (Addendum)
Pt unsure as to why she is in the hospital. Pt denies pain at this time and says she would like to go home. ER provider to see pt. Pt states she is able to take PO medications and is willing to take the macrobid. Pt laying in bed, bilateral side rails up. RN was informed from [facility] nurse that pt had a pervious stroke resulting in left sided deficits.

## 2020-03-28 NOTE — ED Notes (Signed)
No change in condition will continue to monitor  

## 2020-03-28 NOTE — ED Notes (Signed)
Lab called that blood cultures are not usable, IV antibiotics paused, lab to redraw blood cultures

## 2020-03-28 NOTE — ED Notes (Signed)
Report received from [facility]. Patient care assumed. Patient/RN introduction complete. Will continue to monitor.

## 2020-03-29 DIAGNOSIS — Z880 Allergy status to penicillin: Secondary | ICD-10-CM | POA: Diagnosis not present

## 2020-03-29 DIAGNOSIS — F0150 Vascular dementia without behavioral disturbance: Secondary | ICD-10-CM | POA: Diagnosis present

## 2020-03-29 DIAGNOSIS — R7881 Bacteremia: Secondary | ICD-10-CM | POA: Diagnosis not present

## 2020-03-29 DIAGNOSIS — Z91040 Latex allergy status: Secondary | ICD-10-CM | POA: Diagnosis not present

## 2020-03-29 DIAGNOSIS — J1282 Pneumonia due to coronavirus disease 2019: Secondary | ICD-10-CM | POA: Diagnosis present

## 2020-03-29 DIAGNOSIS — I69319 Unspecified symptoms and signs involving cognitive functions following cerebral infarction: Secondary | ICD-10-CM | POA: Diagnosis not present

## 2020-03-29 DIAGNOSIS — R4189 Other symptoms and signs involving cognitive functions and awareness: Secondary | ICD-10-CM | POA: Diagnosis not present

## 2020-03-29 DIAGNOSIS — F05 Delirium due to known physiological condition: Secondary | ICD-10-CM | POA: Diagnosis not present

## 2020-03-29 DIAGNOSIS — I69359 Hemiplegia and hemiparesis following cerebral infarction affecting unspecified side: Secondary | ICD-10-CM | POA: Diagnosis not present

## 2020-03-29 DIAGNOSIS — T380X5A Adverse effect of glucocorticoids and synthetic analogues, initial encounter: Secondary | ICD-10-CM | POA: Diagnosis not present

## 2020-03-29 DIAGNOSIS — Z7982 Long term (current) use of aspirin: Secondary | ICD-10-CM | POA: Diagnosis not present

## 2020-03-29 DIAGNOSIS — I69351 Hemiplegia and hemiparesis following cerebral infarction affecting right dominant side: Secondary | ICD-10-CM | POA: Diagnosis not present

## 2020-03-29 DIAGNOSIS — R627 Adult failure to thrive: Secondary | ICD-10-CM | POA: Diagnosis present

## 2020-03-29 DIAGNOSIS — Z7902 Long term (current) use of antithrombotics/antiplatelets: Secondary | ICD-10-CM | POA: Diagnosis not present

## 2020-03-29 DIAGNOSIS — N3000 Acute cystitis without hematuria: Secondary | ICD-10-CM | POA: Diagnosis not present

## 2020-03-29 DIAGNOSIS — G9341 Metabolic encephalopathy: Secondary | ICD-10-CM | POA: Diagnosis not present

## 2020-03-29 DIAGNOSIS — Z79899 Other long term (current) drug therapy: Secondary | ICD-10-CM | POA: Diagnosis not present

## 2020-03-29 DIAGNOSIS — E785 Hyperlipidemia, unspecified: Secondary | ICD-10-CM | POA: Diagnosis present

## 2020-03-29 DIAGNOSIS — N390 Urinary tract infection, site not specified: Secondary | ICD-10-CM | POA: Diagnosis present

## 2020-03-29 DIAGNOSIS — J159 Unspecified bacterial pneumonia: Secondary | ICD-10-CM | POA: Diagnosis present

## 2020-03-29 DIAGNOSIS — U071 COVID-19: Secondary | ICD-10-CM | POA: Diagnosis present

## 2020-03-29 DIAGNOSIS — E1142 Type 2 diabetes mellitus with diabetic polyneuropathy: Secondary | ICD-10-CM | POA: Diagnosis present

## 2020-03-29 DIAGNOSIS — R531 Weakness: Secondary | ICD-10-CM | POA: Diagnosis not present

## 2020-03-29 DIAGNOSIS — F32A Depression, unspecified: Secondary | ICD-10-CM | POA: Diagnosis present

## 2020-03-29 DIAGNOSIS — I639 Cerebral infarction, unspecified: Secondary | ICD-10-CM | POA: Diagnosis not present

## 2020-03-29 DIAGNOSIS — I10 Essential (primary) hypertension: Secondary | ICD-10-CM | POA: Diagnosis present

## 2020-03-29 DIAGNOSIS — Z85828 Personal history of other malignant neoplasm of skin: Secondary | ICD-10-CM | POA: Diagnosis not present

## 2020-03-29 DIAGNOSIS — E876 Hypokalemia: Secondary | ICD-10-CM | POA: Diagnosis not present

## 2020-03-29 DIAGNOSIS — J9601 Acute respiratory failure with hypoxia: Secondary | ICD-10-CM | POA: Diagnosis present

## 2020-03-29 DIAGNOSIS — E1165 Type 2 diabetes mellitus with hyperglycemia: Secondary | ICD-10-CM | POA: Diagnosis not present

## 2020-03-29 LAB — COMPREHENSIVE METABOLIC PANEL
ALT: 18 U/L (ref 0–44)
ALT: 20 U/L (ref 0–44)
AST: 25 U/L (ref 15–41)
AST: 26 U/L (ref 15–41)
Albumin: 3 g/dL — ABNORMAL LOW (ref 3.5–5.0)
Albumin: 3.1 g/dL — ABNORMAL LOW (ref 3.5–5.0)
Alkaline Phosphatase: 79 U/L (ref 38–126)
Alkaline Phosphatase: 84 U/L (ref 38–126)
Anion gap: 12 (ref 5–15)
Anion gap: 13 (ref 5–15)
BUN: 22 mg/dL (ref 8–23)
BUN: 23 mg/dL (ref 8–23)
CO2: 24 mmol/L (ref 22–32)
CO2: 26 mmol/L (ref 22–32)
Calcium: 7.9 mg/dL — ABNORMAL LOW (ref 8.9–10.3)
Calcium: 8.5 mg/dL — ABNORMAL LOW (ref 8.9–10.3)
Chloride: 102 mmol/L (ref 98–111)
Chloride: 104 mmol/L (ref 98–111)
Creatinine, Ser: 1.06 mg/dL — ABNORMAL HIGH (ref 0.44–1.00)
Creatinine, Ser: 1.14 mg/dL — ABNORMAL HIGH (ref 0.44–1.00)
GFR, Estimated: 52 mL/min — ABNORMAL LOW (ref >60)
GFR, Estimated: 57 mL/min — ABNORMAL LOW (ref >60)
Glucose, Bld: 182 mg/dL — ABNORMAL HIGH (ref 70–99)
Glucose, Bld: 225 mg/dL — ABNORMAL HIGH (ref 70–99)
Potassium: 3.3 mmol/L — ABNORMAL LOW (ref 3.5–5.1)
Potassium: 3.6 mmol/L (ref 3.5–5.1)
Sodium: 138 mmol/L (ref 135–145)
Sodium: 143 mmol/L (ref 135–145)
Total Bilirubin: 0.6 mg/dL (ref 0.3–1.2)
Total Bilirubin: 0.7 mg/dL (ref 0.3–1.2)
Total Protein: 6.4 g/dL — ABNORMAL LOW (ref 6.5–8.1)
Total Protein: 6.7 g/dL (ref 6.5–8.1)

## 2020-03-29 LAB — CBC WITH DIFFERENTIAL/PLATELET
Abs Immature Granulocytes: 0.02 10*3/uL (ref 0.00–0.07)
Basophils Absolute: 0 10*3/uL (ref 0.0–0.1)
Basophils Relative: 0 %
Eosinophils Absolute: 0 10*3/uL (ref 0.0–0.5)
Eosinophils Relative: 0 %
HCT: 41.1 % (ref 36.0–46.0)
Hemoglobin: 13.6 g/dL (ref 12.0–15.0)
Immature Granulocytes: 1 %
Lymphocytes Relative: 39 %
Lymphs Abs: 0.9 10*3/uL (ref 0.7–4.0)
MCH: 30.3 pg (ref 26.0–34.0)
MCHC: 33.1 g/dL (ref 30.0–36.0)
MCV: 91.5 fL (ref 80.0–100.0)
Monocytes Absolute: 0.2 10*3/uL (ref 0.1–1.0)
Monocytes Relative: 10 %
Neutro Abs: 1.2 10*3/uL — ABNORMAL LOW (ref 1.7–7.7)
Neutrophils Relative %: 50 %
Platelets: 187 10*3/uL (ref 150–400)
RBC: 4.49 MIL/uL (ref 3.87–5.11)
RDW: 13.2 % (ref 11.5–15.5)
Smear Review: NORMAL
WBC: 2.4 10*3/uL — ABNORMAL LOW (ref 4.0–10.5)
nRBC: 0 % (ref 0.0–0.2)

## 2020-03-29 LAB — C-REACTIVE PROTEIN: CRP: 0.7 mg/dL (ref <1.0)

## 2020-03-29 LAB — HIV ANTIBODY (ROUTINE TESTING W REFLEX): HIV Screen 4th Generation wRfx: NONREACTIVE

## 2020-03-29 LAB — URINE CULTURE

## 2020-03-29 LAB — FIBRIN DERIVATIVES D-DIMER (ARMC ONLY): Fibrin derivatives D-dimer (ARMC): 2819.47 ng/mL (FEU) — ABNORMAL HIGH (ref 0.00–499.00)

## 2020-03-29 LAB — GLUCOSE, CAPILLARY: Glucose-Capillary: 220 mg/dL — ABNORMAL HIGH (ref 70–99)

## 2020-03-29 IMAGING — US US CAROTID DUPLEX BILAT
1 series · 13 of 24 positions shown · non-contrast
Comparison: CTA neck 06/23/2016

CLINICAL DATA: Slurred speech. Previous right carotid
endarterectomy.

EXAM:
BILATERAL CAROTID DUPLEX ULTRASOUND
TECHNIQUE: Gray scale imaging, color Doppler and duplex ultrasound were
performed of bilateral carotid and vertebral arteries in the neck.

[Series 1: us carotid duplex bilat · 13 of 66 slices shown]
[im 1/66]
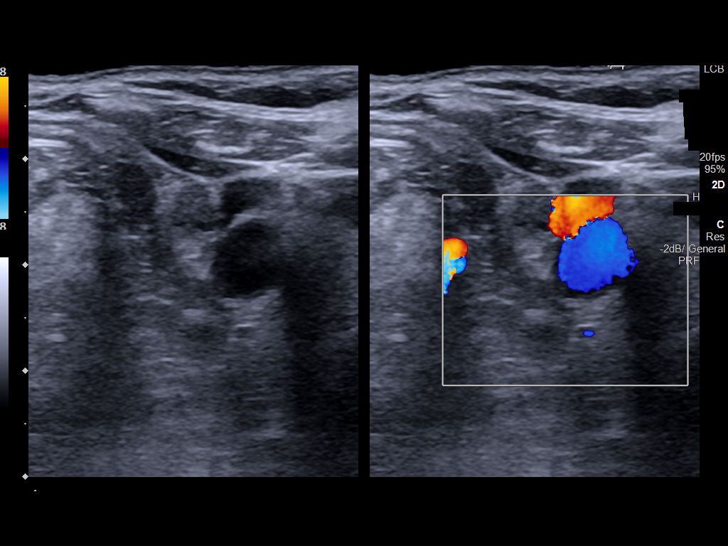
[im 6/66]
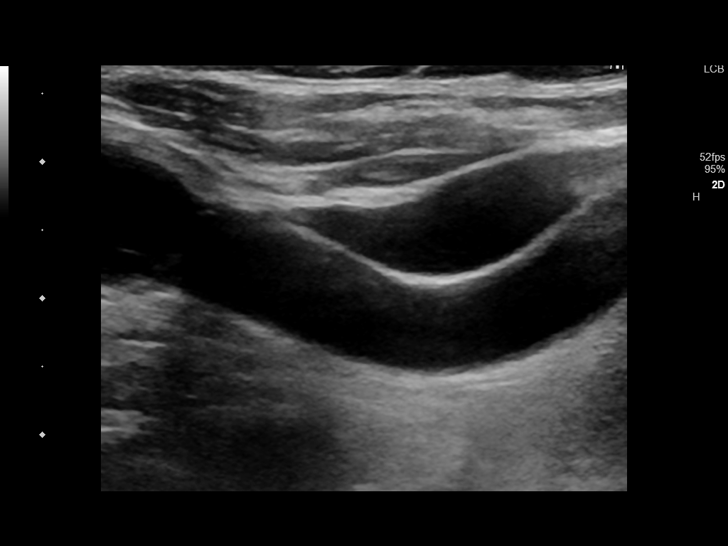
[im 12/66]
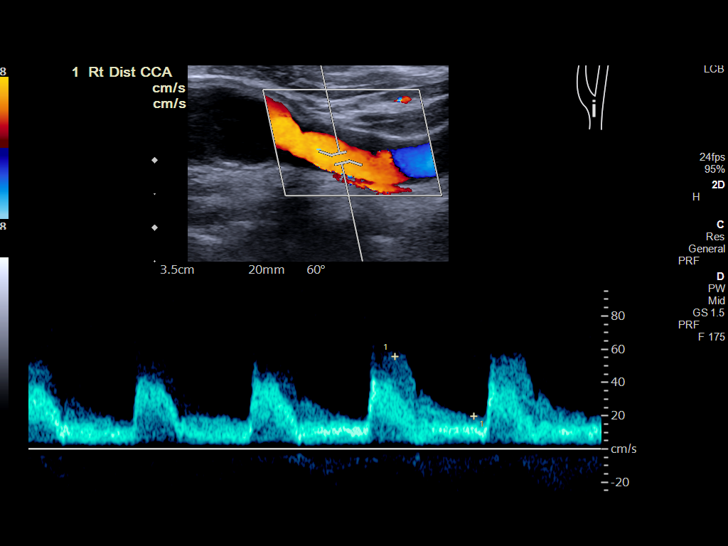
[im 17/66]
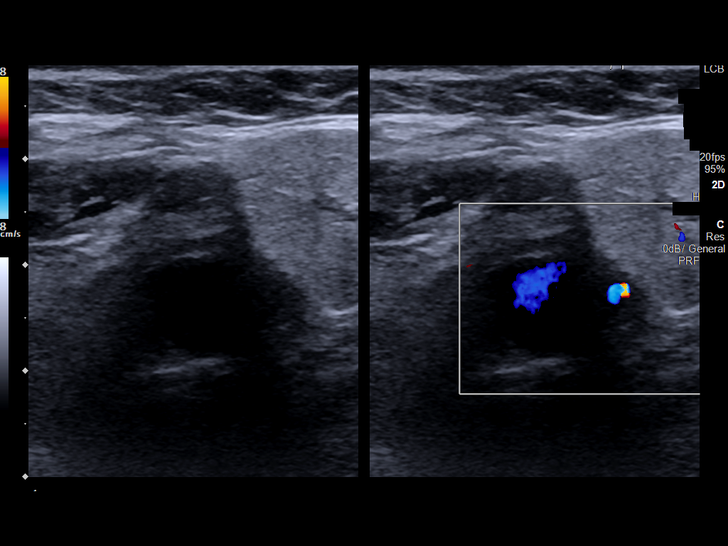
[im 23/66]
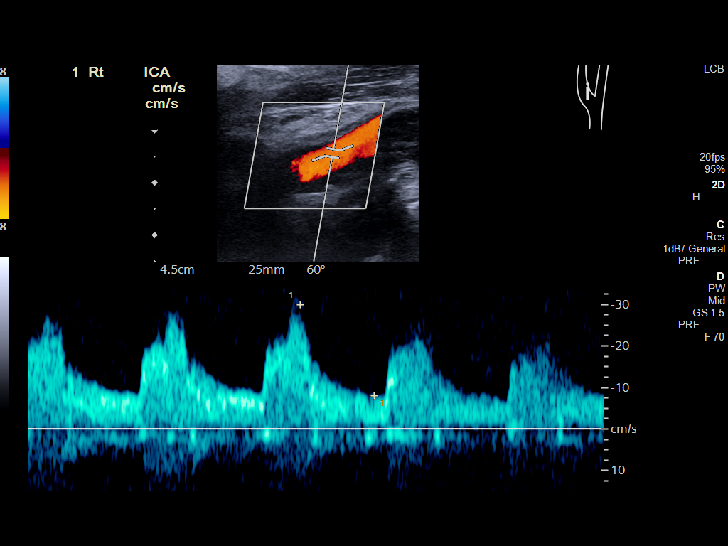
[im 29/66]
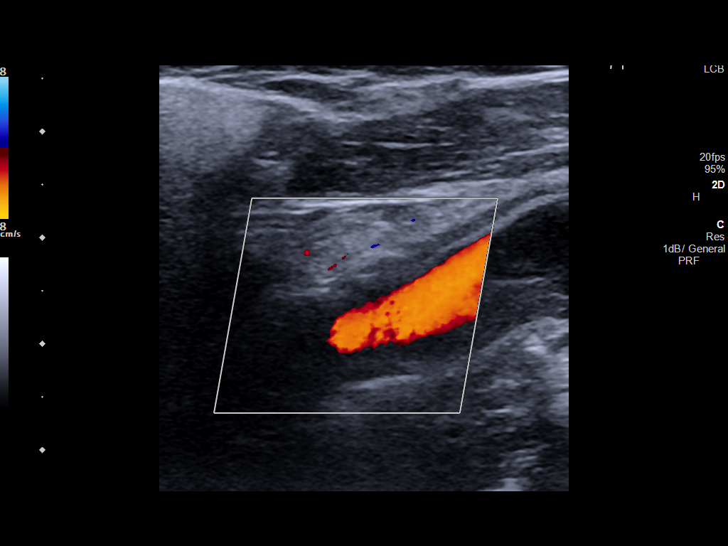
[im 34/66]
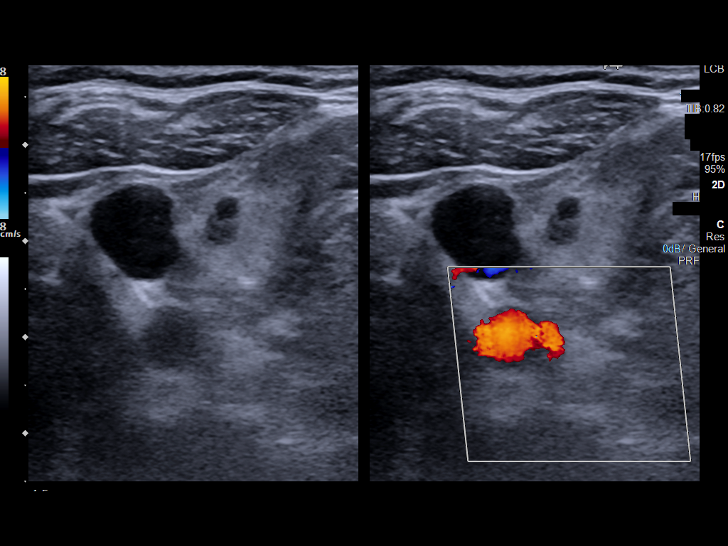
[im 37/66]
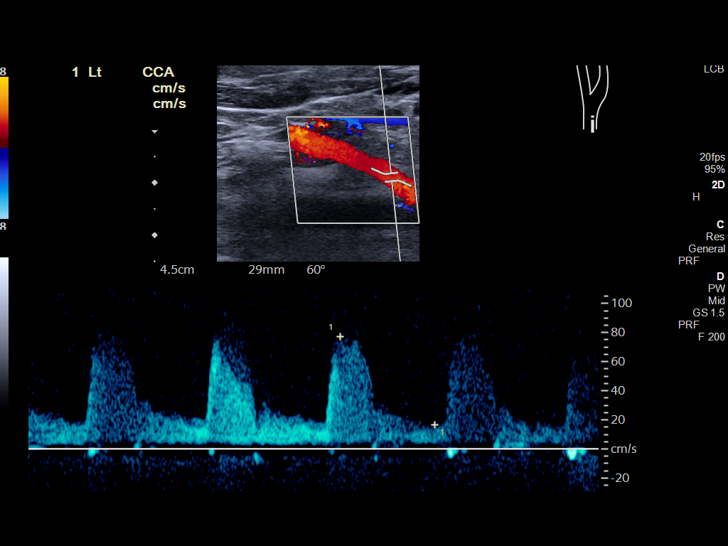
[im 43/66]
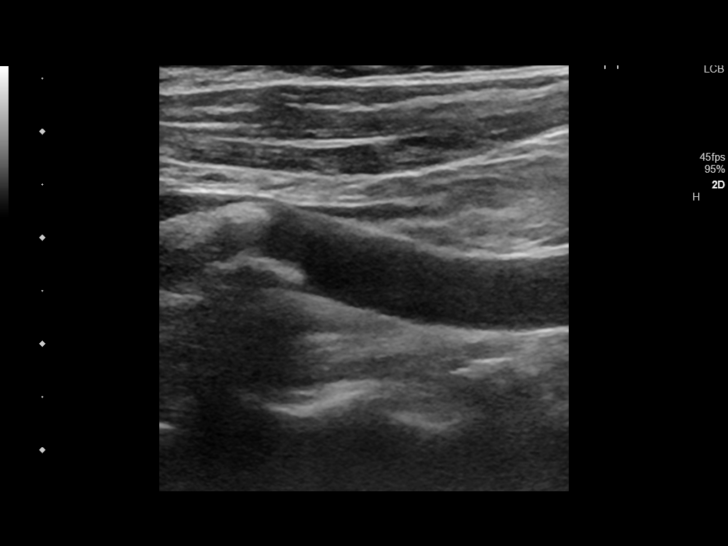
[im 49/66]
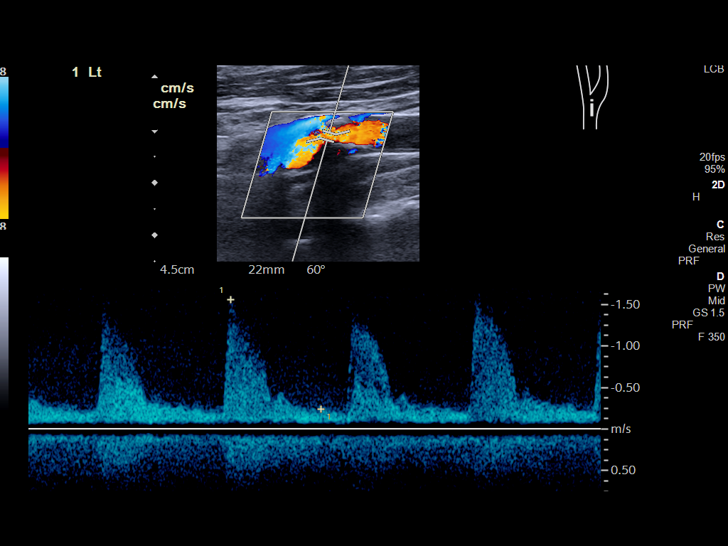
[im 54/66]
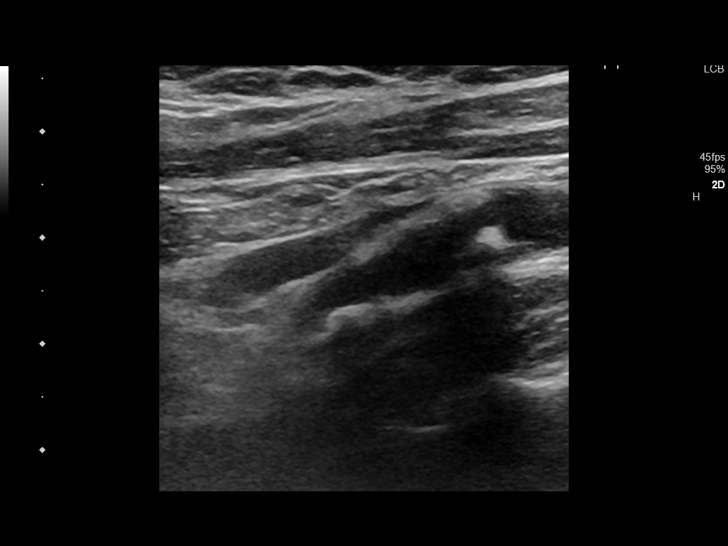
[im 60/66]
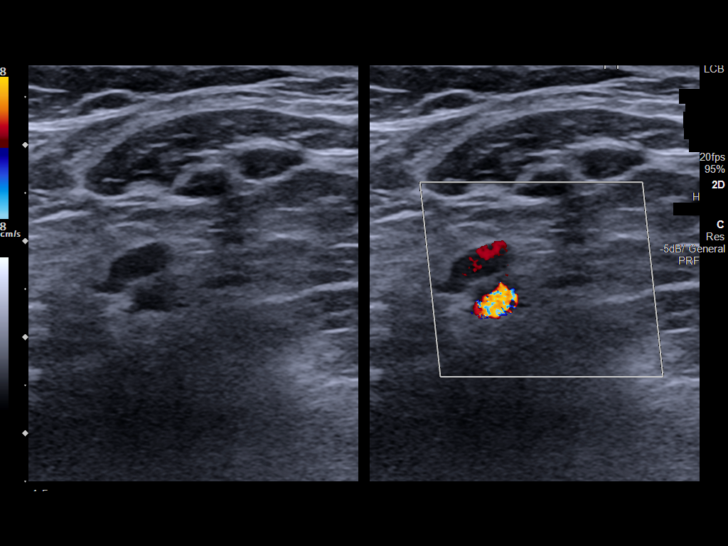
[im 66/66]
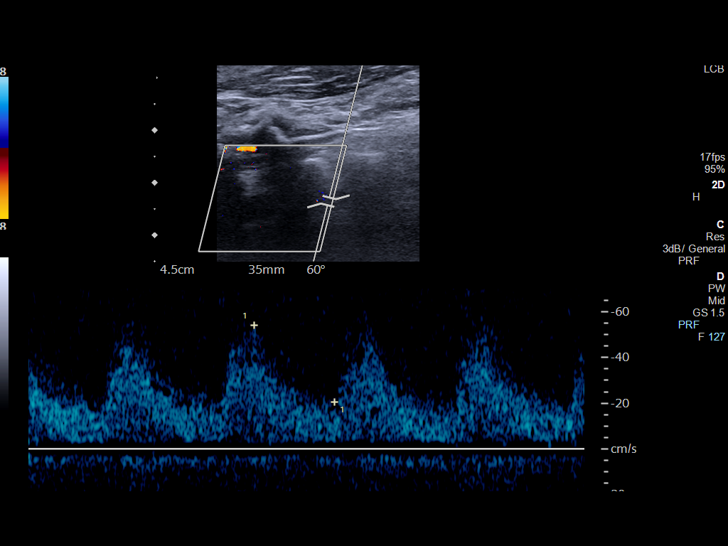

[13 of 24 positions shown; findings below may reference images not displayed]

FINDINGS: Criteria: Quantification of carotid stenosis is based on velocity
parameters that correlate the residual internal carotid diameter
with NASCET-based stenosis levels, using the diameter of the distal
internal carotid lumen as the denominator for stenosis measurement.

The following velocity measurements were obtained:

RIGHT

ICA: 31/12 cm/sec

CCA: 44/11 cm/sec

SYSTOLIC ICA/CCA RATIO:

ECA: 134 cm/sec

LEFT

ICA: 138/50 cm/sec

CCA: 70/15 cm/sec

SYSTOLIC ICA/CCA RATIO:

ECA: 128 cm/sec

RIGHT CAROTID ARTERY: Mildly tortuous. Moderate hypoechoic plaque or
thrombus in the bulb extending into proximal ICA without high-grade
stenosis. Limited color Doppler signal in the right external carotid
artery, with normal waveform demonstrated.

RIGHT VERTEBRAL ARTERY:  Normal flow direction and waveform.

LEFT CAROTID ARTERY: Eccentric plaque in the bulb and proximal ICA
resulting in at least mild stenosis. Mildly elevated peak systolic
velocities at the stenosis. Otherwise normal waveforms and color
Doppler signal.

LEFT VERTEBRAL ARTERY:  Normal flow direction and waveform.
IMPRESSION: 1. Moderate residual/recurrent eccentric plaque or thrombus in the
right carotid bulb post endarterectomy, without high-grade stenosis.
2. Proximal left ICA plaque resulting in 50-69% diameter stenosis.
3.  Antegrade bilateral vertebral arterial flow.

## 2020-03-29 IMAGING — MR MR MRA HEAD W/O CM
1 series · 10 of 48 positions shown · non-contrast
Comparison: Brain MRI 11/05/2017

Head CTA 06/23/2016

CLINICAL DATA: Slurred speech

EXAM:
MRA HEAD WITHOUT CONTRAST
TECHNIQUE: Angiographic images of the Circle of Willis were obtained using MRA
technique without intravenous contrast.

[Series 5: TOF · axial · 0.5mm · 0.27mm/px · z∈[-11,+52]mm · 10 of 162 slices shown]
[im 11/162]
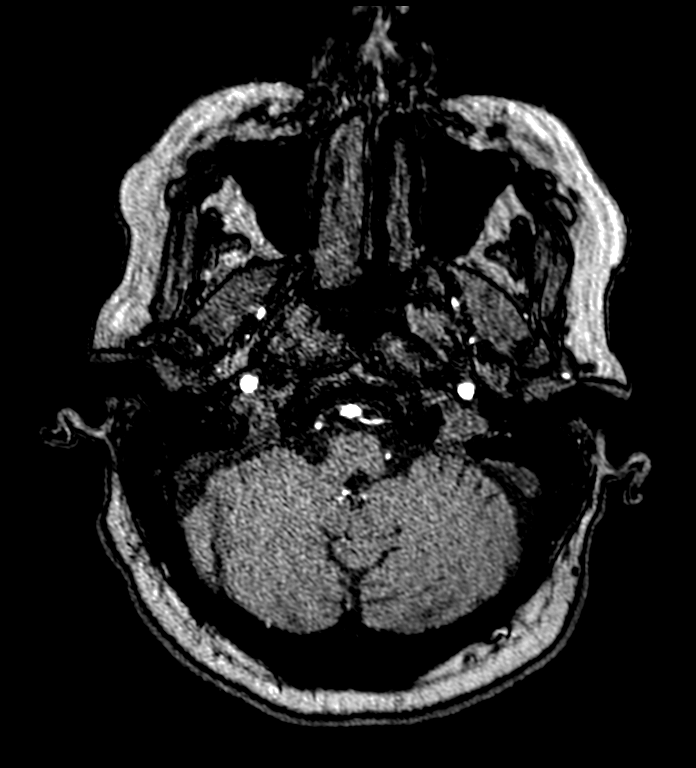
[im 28/162]
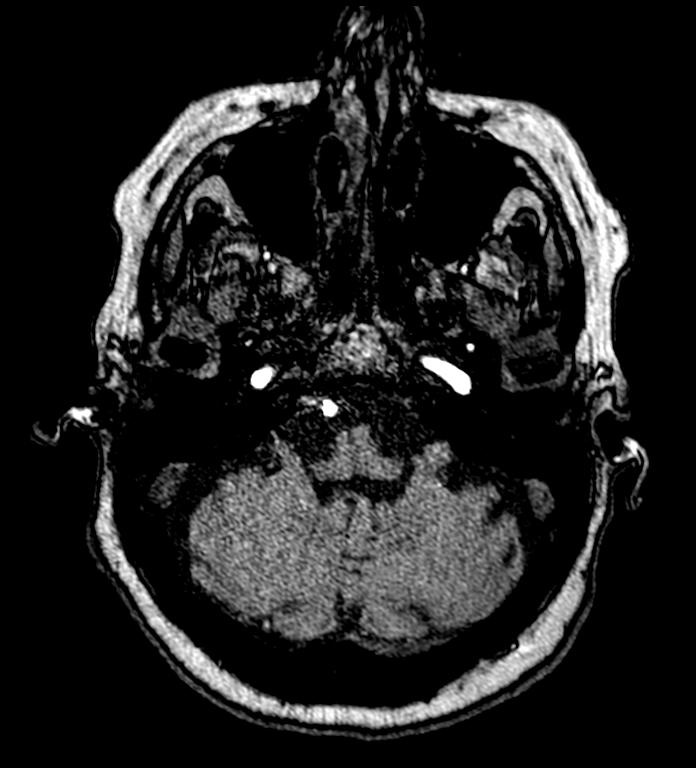
[im 31/162]
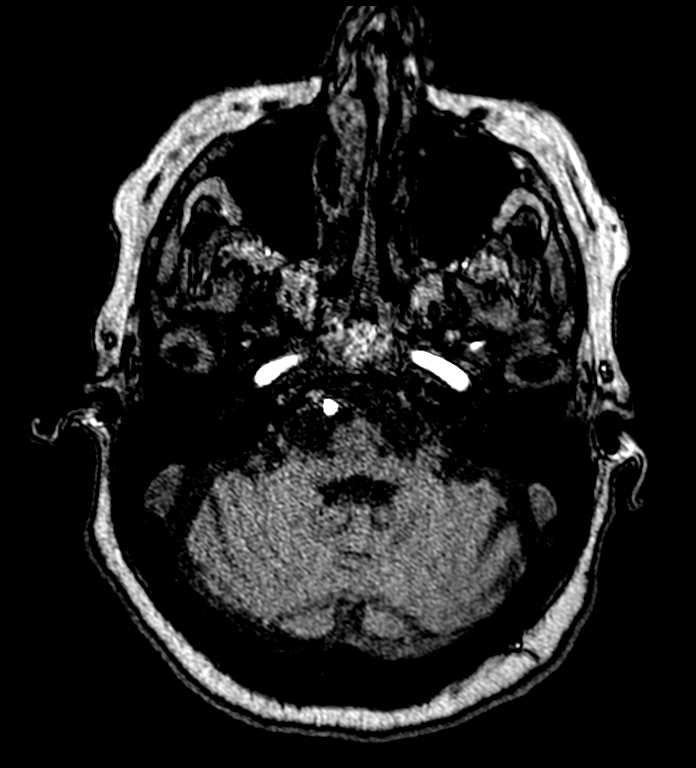
[im 52/162]
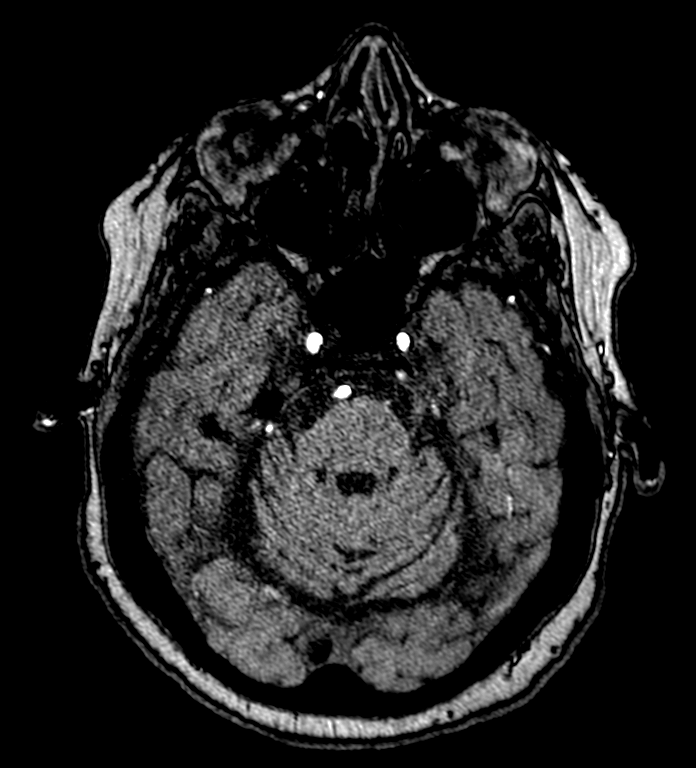
[im 72/162]
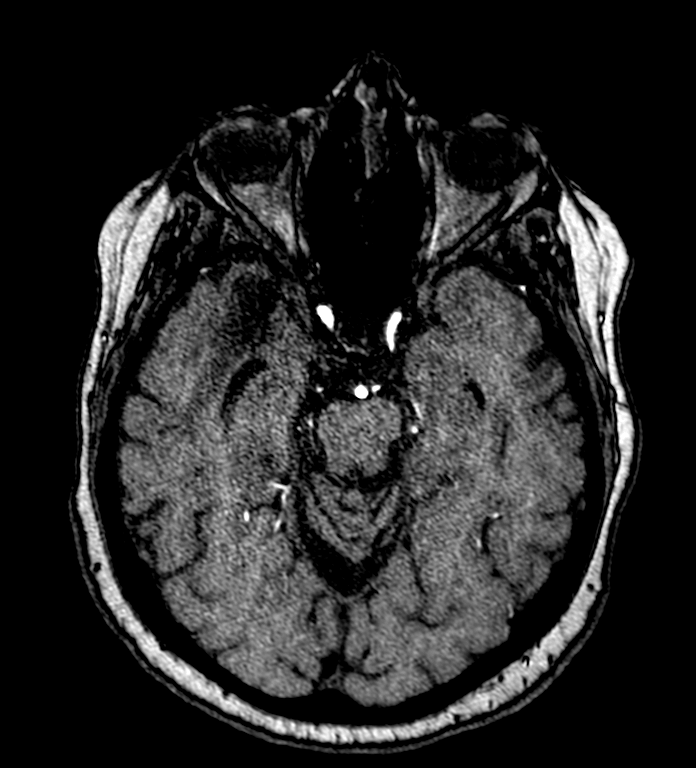
[im 83/162]
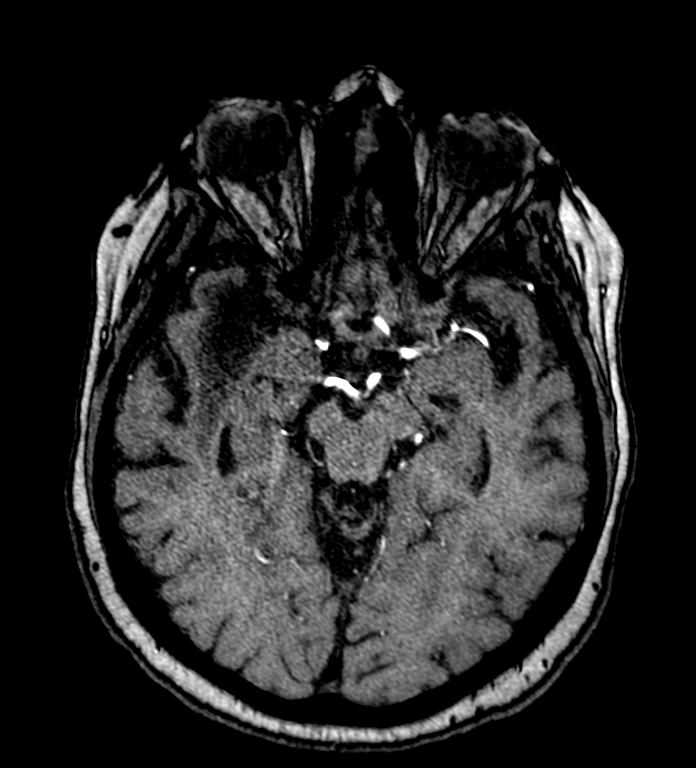
[im 93/162]
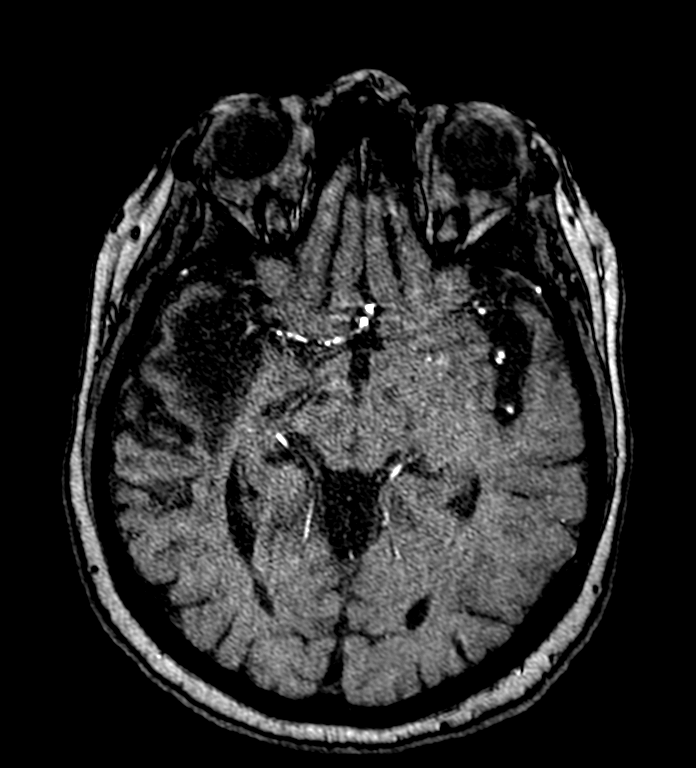
[im 114/162]
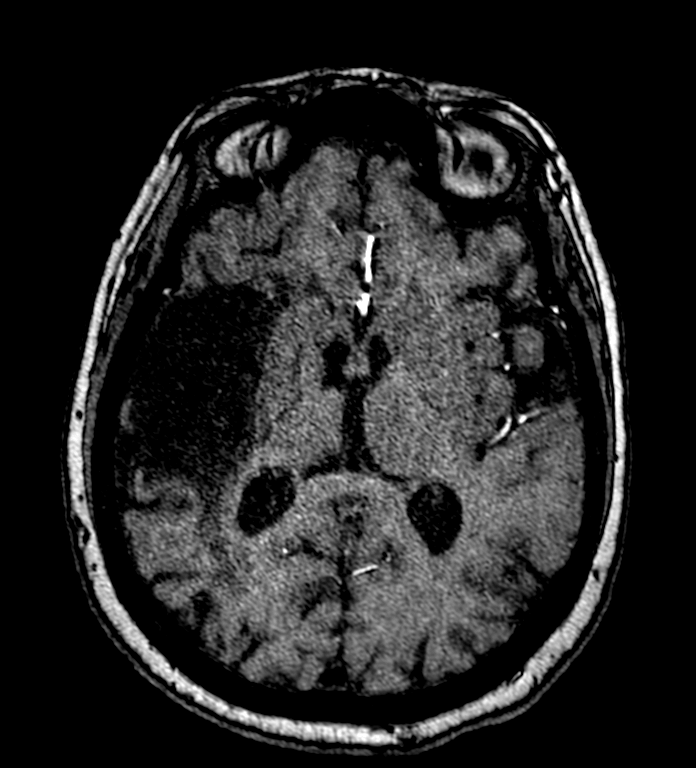
[im 134/162]
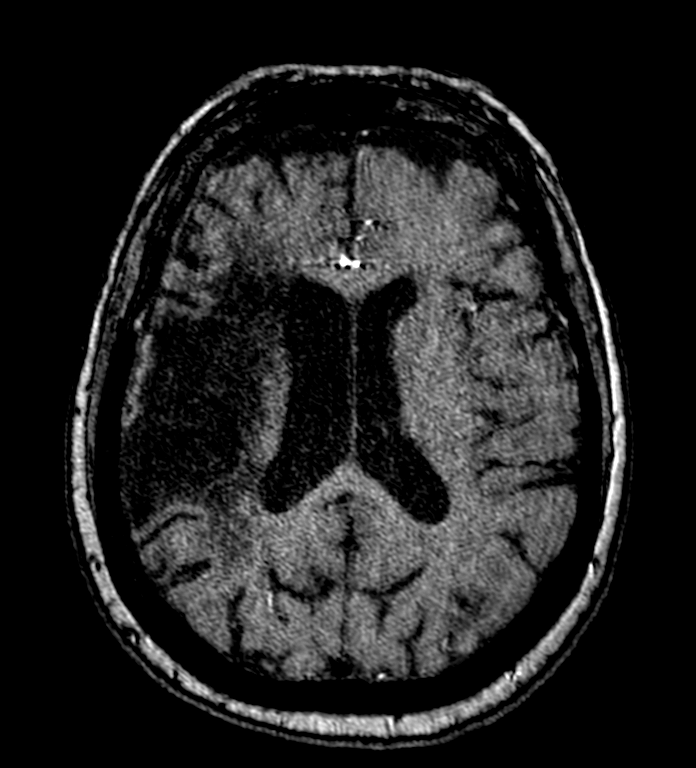
[im 138/162]
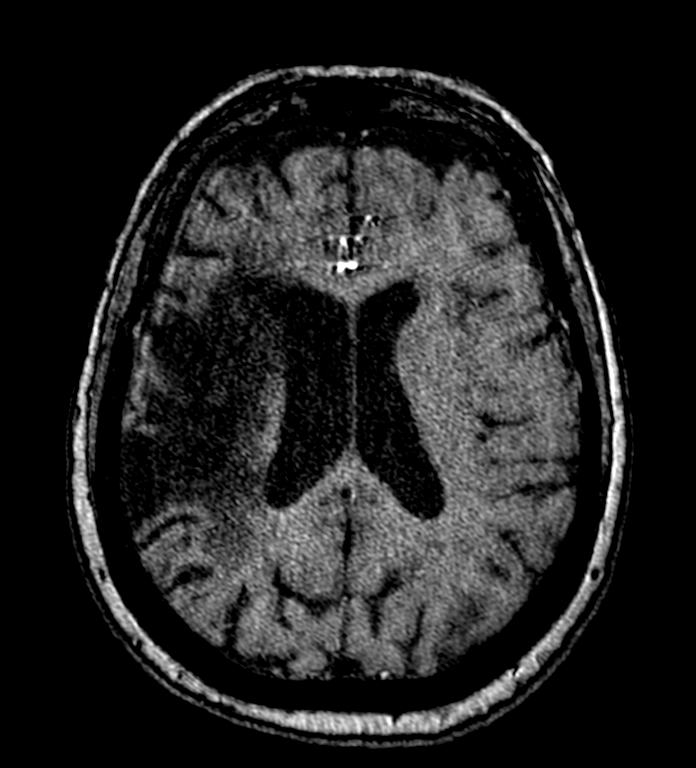

[10 of 48 positions shown; findings below may reference images not displayed]

FINDINGS: ANTERIOR CIRCULATION:

--Intracranial internal carotid arteries: Normal.

--Anterior cerebral arteries: There is severe narrowing of the
proximal A1 segments bilaterally. The A2 segments and distal
branches of the anterior cerebral arteries are normal. The degree of
proximal stenosis appears increased compared to the CTA from
06/23/2016, though MRA may overestimate stenosis relative to CTA.

--Middle cerebral arteries: Chronic high-grade stenosis/multifocal
occlusion of the right middle cerebral artery is unchanged. There is
severe stenosis of the mid M1 segment of the left middle cerebral
artery, also unchanged. Distal left MCA branches are patent.

--Posterior communicating arteries: Patent on the right. Not seen on
the left.

POSTERIOR CIRCULATION:

--Basilar artery: Normal.

--Posterior cerebral arteries: Normal.

--Superior cerebellar arteries: Normal.

--Inferior cerebellar arteries: Normal anterior and posterior
inferior cerebellar arteries.
IMPRESSION: 1. Severe chronic multifocal stenosis of the right middle cerebral
artery, unchanged from 06/23/2016, with chronic right MCA territory
infarct.
2. Severe stenosis the mid M1 segment of the left middle cerebral
artery is unchanged from 06/23/2016, allowing for differences in
technique.
3. Severe stenosis both anterior cerebral artery A1 segments
proximally. This appears to have worsened since 06/23/2016, though
differences in modality limit the accuracy of the comparison.

## 2020-03-29 MED ORDER — INSULIN ASPART 100 UNIT/ML ~~LOC~~ SOLN
0.0000 [IU] | Freq: Three times a day (TID) | SUBCUTANEOUS | Status: DC
  Administered 2020-03-30 – 2020-03-31 (×2): 3 [IU] via SUBCUTANEOUS
  Administered 2020-04-01 (×2): 2 [IU] via SUBCUTANEOUS
  Administered 2020-04-02: 1 [IU] via SUBCUTANEOUS
  Administered 2020-04-02: 3 [IU] via SUBCUTANEOUS
  Administered 2020-04-02: 13:00:00 2 [IU] via SUBCUTANEOUS
  Administered 2020-04-03: 3 [IU] via SUBCUTANEOUS
  Administered 2020-04-03 (×2): 1 [IU] via SUBCUTANEOUS
  Administered 2020-04-04: 3 [IU] via SUBCUTANEOUS
  Administered 2020-04-04: 09:00:00 1 [IU] via SUBCUTANEOUS
  Administered 2020-04-05 (×2): 2 [IU] via SUBCUTANEOUS
  Administered 2020-04-05: 14:00:00 1 [IU] via SUBCUTANEOUS
  Administered 2020-04-06: 2 [IU] via SUBCUTANEOUS
  Administered 2020-04-06: 5 [IU] via SUBCUTANEOUS
  Administered 2020-04-06: 1 [IU] via SUBCUTANEOUS
  Administered 2020-04-07: 2 [IU] via SUBCUTANEOUS
  Administered 2020-04-07: 08:00:00 1 [IU] via SUBCUTANEOUS
  Administered 2020-04-07: 17:00:00 2 [IU] via SUBCUTANEOUS
  Administered 2020-04-08: 9 [IU] via SUBCUTANEOUS
  Administered 2020-04-08: 17:00:00 1 [IU] via SUBCUTANEOUS
  Administered 2020-04-09: 13:00:00 3 [IU] via SUBCUTANEOUS
  Filled 2020-03-29 (×23): qty 1

## 2020-03-29 MED ORDER — MELATONIN 3 MG PO TABS
3.0000 mg | ORAL_TABLET | Freq: Every day | ORAL | Status: DC
  Administered 2020-03-29: 23:00:00 3 mg via ORAL
  Filled 2020-03-29: qty 1

## 2020-03-29 MED ORDER — LEVOFLOXACIN IN D5W 250 MG/50ML IV SOLN
250.0000 mg | INTRAVENOUS | Status: DC
  Administered 2020-03-29 – 2020-03-31 (×3): 250 mg via INTRAVENOUS
  Filled 2020-03-29 (×5): qty 50

## 2020-03-29 NOTE — Progress Notes (Signed)
Pt continues to be sleeping soundly at this time

## 2020-03-29 NOTE — TOC Initial Note (Signed)
Transition of Care [facility]) - Initial/Assessment Note    Patient Details  Name: Erin Hamilton MRN: [id number] Date of Birth: [birth year]  Transition of Care [facility]) CM/SW Contact:    Shelbie Hutching, RN Phone Number: 03/29/2020, 2:23 PM  Clinical Narrative:                 Patient placed under observation for COVID.  Patient is currently on 2L [place].  Patient does not wear oxygen at home and is able to feed and dress herself.  Patient's daughter, [name], has been living with her and helps with bathing and toileting, and provides all transportation.  PT has recommended SNF.  RNCM was able to speak with daughter Lattie Haw who reports that [name] will have to make the decision on SNF.  RNCM has left a message for [name] for return call.    Expected Discharge Plan: Skilled Nursing Facility Barriers to Discharge: Continued Medical Work up   Patient Goals and CMS Choice   CMS Medicare.gov Compare Post Acute Care list provided to:: Patient Represenative (must comment) Choice offered to / list presented to : Adult Children  Expected Discharge Plan and Services Expected Discharge Plan: [place]   Discharge Planning Services: CM Consult Post Acute Care Choice: [place] Living arrangements for the past 2 months: Single Family Home                                      Prior Living Arrangements/Services Living arrangements for the past 2 months: Single Family Home Lives with:: Adult Children Patient language and need for interpreter reviewed:: Yes Do you feel safe going back to the place where you live?: Yes      Need for Family Participation in Patient Care: Yes (Comment) (COVID, weakness) Care giver support system in place?: Yes (comment) (daughters) Current home services: DME (walker) Criminal Activity/Legal Involvement Pertinent to Current Situation/Hospitalization: No - Comment as needed  Activities of Daily Living   ADL Screening (condition at time of  admission) Patient's cognitive ability adequate to safely complete daily activities?: Yes Is the patient deaf or have difficulty hearing?: No Does the patient have difficulty seeing, even when wearing glasses/contacts?: No Does the patient have difficulty concentrating, remembering, or making decisions?: Yes Patient able to express need for assistance with ADLs?: Yes Does the patient have difficulty dressing or bathing?: Yes Independently performs ADLs?: Yes (appropriate for developmental age) Does the patient have difficulty walking or climbing stairs?: Yes Weakness of Legs: None Weakness of Arms/Hands: Left  Permission Sought/Granted Permission sought to share information with : Case Manager,Family Supports,Other (comment) Permission granted to share information with : Yes, Verbal Permission Granted  Share Information with NAME: Esau Grew  Permission granted to share info w AGENCY: SNF's  Permission granted to share info w Relationship: daughter's     Emotional Assessment       Orientation: : Oriented to Self Alcohol / Substance Use: Not Applicable Psych Involvement: No (comment)  Admission diagnosis:  Lower urinary tract infectious disease [N39.0] Generalized weakness [R53.1] CVA, old, hemiparesis ([place]) [I69.359] Sepsis ([place]) [A41.9] Acute hypoxemic respiratory failure due to COVID-19 ([place]) [U07.1, J96.01] Patient Active Problem List   Diagnosis Date Noted  . Acute hypoxemic respiratory failure due to COVID-19 ([place]) 03/28/2020  . UTI (urinary tract infection) 11/06/2017  . Hemiparesis affecting left side as late effect of cerebrovascular accident ([place]) 06/24/2016  . Bilateral  carotid artery stenosis   . H/O carotid endarterectomy   . Cognitive dysfunction due to acute cerebrovascular accident (CVA) ([place]) 06/23/2016  . H/O: stroke 11/19/2015  . Health care maintenance 11/19/2015  . Diabetes mellitus without complication ([place]) 25/95/6387  . Essential hypertension,  benign 10/10/2014  . Back pain with left-sided sciatica 10/10/2014  . Acute anxiety 10/09/2014  . Stroke ([place]) 10/09/2014  . Hyperlipidemia 10/09/2014  . Depression 10/09/2014  . Hypertensive CKD (chronic kidney disease) 10/09/2014  . Type 2 diabetes mellitus with hyperglycemia, without long-term current use of insulin ([place]) 09/22/2009  . ACUTE SINUSITIS, UNSPECIFIED 09/22/2009   PCP:  Kirk Ruths, MD Pharmacy:   [facility], [place] [address] Phone: [phone] Fax: [phone]  [facility]) - [place], [place] [address] Phone: [phone] Fax: [phone]  [id number] [place] [place] [address] [place] [address] Phone: [phone] Fax: [phone]  [id number] [place], [place] [address] Phone: [phone] Fax: [phone]     Social Determinants of Health (SDOH) Interventions    Readmission Risk Interventions No flowsheet data found.

## 2020-03-29 NOTE — Progress Notes (Signed)
Attempted to call daughter to update on patient's care but there was no answer.  No message left on voicemail

## 2020-03-29 NOTE — Progress Notes (Signed)
PROGRESS NOTE    Erin Hamilton   [id number]  DOB: [birth year]  PCP: Kirk Ruths, MD    DOA: 03/28/2020 LOS: 0   Brief Narrative / Hospital Course to-date   70 y.o. female with medical history significant for dementia, failure to thrive, prior history of stroke in September 2019 with residual right-sided weakness, hyperlipidemia, diabetes mellitus, hypertension, presented to the ED on 03/28/2020 via EMS from home for chief concerns of weakness and frequent falls.  Daughter reported patient having difficulty walking for about 5 days.    Evaluation in the ED revealed hypoxia with O2 sat of 86 to 89% on room air, requiring 2 L/min supplemental oxygen.  COVID-19 PCR was positive.  Chest x-ray showed patchy interstitial opacities in both lung bases reflecting multifocal COVID-19 pneumonia.  Admitted to hospitalist service for further management of acute respiratory failure with hypoxia secondary to COVID-19 pneumonia.  Started on remdesivir and Solu-Medrol in addition to supportive care.    Significant Events: - admitted 03/28/20  Date of +Covid Test: 03/28/20 in ED  Vaccination status: unvaccinated  Assessment & Plan   Active Problems:   Acute hypoxemic respiratory failure due to COVID-19 [facility])   Acute respiratory failure with hypoxia secondary to multifocal COVID-19 pneumonia -present on admission. --Continue remdesivir and Solu-Medrol --Supportive care with antitussives, bronchodilators --Continue antibiotics for possible superimposed bacterial infection --Incentive spirometry and flutter valve for pulmonary hygiene --Follow inflammatory markers, CBC, CMP daily --Oxygen as needed, maintain O2 sat 88 to 93%, wean as tolerated  General debility, generalized weakness, failure to thrive -TOC consulted.  PT and OT have evaluated and recommend SNF at discharge.    Abnormal UA -initial urine culture grew multiple species.  Repeat urine culture is pending, follow-up.   Patient does have suprapubic tenderness and is on empiric Levaquin which we will continue pending culture.  Hypertension -continue home lisinopril  Non-insulin-dependent type 2 diabetes -hold Metformin in case of need for contrast CT in patient with Covid at high risk for pulmonary embolism.  Cover with sliding scale NovoLog for now.  Anticipate steroid-induced hyperglycemia.  Hyperlipidemia -continue home Lipitor  History of stroke, left MCA -with residual right-sided weakness.  Continue home Plavix, aspirin, Lipitor  Peripheral neuropathy -most likely secondary to diabetes.  Continue home gabapentin   DVT prophylaxis: enoxaparin (LOVENOX) injection 40 mg Start: 03/28/20 2200 Place TED hose Start: 03/28/20 1529   Diet:  Diet Orders (From admission, onward)    Start     Ordered   03/28/20 1526  Diet Heart Room service appropriate? Yes; Fluid consistency: Thin  Diet effective now       Question Answer Comment  Room service appropriate? Yes   Fluid consistency: Thin      03/28/20 1526            Code Status: Partial Code    Subjective 03/29/20    Patient seen today and reported feeling quite weak.  She denies shortness of breath, has mild cough which is mostly dry.  Denies nausea vomiting or diarrhea.  Confirms she has had falls and not been able to get around at home like normal.  Endorses poor appetite as well.   Disposition Plan & Communication   Status is: Inpatient  Remains inpatient appropriate because:Inpatient level of care appropriate due to severity of illness, on IV therapies as above for COVID-19 pneumonia and probable UTI.   Dispo: The patient is from: Home  Anticipated d/c is to: SNF              Anticipated d/c date is: 2 days              Patient currently is not medically stable to d/c.   Difficult to place patient No   Family Communication: None at bedside during encounter will attempt to call   Consults, Procedures, Treatments    Consultants:   None  Procedures:   None  Covid-specific treatments:  Antimicrobials:  Anti-infectives (From admission, onward)   Start     Dose/Rate Route Frequency Ordered Stop   03/29/20 1000  remdesivir 100 mg in sodium chloride 0.9 % 100 mL IVPB       "Followed by" Linked Group Details   100 mg 200 mL/hr over 30 Minutes Intravenous Daily 03/28/20 1512 04/02/20 0959   03/29/20 1000  remdesivir 100 mg in sodium chloride 0.9 % 100 mL IVPB  Status:  Discontinued       "Followed by" Linked Group Details   100 mg 200 mL/hr over 30 Minutes Intravenous Daily 03/28/20 1530 03/28/20 1531   03/28/20 1545  remdesivir 200 mg in sodium chloride 0.9% 250 mL IVPB  Status:  Discontinued       "Followed by" Linked Group Details   200 mg 580 mL/hr over 30 Minutes Intravenous Once 03/28/20 1530 03/28/20 1531   03/28/20 1515  remdesivir 200 mg in sodium chloride 0.9% 250 mL IVPB       "Followed by" Linked Group Details   200 mg 580 mL/hr over 30 Minutes Intravenous Once 03/28/20 1512 03/28/20 1905   03/28/20 1415  levofloxacin (LEVAQUIN) IVPB 750 mg        750 mg 100 mL/hr over 90 Minutes Intravenous  Once 03/28/20 1407 03/28/20 1808   03/28/20 1000  nitrofurantoin (macrocrystal-monohydrate) (MACROBID) capsule 100 mg        100 mg Oral Every 12 hours 03/28/20 0441     03/28/20 0230  nitrofurantoin (macrocrystal-monohydrate) (MACROBID) capsule 100 mg        100 mg Oral  Once 03/28/20 0229 03/28/20 0255        Objective   Vitals:   03/29/20 0415 03/29/20 0812 03/29/20 1208 03/29/20 1500  BP: 114/86 (!) 156/63 115/83 121/85  Pulse: 67 70 74 76  Resp: 16 16 16 16   Temp: 97.7 F (36.5 C) 97.7 F (36.5 C) 98.1 F (36.7 C) 98 F (36.7 C)  TempSrc:      SpO2: 98% 98% 95% 96%  Weight:      Height:        Intake/Output Summary (Last 24 hours) at 03/29/2020 1645 Last data filed at 03/29/2020 9622 Gross per 24 hour  Intake 2407.15 ml  Output --  Net 2407.15 ml   Filed Weights    03/27/20 1319 03/28/20 2033  Weight: 64 kg 64 kg    Physical Exam:  General exam: awake, alert, no acute distress HEENT: moist mucus membranes, hearing grossly normal, face is flushed Respiratory system: Decreased breath sounds, normal respiratory effort, on 2 L/min nasal cannula oxygen. Cardiovascular system:  RRR, no pedal edema, pedal pulses intact.   Gastrointestinal system: Suprapubic abdominal tenderness on palpation without rebound or guarding, abdomen is soft and otherwise nontender without distention, +bowel sounds. Central nervous system: no gross focal neurologic deficits, normal speech Skin: dry, intact, normal temperature  Labs   Data Reviewed: I have personally reviewed following labs and imaging studies  CBC: Recent Labs  Lab 03/22/20 1648 03/27/20 1323 03/28/20 1521 03/29/20 0402  WBC 6.6 4.2 4.8 2.4*  NEUTROABS  --   --  3.0 1.2*  HGB 13.2 13.7 13.7 13.6  HCT 40.4 41.9 41.8 41.1  MCV 92.2 92.3 92.7 91.5  PLT 248 203 186 [id number]   Basic Metabolic Panel: Recent Labs  Lab 03/22/20 1648 03/27/20 1323 03/28/20 2340 03/29/20 0402  NA 139 137 138 143  K 3.5 3.4* 3.3* 3.6  CL 100 99 102 104  CO2 25 25 24 26   GLUCOSE 187* 214* 225* 182*  BUN 19 18 23 22   CREATININE 1.11* 0.98 1.14* 1.06*  CALCIUM 9.1 8.6* 7.9* 8.5*   GFR: Estimated Creatinine Clearance: 44.4 mL/min (A) (by C-G formula based on SCr of 1.06 mg/dL (H)). Liver Function Tests: Recent Labs  Lab 03/22/20 1648 03/28/20 2340 03/29/20 0402  AST 20 26 25   ALT 15 18 20   ALKPHOS 99 79 84  BILITOT 0.8 0.6 0.7  PROT 7.9 6.4* 6.7  ALBUMIN 3.9 3.0* 3.1*   No results for input(s): LIPASE, AMYLASE in the last 168 hours. No results for input(s): AMMONIA in the last 168 hours. Coagulation Profile: Recent Labs  Lab 03/28/20 1521  INR 1.0   Cardiac Enzymes: Recent Labs  Lab 03/22/20 1648  CKTOTAL 301*   BNP (last 3 results) No results for input(s): PROBNP in the last 8760 hours. HbA1C: No  results for input(s): HGBA1C in the last 72 hours. CBG: Recent Labs  Lab 03/28/20 2040  GLUCAP 179*   Lipid Profile: Recent Labs    03/28/20 1523  TRIG 116   Thyroid Function Tests: No results for input(s): TSH, T4TOTAL, FREET4, T3FREE, THYROIDAB in the last 72 hours. Anemia Panel: Recent Labs    03/28/20 1523  FERRITIN 84   Sepsis Labs: Recent Labs  Lab 03/28/20 1522 03/28/20 1523 03/28/20 2128  PROCALCITON  --  <0.10  --   LATICACIDVEN 1.3  --  1.4    Recent Results (from the past 240 hour(s))  Urine Culture     Status: Abnormal   Collection Time: 03/27/20  4:14 PM   Specimen: Urine, Random  Result Value Ref Range Status   Specimen Description   Final    URINE, RANDOM Performed at [facility], [address], [place], [place] [address]    Special Requests   Final    NONE Performed at [facility], [address] [place], [place] [address]    Culture MULTIPLE SPECIES PRESENT, SUGGEST RECOLLECTION (A)  Final   Report Status 03/29/2020 FINAL  Final  SARS CORONAVIRUS 2 (TAT 6-24 HRS) Nasopharyngeal Nasopharyngeal Swab     Status: Abnormal   Collection Time: 03/28/20  2:59 AM   Specimen: Nasopharyngeal Swab  Result Value Ref Range Status   SARS Coronavirus 2 POSITIVE (A) NEGATIVE Final    Comment: (NOTE) SARS-CoV-2 target nucleic acids are DETECTED.  The SARS-CoV-2 RNA is generally detectable in upper and lower respiratory specimens during the acute phase of infection. Positive results are indicative of the presence of SARS-CoV-2 RNA. Clinical correlation with patient history and other diagnostic information is  necessary to determine patient infection status. Positive results do not rule out bacterial infection or co-infection with other viruses.  The expected result is Negative.  Fact Sheet for Patients: SugarRoll.be  Fact Sheet for Healthcare  Providers: https://www.woods-mathews.com/  This test is not yet approved or cleared by the [place] FDA and  has been authorized for detection and/or diagnosis of  SARS-CoV-2 by FDA under an Emergency Use Authorization (EUA). This EUA will remain  in effect (meaning this test can be used) for the duration of the COVID-19 declaration under Section 564(b)(1) of the Act, 21 U. S.C. section 360bbb-3(b)(1), unless the authorization is terminated or revoked sooner.   Performed at [facility], [place] [address], [place], [place] [address]   Blood Culture (routine x 2)     Status: None (Preliminary result)   Collection Time: 03/28/20  4:06 PM   Specimen: BLOOD  Result Value Ref Range Status   Specimen Description BLOOD BLOOD LEFT WRIST  Final   Special Requests   Final    BOTTLES DRAWN AEROBIC AND ANAEROBIC Blood Culture adequate volume   Culture   Final    NO GROWTH < 24 HOURS Performed at [facility], [address], [place], [place] [address]    Report Status PENDING  Incomplete  Blood Culture (routine x 2)     Status: None (Preliminary result)   Collection Time: 03/28/20  4:06 PM   Specimen: BLOOD  Result Value Ref Range Status   Specimen Description BLOOD LEFT ANTECUBITAL  Final   Special Requests   Final    BOTTLES DRAWN AEROBIC AND ANAEROBIC Blood Culture adequate volume   Culture   Final    NO GROWTH < 24 HOURS Performed at [facility], [address], [place], [place] [address]    Report Status PENDING  Incomplete      Imaging Studies   DG Chest 1 View  Result Date: 03/28/2020 CLINICAL DATA:  Possible sepsis, COVID positive EXAM: CHEST  1 VIEW COMPARISON:  2015 FINDINGS: Patchy interstitial opacities at the lung bases. No pleural effusion. No pneumothorax. Cardiomediastinal contours are within normal limits for technique. IMPRESSION: Patchy interstitial opacities at the lung bases, which may reflect COVID pneumonia.  Electronically Signed   By: Macy Mis M.D.   On: 03/28/2020 15:00   CT Head Wo Contrast  Result Date: 03/28/2020 CLINICAL DATA:  Chronic antiplatelet therapy, multiple falls, head injury, remote CVA EXAM: CT HEAD WITHOUT CONTRAST TECHNIQUE: Contiguous axial images were obtained from the base of the skull through the vertex without intravenous contrast. COMPARISON:  03/22/2020 FINDINGS: Brain: Large right remote MCA territory infarct again noted. Remote lacunar infarcts noted within the a left insular cortex and left putamen. Moderate parenchymal volume loss is commensurate with the patient's age. Moderate periventricular white matter changes are present likely reflecting the sequela of small vessel ischemia. No evidence of acute intracranial hemorrhage or infarct. No abnormal mass effect or midline shift. No abnormal intra or extra-axial mass lesion or fluid collection. Ventricular size is normal. Cerebellum is unremarkable. Vascular: No asymmetric hyperdense vasculature noted at the skull base. Skull: Intact Sinuses/Orbits: Paranasal sinuses are clear. The orbits are unremarkable. Other: Mastoid air cells and middle ear cavities are clear. IMPRESSION: Stable remote infarcts including large right MCA territory infarct. No evidence of acute intracranial hemorrhage or infarct. No calvarial fracture. Electronically Signed   By: Fidela Salisbury MD   On: 03/28/2020 03:33     Medications   Scheduled Meds: . aspirin EC  81 mg Oral Daily  . atorvastatin  80 mg Oral Daily  . clopidogrel  75 mg Oral Daily  . enoxaparin (LOVENOX) injection  40 mg Subcutaneous Q24H  . ezetimibe  10 mg Oral Daily  . gabapentin  100 mg Oral TID  . lisinopril  10 mg Oral Daily  . metFORMIN  500  mg Oral Q breakfast  . methylPREDNISolone (SOLU-MEDROL) injection  0.5 mg/kg Intravenous Q12H   Followed by  . [START ON 04/01/2020] predniSONE  50 mg Oral Daily  . nitrofurantoin (macrocrystal-monohydrate)  100 mg Oral Q12H    Continuous Infusions: . remdesivir 100 mg in NS 100 mL 100 mg (03/29/20 1050)       LOS: 0 days    Time spent: 30 minutes    Ezekiel Slocumb, DO [facility]  03/29/2020, 4:45 PM    If 7PM-7AM, please contact night-coverage. How to contact the [facility] Attending or Consulting provider Melissa or covering provider during after hours [place], for this patient?    1. Check the care team in [facility] and look for a) attending/consulting TRH provider listed and b) the [facility] team listed 2. Log into [email or web address] and use [facility]'s universal password to access. If you do not have the password, please contact the hospital operator. 3. Locate the [facility] provider you are looking for under [facility] and page to a number that you can be directly reached. 4. If you still have difficulty reaching the provider, please page the [facility] (Director on Call) for the Hospitalists listed on amion for assistance.

## 2020-03-29 NOTE — Progress Notes (Signed)
PHARMACY NOTE:  ANTIMICROBIAL RENAL DOSAGE ADJUSTMENT  Current antimicrobial regimen includes a mismatch between antimicrobial dosage and estimated renal function.  As per policy approved by the Pharmacy & Therapeutics and Medical Executive Committees, the antimicrobial dosage will be adjusted accordingly.  Current antimicrobial dosage: Levofloxacin 500mg  Q24h  Indication: empiric coverage for possible UTI  Renal Function:  Estimated Creatinine Clearance: 44.4 mL/min (A) (by C-G formula based on SCr of 1.06 mg/dL (H)).    Antimicrobial dosage has been changed to:  Levofloxacin 250mg  Q24H   Thank you for allowing pharmacy to be a part of this patient's care.  Sherilyn Banker, PharmD Pharmacy Resident  03/29/2020 5:02 PM

## 2020-03-29 NOTE — Evaluation (Signed)
Occupational Therapy Evaluation Patient Details Name: Erin Hamilton MRN: [id number] DOB: [birth year] Today's Date: 03/29/2020    History of Present Illness Per MD notes: Pt is a 70 y.o. female with medical history significant for dementia, failure to thrive, prior history of stroke in September 2019, hyperlipidemia, diabetes mellitus, hypertension, presented to EMS from home for chief concerns of weakness and frequent falling.  MD assessment includes: Acute hypoxemic respiratory failure due to COVID-19, debility, general weakness, and HTN.   Clinical Impression   Pt seen for OT evaluation this date in setting of acute hospitalization with COVID-19. Pt with h/o CVA impacting her self care and mobility at baseline. Pt reports to this [place]ia that she lives alone, but per chart review, pt lives with her daughter in [facility] with 4 STE and requires some assist at baseline, but unclear the extent of ADL assist. This date on assessment, pt requiring MIN/MOD A for seated UB ADLs including bathing and dressing and MOD/MAX A for seated LB  ADLs d/t poor dyanmic sitting balance. Pt requires MIN/MOD A from elebvated surface with RW for STS transfer. Pt demos F static sitting balance with UE support and P dynamic sitting balance. Pt requires MAX A with RW and posterior support/manual assist from OT to extend hips into full stand to clear her bottom from the bed. Pt requires increased time and multimodal cues throughout all aspects of assessment and treatment. Will continue to follow acutely and anticipate pt will require f/u STR at SNF.     Follow Up Recommendations  SNF    Equipment Recommendations  3 in 1 bedside commode;Tub/shower seat;Other (comment) (2ww)    Recommendations for Other Services       Precautions / Restrictions Precautions Precautions: Fall Restrictions Weight Bearing Restrictions: No      Mobility Bed Mobility Overal bed mobility: Needs Assistance Bed Mobility: Supine to Sit;Sit to  Supine     Supine to sit: Max assist;HOB elevated Sit to supine: Max assist;HOB elevated   General bed mobility comments: Max A for BLE and trunk control    Transfers Overall transfer level: Needs assistance Equipment used: Rolling walker (2 wheeled) Transfers: Sit to/from Stand Sit to Stand: Max assist;From elevated surface         General transfer comment: Pt requires increased time and posterior support to extend hips to full stand. MAX verbal/tactile cues for safe use of RW and bed height elevated ~2-3" higher than normal hospital bed height (she is in low bed, so this is an estimation).    Balance Overall balance assessment: Needs assistance Sitting-balance support: Feet supported;Single extremity supported Sitting balance-[name] Scale: Fair Sitting balance - Comments: requires R UE support to sustain static sitting, Pt with POOR dynamic sitting, losing balance to her L or posteriorly with attempts to weight shift in sitting. Postural control: Posterior lean;Left lateral lean   Standing balance-[name] Scale: Zero Standing balance comment: MAX A to sustain static stand and b/l UE support on RW.                           ADL either performed or assessed with clinical judgement   ADL                                         General ADL Comments: Pt requires MIN/MOD A for seated UB ADLs including bathing  and dressing and MOD/MAX A for seated LB  ADLs d/t poor dyanmic sitting balance. Pt requires MIN/MOD A from elebvated surface with RW for STS transfer.     Vision Patient Visual Report: No change from baseline Additional Comments: difficult to formally assess d/t cognition, pt mostly tracking appropriately with OT session, but is noted to have distant stare intermittently.     Perception     Praxis      Pertinent Vitals/Pain Pain Assessment: No/denies pain     Hand Dominance     Extremity/Trunk Assessment Upper Extremity  Assessment Upper Extremity Assessment: Generalized weakness;LUE deficits/detail;Difficult to assess due to impaired cognition LUE Deficits / Details: Chronic LUE weakness from CVA   Lower Extremity Assessment Lower Extremity Assessment: Defer to PT evaluation;LLE deficits/detail;Difficult to assess due to impaired cognition LLE Deficits / Details: Chronic LLE weakness from CVA       Communication Communication Communication: No difficulties   Cognition Arousal/Alertness: Awake/alert Behavior During Therapy: Flat affect Overall Cognitive Status: No family/caregiver present to determine baseline cognitive functioning                                 General Comments: Pt unable to provide history; was able to follow 1-step commands with multi-modal cuing 50-75% of the time. Perseverative on remote and her blanket from home. Pt oriented to self only.   General Comments       Exercises Other Exercises Other Exercises: OT educates re: role of OT. Pt perseverative and reception of education difficult to guage. Pt engaged in UB/LB bathing and dressing during OT session. Other Exercises: Seated BLE LAQs with A/AAROM 2 x 10 Other Exercises: Static and dynamic sitting at EOB for core strengthening, improved activity tolerance,  and static sitting balance   Shoulder Instructions      Home Living Family/patient expects to be discharged to:: Private residence Living Arrangements: Children Available Help at Discharge: Family;Available 24 hours/day Type of Home: House Home Access: Stairs to enter [employer] of Steps: 4 Entrance Stairs-Rails: Right;Left Home Layout: One level                   Additional Comments: Per PT note: History from pt's daughter via phone call secondary to pt being a poor historian; dtr unsure of what AD's the pt owns, pt lives with other daughter who was unable to be reached via phone call      Prior Functioning/Environment Level  of Independence: Needs assistance  Gait / Transfers Assistance Needed: Per daughter: pt does ambulate in the home but unsure what device she uses ADL's / Homemaking Assistance Needed: Daughter unsure of extent of assist the pt requires            OT Problem List: Decreased strength;Decreased range of motion;Decreased activity tolerance;Impaired balance (sitting and/or standing);Decreased coordination;Decreased cognition;Decreased safety awareness;Decreased knowledge of use of DME or AE;Impaired tone;Impaired UE functional use      OT Treatment/Interventions: Self-care/ADL training;DME and/or AE instruction;Therapeutic activities;Balance training;Therapeutic exercise;Energy conservation;Patient/family education;Neuromuscular education    OT Goals(Current goals can be found in the care plan section) Acute Rehab OT Goals Patient Stated Goal: none stated OT Goal Formulation: Patient unable to participate in goal setting Time For Goal Achievement: 04/12/20 Potential to Achieve Goals: Fair ADL Goals Pt Will Perform Grooming: with set-up;sitting (with G-/F+ static sitting balance to improve sitting fxl activity tolerance) Pt Will Perform Upper Body Dressing: with min assist;sitting (using  hemi-dressing technique with <20% verbal/tactile cues.)  OT Frequency: Min 1X/week   Barriers to D/C:            Co-evaluation              AM-PAC OT "6 Clicks" Daily Activity     Outcome Measure Help from another person eating meals?: A Little Help from another person taking care of personal grooming?: A Little Help from another person toileting, which includes using toliet, bedpan, or urinal?: A Lot Help from another person bathing (including washing, rinsing, drying)?: A Lot Help from another person to put on and taking off regular upper body clothing?: A Lot Help from another person to put on and taking off regular lower body clothing?: A Lot 6 Click Score: 14   End of Session Equipment  Utilized During Treatment: Gait belt;Rolling walker Nurse Communication: Mobility status  Activity Tolerance: Patient tolerated treatment well Patient left: in bed;with call bell/phone within reach;with bed alarm set  OT Visit Diagnosis: Unsteadiness on feet (R26.81);Muscle weakness (generalized) (M62.81);Other symptoms and signs involving the nervous system (R29.898);Other symptoms and signs involving cognitive function                Time: 7846-9629 OT Time Calculation (min): 48 min Charges:  OT General Charges $OT Visit: 1 Visit OT Evaluation $OT Eval Moderate Complexity: 1 Mod OT Treatments $Self Care/Home Management : 23-37 mins $Therapeutic Activity: 8-22 mins  Gerrianne Scale, MS, OTR/L ascom [phone] 03/29/20, 4:05 PM

## 2020-03-29 NOTE — Evaluation (Signed)
Physical Therapy Evaluation Patient Details Name: Erin Hamilton MRN: [id number] DOB: 02-01-1951 Today's Date: 03/29/2020   History of Present Illness  Per MD notes: Pt is a 70 y.o. female with medical history significant for dementia, failure to thrive, prior history of stroke in September 2019, hyperlipidemia, diabetes mellitus, hypertension, presented to EMS from home for chief concerns of weakness and frequent falling.  MD assessment includes: Acute hypoxemic respiratory failure due to COVID-19, debility, general weakness, and HTN.    Clinical Impression  Pt confused and perseverated on various topics during the session but was able to be redirected and to follow most 1-step commands with extra time and cuing.  Pt presented with significant functional weakness and was unable to come to standing even from an elevated EOB with max A.  Pt found on room air with SpO2 in the mid 90s throughout the session and HR WNL.  Pt will benefit from PT services in a SNF setting upon discharge to safely address deficits listed in patient problem list for decreased caregiver assistance and eventual return to PLOF.     Follow Up Recommendations SNF;Supervision for mobility/OOB    Equipment Recommendations  Other (comment) (TBD at next venue of care)    Recommendations for Other Services       Precautions / Restrictions Precautions Precautions: Fall Restrictions Weight Bearing Restrictions: No      Mobility  Bed Mobility Overal bed mobility: Needs Assistance Bed Mobility: Supine to Sit;Sit to Supine     Supine to sit: Max assist Sit to supine: Max assist   General bed mobility comments: Max A for BLE and trunk control    Transfers                 General transfer comment: Pt made multiple attempts to stand from an elevated EOB with mod and then max A but was unable to clear the surface of the bed  Ambulation/Gait             General Gait Details: Unable  Stairs             Wheelchair Mobility    Modified Rankin (Stroke Patients Only)       Balance Overall balance assessment: Needs assistance Sitting-balance support: Feet supported Sitting balance-[name] Scale: Fair         Standing balance comment: Unable to stand                             Pertinent Vitals/Pain Pain Assessment: No/denies pain    Home Living Family/patient expects to be discharged to:: Private residence Living Arrangements: Children Available Help at Discharge: Family;Available 24 hours/day Type of Home: House Home Access: Stairs to enter Entrance Stairs-Rails: Psychiatric nurse of Steps: 4 Home Layout: One level   Additional Comments: History from pt's daughter via phone call secondary to pt being a poor historian; dtr unsure of what AD's the pt owns, pt lives with other daughter who was unable to be reached via phone call    Prior Function Level of Independence: Needs assistance   Gait / Transfers Assistance Needed: Per daughter pt does ambulate in the home but unsure what device she uses  ADL's / Homemaking Assistance Needed: Daughter unsure of extent of assist the pt requires        Hand Dominance        Extremity/Trunk Assessment   Upper Extremity Assessment Upper Extremity Assessment: Generalized weakness;LUE deficits/detail;Difficult to  assess due to impaired cognition LUE Deficits / Details: Chronic LUE weakness from CVA    Lower Extremity Assessment Lower Extremity Assessment: Difficult to assess due to impaired cognition;Generalized weakness;LLE deficits/detail LLE Deficits / Details: Chronic LLE weakness from CVA       Communication   Communication: No difficulties  Cognition Arousal/Alertness: Awake/alert Behavior During Therapy: WFL for tasks assessed/performed Overall Cognitive Status: No family/caregiver present to determine baseline cognitive functioning                                  General Comments: Pt unable to provide history; was able to follow 1-step commands with multi-modal cuing 50-75% of the time      General Comments      Exercises Other Exercises Other Exercises: General supine BLE ankle, knee, and hip AA/PROM in available planes as pt would allow Other Exercises: Seated BLE LAQs with A/AAROM 2 x 10 Other Exercises: Static and dynamic sitting at EOB for core strengthening, improved activity tolerance,  and static sitting balance   Assessment/Plan    PT Assessment Patient needs continued PT services  PT Problem List Decreased strength;Decreased activity tolerance;Decreased balance;Decreased mobility       PT Treatment Interventions DME instruction;Gait training;Stair training;Functional mobility training;Therapeutic activities;Therapeutic exercise;Balance training;Patient/family education    PT Goals (Current goals can be found in the Care Plan section)  Acute Rehab PT Goals PT Goal Formulation: Patient unable to participate in goal setting Time For Goal Achievement: 04/11/20 Potential to Achieve Goals: Fair    Frequency Min 2X/week   Barriers to discharge Inaccessible home environment;Decreased caregiver support      Co-evaluation               AM-PAC PT "6 Clicks" Mobility  Outcome Measure Help needed turning from your back to your side while in a flat bed without using bedrails?: A Lot Help needed moving from lying on your back to sitting on the side of a flat bed without using bedrails?: A Lot Help needed moving to and from a bed to a chair (including a wheelchair)?: Total Help needed standing up from a chair using your arms (e.g., wheelchair or bedside chair)?: Total Help needed to walk in hospital room?: Total Help needed climbing 3-5 steps with a railing? : Total 6 Click Score: 8    End of Session Equipment Utilized During Treatment: Gait belt;Oxygen Activity Tolerance: Patient tolerated treatment well Patient left: in  bed;with call bell/phone within reach;with bed alarm set Nurse Communication: Mobility status PT Visit Diagnosis: History of falling (Z91.81);Difficulty in walking, not elsewhere classified (R26.2);Muscle weakness (generalized) (M62.81)    Time: 0258-5277 PT Time Calculation (min) (ACUTE ONLY): 46 min   Charges:   PT Evaluation $PT Eval Moderate Complexity: 1 Mod PT Treatments $Therapeutic Exercise: 8-22 mins $Therapeutic Activity: 8-22 mins        D. Scott Tanner PT, DPT 03/29/20, 1:34 PM

## 2020-03-29 NOTE — Hospital Course (Signed)
70 y.o. female with medical history significant for dementia, failure to thrive, prior history of stroke in September 2019 with residual right-sided weakness, hyperlipidemia, diabetes mellitus, hypertension, presented to the ED on 03/28/2020 via EMS from home for chief concerns of weakness and frequent falls.  Daughter reported patient having difficulty walking for about 5 days.    Evaluation in the ED revealed hypoxia with O2 sat of 86 to 89% on room air, requiring 2 L/min supplemental oxygen.  COVID-19 PCR was positive.  Chest x-ray showed patchy interstitial opacities in both lung bases reflecting multifocal COVID-19 pneumonia.  Admitted to hospitalist service for further management of acute respiratory failure with hypoxia secondary to COVID-19 pneumonia.  Started on remdesivir and Solu-Medrol in addition to supportive care.

## 2020-03-29 NOTE — Progress Notes (Signed)
Pt confused, disoriented to time and situation. Forgetful and ask same questions repeatedly. Reoriented.  Incontinent care provided.  Pt assisted/fed with dinner, ate apple sauce and drank apple juice and tea. Call bell placed within reach. Bed alarm activated.

## 2020-03-29 NOTE — NC FL2 (Signed)
[place] LEVEL OF CARE SCREENING TOOL     IDENTIFICATION  Patient Name: Erin Hamilton Birthdate: October 29, 1950 Sex: female Admission Date (Current Location): 03/28/2020  [place] and [place] Number:  Engineering geologist and Address:  [facility], [address] [place], [place] [address]      Provider Number: [id number]  Attending Physician Name and Address:  Ezekiel Slocumb, DO  Relative Name and Phone Number:  Arley Garant (daughter) [phone]    Current Level of Care: Hospital Recommended Level of Care: [place] Prior Approval Number:    Date Approved/Denied:   PASRR Number: pending  Discharge Plan: SNF    Current Diagnoses: Patient Active Problem List   Diagnosis Date Noted  . Acute hypoxemic respiratory failure due to COVID-19 ([place]) 03/28/2020  . UTI (urinary tract infection) 11/06/2017  . Hemiparesis affecting left side as late effect of cerebrovascular accident ([place]) 06/24/2016  . Bilateral carotid artery stenosis   . H/O carotid endarterectomy   . Cognitive dysfunction due to acute cerebrovascular accident (CVA) ([place]) 06/23/2016  . H/O: stroke 11/19/2015  . Health care maintenance 11/19/2015  . Diabetes mellitus without complication ([place]) 37/06/8887  . Essential hypertension, benign 10/10/2014  . Back pain with left-sided sciatica 10/10/2014  . Acute anxiety 10/09/2014  . Stroke ([place]) 10/09/2014  . Hyperlipidemia 10/09/2014  . Depression 10/09/2014  . Hypertensive CKD (chronic kidney disease) 10/09/2014  . Type 2 diabetes mellitus with hyperglycemia, without long-term current use of insulin ([place]) 09/22/2009  . ACUTE SINUSITIS, UNSPECIFIED 09/22/2009    Orientation RESPIRATION BLADDER Height & Weight     Self  O2 ([place] 2L) Incontinent Weight: 64 kg Height:  5\' 5"  (165.1 cm)  BEHAVIORAL SYMPTOMS/MOOD NEUROLOGICAL BOWEL NUTRITION STATUS      Incontinent Diet (Heart Healthy)  AMBULATORY STATUS  COMMUNICATION OF NEEDS Skin   Extensive Assist Verbally Normal                       Personal Care Assistance Level of Assistance  Bathing,Feeding,Dressing Bathing Assistance: Maximum assistance Feeding assistance: Limited assistance Dressing Assistance: Maximum assistance     Functional Limitations Info             SPECIAL CARE FACTORS FREQUENCY  PT (By licensed PT),OT (By licensed OT)     PT Frequency: 5 times per week OT Frequency: 5 times per week            Contractures Contractures Info: Not present    Additional Factors Info  Code Status,Allergies Code Status Info: Partial Code no CPR Allergies Info: Oysters, PCN, latex           Current Medications (03/29/2020):  This is the current hospital active medication list Current Facility-Administered Medications  Medication Dose Route Frequency Provider Last Rate Last Admin  . acetaminophen (TYLENOL) tablet 325 mg  325 mg Oral Q6H PRN Cox, Amy N, DO   325 mg at 03/28/20 2131  . aspirin EC tablet 81 mg  81 mg Oral Daily Cox, Amy N, DO   81 mg at 03/29/20 1037  . atorvastatin (LIPITOR) tablet 80 mg  80 mg Oral Daily Cox, Amy N, DO   80 mg at 03/29/20 1037  . clopidogrel (PLAVIX) tablet 75 mg  75 mg Oral Daily Cox, Amy N, DO   75 mg at 03/29/20 1038  . enoxaparin (LOVENOX) injection 40 mg  40 mg Subcutaneous Q24H Cox, Amy N, DO   40 mg at  03/28/20 2135  . ezetimibe (ZETIA) tablet 10 mg  10 mg Oral Daily Cox, Amy N, DO   10 mg at 03/28/20 0948  . gabapentin (NEURONTIN) capsule 100 mg  100 mg Oral TID Cox, Amy N, DO   100 mg at 03/29/20 1037  . lisinopril (ZESTRIL) tablet 10 mg  10 mg Oral Daily Cox, Amy N, DO   10 mg at 03/29/20 1037  . metFORMIN (GLUCOPHAGE) tablet 500 mg  500 mg Oral Q breakfast Cox, Amy N, DO   500 mg at 03/29/20 1036  . methylPREDNISolone sodium succinate (SOLU-MEDROL) 40 mg/mL injection 32 mg  0.5 mg/kg Intravenous Q12H Cox, Amy N, DO   32 mg at 03/29/20 1037   Followed by  . [START ON  04/01/2020] predniSONE (DELTASONE) tablet 50 mg  50 mg Oral Daily Cox, Amy N, DO      . nitrofurantoin (macrocrystal-monohydrate) (MACROBID) capsule 100 mg  100 mg Oral Q12H Cox, Amy N, DO   100 mg at 03/29/20 1037  . ondansetron (ZOFRAN) tablet 4 mg  4 mg Oral Q6H PRN Cox, Amy N, DO       Or  . ondansetron (ZOFRAN) injection 4 mg  4 mg Intravenous Q6H PRN Cox, Amy N, DO      . remdesivir 100 mg in sodium chloride 0.9 % 100 mL IVPB  100 mg Intravenous Daily Cox, Amy N, DO 200 mL/hr at 03/29/20 1050 100 mg at 03/29/20 1050     Discharge Medications: Please see discharge summary for a list of discharge medications.  Relevant Imaging Results:  Relevant Lab Results:   Additional Information SS# [id number]  Shelbie Hutching, RN

## 2020-03-29 NOTE — Progress Notes (Signed)
Pt remains wide awake, unable to sleep. Pt requested for sleeping pill.  MD on call notified. Ordered melatonin. Melatonin administered at 2246. Fluids offered. Incontinent care provided. Call bell placed within reach. Bed alarm activated.

## 2020-03-29 NOTE — Progress Notes (Signed)
To whom it may concern: Please be advised that above named patient will require a short term nursing home stay-anticipated 30 days or less for rehabilitation and strengthening.  The plan is for return home.

## 2020-03-29 NOTE — Progress Notes (Signed)
Pt removed 2 oxygen sensors so far. Pts oxygen saturation is 94% on room air. CCMD notified.

## 2020-03-30 DIAGNOSIS — J1282 Pneumonia due to coronavirus disease 2019: Secondary | ICD-10-CM | POA: Diagnosis not present

## 2020-03-30 DIAGNOSIS — R7881 Bacteremia: Secondary | ICD-10-CM | POA: Diagnosis present

## 2020-03-30 DIAGNOSIS — U071 COVID-19: Secondary | ICD-10-CM | POA: Diagnosis not present

## 2020-03-30 DIAGNOSIS — J9601 Acute respiratory failure with hypoxia: Secondary | ICD-10-CM | POA: Diagnosis not present

## 2020-03-30 DIAGNOSIS — G9341 Metabolic encephalopathy: Secondary | ICD-10-CM | POA: Diagnosis not present

## 2020-03-30 LAB — BLOOD CULTURE ID PANEL (REFLEXED) - BCID2

## 2020-03-30 LAB — CBC WITH DIFFERENTIAL/PLATELET
Abs Immature Granulocytes: 0.03 10*3/uL (ref 0.00–0.07)
Basophils Absolute: 0 10*3/uL (ref 0.0–0.1)
Basophils Relative: 0 %
Eosinophils Absolute: 0 10*3/uL (ref 0.0–0.5)
Eosinophils Relative: 0 %
HCT: 39.2 % (ref 36.0–46.0)
Hemoglobin: 13.2 g/dL (ref 12.0–15.0)
Immature Granulocytes: 1 %
Lymphocytes Relative: 30 %
Lymphs Abs: 1.1 10*3/uL (ref 0.7–4.0)
MCH: 30.6 pg (ref 26.0–34.0)
MCHC: 33.7 g/dL (ref 30.0–36.0)
MCV: 90.7 fL (ref 80.0–100.0)
Monocytes Absolute: 0.3 10*3/uL (ref 0.1–1.0)
Monocytes Relative: 8 %
Neutro Abs: 2.1 10*3/uL (ref 1.7–7.7)
Neutrophils Relative %: 61 %
Platelets: 211 10*3/uL (ref 150–400)
RBC: 4.32 MIL/uL (ref 3.87–5.11)
RDW: 13.2 % (ref 11.5–15.5)
WBC: 3.5 10*3/uL — ABNORMAL LOW (ref 4.0–10.5)
nRBC: 0 % (ref 0.0–0.2)

## 2020-03-30 LAB — COMPREHENSIVE METABOLIC PANEL
ALT: 18 U/L (ref 0–44)
AST: 25 U/L (ref 15–41)
Albumin: 2.9 g/dL — ABNORMAL LOW (ref 3.5–5.0)
Alkaline Phosphatase: 75 U/L (ref 38–126)
Anion gap: 8 (ref 5–15)
BUN: 29 mg/dL — ABNORMAL HIGH (ref 8–23)
CO2: 27 mmol/L (ref 22–32)
Calcium: 8.6 mg/dL — ABNORMAL LOW (ref 8.9–10.3)
Chloride: 106 mmol/L (ref 98–111)
Creatinine, Ser: 1.08 mg/dL — ABNORMAL HIGH (ref 0.44–1.00)
GFR, Estimated: 55 mL/min — ABNORMAL LOW (ref >60)
Glucose, Bld: 252 mg/dL — ABNORMAL HIGH (ref 70–99)
Potassium: 4.3 mmol/L (ref 3.5–5.1)
Sodium: 141 mmol/L (ref 135–145)
Total Bilirubin: 0.5 mg/dL (ref 0.3–1.2)
Total Protein: 6.2 g/dL — ABNORMAL LOW (ref 6.5–8.1)

## 2020-03-30 LAB — GLUCOSE, CAPILLARY
Glucose-Capillary: 195 mg/dL — ABNORMAL HIGH (ref 70–99)
Glucose-Capillary: 211 mg/dL — ABNORMAL HIGH (ref 70–99)
Glucose-Capillary: 180 mg/dL — ABNORMAL HIGH (ref 70–99)
Glucose-Capillary: 200 mg/dL — ABNORMAL HIGH (ref 70–99)

## 2020-03-30 LAB — C-REACTIVE PROTEIN: CRP: 0.6 mg/dL (ref <1.0)

## 2020-03-30 LAB — FIBRIN DERIVATIVES D-DIMER (ARMC ONLY): Fibrin derivatives D-dimer (ARMC): 1583.59 ng/mL (FEU) — ABNORMAL HIGH (ref 0.00–499.00)

## 2020-03-30 LAB — HEMOGLOBIN A1C
Hgb A1c MFr Bld: 8.1 % — ABNORMAL HIGH (ref 4.8–5.6)
Mean Plasma Glucose: 185.77 mg/dL

## 2020-03-30 MED ORDER — PREDNISONE 20 MG PO TABS
30.0000 mg | ORAL_TABLET | Freq: Every day | ORAL | Status: DC
  Administered 2020-03-31 – 2020-04-01 (×2): 30 mg via ORAL
  Filled 2020-03-30 (×2): qty 1

## 2020-03-30 MED ORDER — QUETIAPINE FUMARATE 25 MG PO TABS
25.0000 mg | ORAL_TABLET | Freq: Every day | ORAL | Status: DC
  Administered 2020-03-30 – 2020-04-08 (×10): 25 mg via ORAL
  Filled 2020-03-30 (×10): qty 1

## 2020-03-30 NOTE — Progress Notes (Signed)
Update given to daughter.  

## 2020-03-30 NOTE — Progress Notes (Addendum)
PROGRESS NOTE    Erin Hamilton   ZOX:[id number]  DOB: 03/10/1950  PCP: Lauro RegulusAnderson, Marshall W, MD    DOA: 03/28/2020 LOS: 1   Brief Narrative / Hospital Course to-date   70 y.o. female with medical history significant for dementia, failure to thrive, prior history of stroke in September 2019 with residual right-sided weakness, hyperlipidemia, diabetes mellitus, hypertension, presented to the ED on 03/28/2020 via EMS from home for chief concerns of weakness and frequent falls.  Daughter reported patient having difficulty walking for about 5 days.    Evaluation in the ED revealed hypoxia with O2 sat of 86 to 89% on room air, requiring 2 L/min supplemental oxygen.  COVID-19 PCR was positive.  Chest x-ray showed patchy interstitial opacities in both lung bases reflecting multifocal COVID-19 pneumonia.  Admitted to hospitalist service for further management of acute respiratory failure with hypoxia secondary to COVID-19 pneumonia.  Started on remdesivir and Solu-Medrol in addition to supportive care.    Significant Events: - admitted 03/28/20  Date of +Covid Test: 03/28/20 in ED  Vaccination status: unvaccinated  Assessment & Plan   Principal Problem:   Pneumonia due to COVID-19 virus Active Problems:   UTI (urinary tract infection)   Acute hypoxemic respiratory failure due to COVID-19 (HCC)   Positive blood culture   Type 2 diabetes mellitus with hyperglycemia, without long-term current use of insulin (HCC)   Essential hypertension, benign   Hyperlipidemia   Depression   Cognitive dysfunction due to acute cerebrovascular accident (CVA) (HCC)   Hemiparesis affecting left side as late effect of cerebrovascular accident (HCC)   H/O carotid endarterectomy   H/O: stroke   Acute respiratory failure with hypoxia secondary to multifocal COVID-19 pneumonia with superimposed bacterial infection -present on admission. --Continue remdesivir  --Levaquin, concern for secondary bacterial  PNA --Change steroid Solu-Medrol >> Prednisone 3 days then stop --Antitussives, bronchodilators --Continue antibiotics for possible superimposed bacterial infection --Incentive spirometry and flutter valve --Follow inflammatory markers, CBC, CMP daily --O2 to maintain O2 sat 88 to 93% at rest, wean as tolerated  General debility, generalized weakness, failure to thrive -TOC consulted.  PT and OT have evaluated and recommend SNF at discharge.    Abnormal UA, Probable UTI -initial urine culture grew multiple species.  Repeat urine culture is pending, follow-up.  Patient did have suprapubic tenderness.  On empiric Levaquin (as above for PNA).  Follow up pending culture.  Positive blood culture - initial Bcx with 1 (aerobic) of 4 bottles growing G+ cocci - follow up.  Pt on Levaquin as above.  Collect repeat Bcx.    Acute metabolic encephalopathy - appears to have some hospital delirium / sundowning per overnight report by nursing of confusion, disoriented, difficult to reorient.  No new focal neurologic deficits. Her baseline cognitive status not entirely clear but has some baseline impairment. --trial low dose Seroquel qHS ----Delirium precautions:     -Lights and TV off, minimize interruptions at night    -Blinds open and lights on during day    -Glasses/hearing aid with patient    -Frequent reorientation    -PT/OT when able    -Avoid sedation medications as much as possible   Hypertension -continue home lisinopril  Non-insulin-dependent type 2 diabetes -hold Metformin in case of need for contrast CT in patient with Covid at high risk for pulmonary embolism.  Cover with sliding scale NovoLog for now.  Anticipate steroid-induced hyperglycemia.  Hyperlipidemia -continue home Lipitor  History of stroke, left MCA -with residual right-sided  weakness.  Continue home Plavix, aspirin, Lipitor  Peripheral neuropathy -most likely secondary to diabetes.  Continue home gabapentin   DVT  prophylaxis: enoxaparin (LOVENOX) injection 40 mg Start: 03/28/20 2200 Place TED hose Start: 03/28/20 1529   Diet:  Diet Orders (From admission, onward)    Start     Ordered   03/28/20 1526  Diet Heart Room service appropriate? Yes; Fluid consistency: Thin  Diet effective now       Question Answer Comment  Room service appropriate? Yes   Fluid consistency: Thin      03/28/20 1526            Code Status: Partial Code    Subjective 03/30/20    Patient sleeping comfortably but wakes to voice and light touch on the shoulder.  Says she feels fair.  Feels rested.  Denies SOB, CP, abdominal pain, N/V.  Per nursing staff, pt was very confused overnight, wanting to get up from bed, screaming at times.  Pt calm this morning  Disposition Plan & Communication   Status is: Inpatient  Remains inpatient appropriate because:Inpatient level of care appropriate due to severity of illness, on IV therapies as above for COVID-19 pneumonia and probable UTI.   Dispo: The patient is from: Home              Anticipated d/c is to: SNF              Anticipated d/c date is: 2 days              Patient currently is not medically stable to d/c.   Difficult to place patient No   Family Communication: None at bedside during encounter will attempt to call   Consults, Procedures, Treatments   Consultants:   None  Procedures:   None  Covid-specific treatments:  Antimicrobials:  Anti-infectives (From admission, onward)   Start     Dose/Rate Route Frequency Ordered Stop   03/29/20 1900  Levofloxacin (LEVAQUIN) IVPB 250 mg        250 mg 50 mL/hr over 60 Minutes Intravenous Every 24 hours 03/29/20 1652     03/29/20 1000  remdesivir 100 mg in sodium chloride 0.9 % 100 mL IVPB       "Followed by" Linked Group Details   100 mg 200 mL/hr over 30 Minutes Intravenous Daily 03/28/20 1512 04/02/20 0959   03/29/20 1000  remdesivir 100 mg in sodium chloride 0.9 % 100 mL IVPB  Status:  Discontinued        "Followed by" Linked Group Details   100 mg 200 mL/hr over 30 Minutes Intravenous Daily 03/28/20 1530 03/28/20 1531   03/28/20 1545  remdesivir 200 mg in sodium chloride 0.9% 250 mL IVPB  Status:  Discontinued       "Followed by" Linked Group Details   200 mg 580 mL/hr over 30 Minutes Intravenous Once 03/28/20 1530 03/28/20 1531   03/28/20 1515  remdesivir 200 mg in sodium chloride 0.9% 250 mL IVPB       "Followed by" Linked Group Details   200 mg 580 mL/hr over 30 Minutes Intravenous Once 03/28/20 1512 03/28/20 1905   03/28/20 1415  levofloxacin (LEVAQUIN) IVPB 750 mg        750 mg 100 mL/hr over 90 Minutes Intravenous  Once 03/28/20 1407 03/28/20 1808   03/28/20 1000  nitrofurantoin (macrocrystal-monohydrate) (MACROBID) capsule 100 mg  Status:  Discontinued        100 mg Oral Every 12 hours 03/28/20  0441 03/29/20 1652   03/28/20 0230  nitrofurantoin (macrocrystal-monohydrate) (MACROBID) capsule 100 mg        100 mg Oral  Once 03/28/20 0229 03/28/20 0255        Objective   Vitals:   03/30/20 0054 03/30/20 0521 03/30/20 0755 03/30/20 1100  BP: (!) 148/73 (!) 144/67 137/79 133/76  Pulse: 68 60 67 65  Resp: 16 16 16 16   Temp: 97.9 F (36.6 C) 97.9 F (36.6 C) 98 F (36.7 C) 98.1 F (36.7 C)  TempSrc: Oral Oral    SpO2: 100% 97% 97% 97%  Weight:      Height:        Intake/Output Summary (Last 24 hours) at 03/30/2020 1621 Last data filed at 03/30/2020 0641 Gross per 24 hour  Intake 240 ml  Output 300 ml  Net -60 ml   Filed Weights   03/27/20 1319 03/28/20 2033  Weight: 64 kg 64 kg    Physical Exam:  General exam: awake, alert, no acute distress Respiratory system: Decreased breath sounds, normal respiratory effort, on room air. Cardiovascular system:  RRR, no pedal edema Gastrointestinal system: soft and non-tender, no distention. Central nervous system: no gross focal neurologic deficits, normal speech  Labs   Data Reviewed: I have personally reviewed  following labs and imaging studies  CBC: Recent Labs  Lab 03/27/20 1323 03/28/20 1521 03/29/20 0402 03/30/20 0445  WBC 4.2 4.8 2.4* 3.5*  NEUTROABS  --  3.0 1.2* 2.1  HGB 13.7 13.7 13.6 13.2  HCT 41.9 41.8 41.1 39.2  MCV 92.3 92.7 91.5 90.7  PLT 203 186 187 [id number]   Basic Metabolic Panel: Recent Labs  Lab 03/27/20 1323 03/28/20 2340 03/29/20 0402 03/30/20 0445  NA 137 138 143 141  K 3.4* 3.3* 3.6 4.3  CL 99 102 104 106  CO2 25 24 26 27   GLUCOSE 214* 225* 182* 252*  BUN 18 23 22  29*  CREATININE 0.98 1.14* 1.06* 1.08*  CALCIUM 8.6* 7.9* 8.5* 8.6*   GFR: Estimated Creatinine Clearance: 43.6 mL/min (A) (by C-G formula based on SCr of 1.08 mg/dL (H)). Liver Function Tests: Recent Labs  Lab 03/28/20 2340 03/29/20 0402 03/30/20 0445  AST 26 25 25   ALT 18 20 18   ALKPHOS 79 84 75  BILITOT 0.6 0.7 0.5  PROT 6.4* 6.7 6.2*  ALBUMIN 3.0* 3.1* 2.9*   No results for input(s): LIPASE, AMYLASE in the last 168 hours. No results for input(s): AMMONIA in the last 168 hours. Coagulation Profile: Recent Labs  Lab 03/28/20 1521  INR 1.0   Cardiac Enzymes: No results for input(s): CKTOTAL, CKMB, CKMBINDEX, TROPONINI in the last 168 hours. BNP (last 3 results) No results for input(s): PROBNP in the last 8760 hours. HbA1C: Recent Labs    03/29/20 0402  HGBA1C 8.1*   CBG: Recent Labs  Lab 03/28/20 2040 03/29/20 2008 03/30/20 0754 03/30/20 1158 03/30/20 1605  GLUCAP 179* 220* 195* 211* 180*   Lipid Profile: Recent Labs    03/28/20 1523  TRIG 116   Thyroid Function Tests: No results for input(s): TSH, T4TOTAL, FREET4, T3FREE, THYROIDAB in the last 72 hours. Anemia Panel: Recent Labs    03/28/20 1523  FERRITIN 84   Sepsis Labs: Recent Labs  Lab 03/28/20 1522 03/28/20 1523 03/28/20 2128  PROCALCITON  --  <0.10  --   LATICACIDVEN 1.3  --  1.4    Recent Results (from the past 240 hour(s))  Urine Culture     Status: Abnormal  Collection Time: 03/27/20   4:14 PM   Specimen: Urine, Random  Result Value Ref Range Status   Specimen Description   Final    URINE, RANDOM Performed at [facility], [address], [place], [place] [address]    Special Requests   Final    NONE Performed at [facility], [address] [place], [place] [address]    Culture MULTIPLE SPECIES PRESENT, SUGGEST RECOLLECTION (A)  Final   Report Status 03/29/2020 FINAL  Final  SARS CORONAVIRUS 2 (TAT 6-24 HRS) Nasopharyngeal Nasopharyngeal Swab     Status: Abnormal   Collection Time: 03/28/20  2:59 AM   Specimen: Nasopharyngeal Swab  Result Value Ref Range Status   SARS Coronavirus 2 POSITIVE (A) NEGATIVE Final    Comment: (NOTE) SARS-CoV-2 target nucleic acids are DETECTED.  The SARS-CoV-2 RNA is generally detectable in upper and lower respiratory specimens during the acute phase of infection. Positive results are indicative of the presence of SARS-CoV-2 RNA. Clinical correlation with patient history and other diagnostic information is  necessary to determine patient infection status. Positive results do not rule out bacterial infection or co-infection with other viruses.  The expected result is Negative.  Fact Sheet for Patients: SugarRoll.be  Fact Sheet for Healthcare Providers: https://www.woods-mathews.com/  This test is not yet approved or cleared by the [place] FDA and  has been authorized for detection and/or diagnosis of SARS-CoV-2 by FDA under an Emergency Use Authorization (EUA). This EUA will remain  in effect (meaning this test can be used) for the duration of the COVID-19 declaration under Section 564(b)(1) of the Act, 21 U. S.C. section 360bbb-3(b)(1), unless the authorization is terminated or revoked sooner.   Performed at [facility], [place] [address] [place], [place] [address]   Blood Culture (routine x 2)     Status: None (Preliminary result)   Collection Time:  03/28/20  4:06 PM   Specimen: BLOOD  Result Value Ref Range Status   Specimen Description BLOOD BLOOD LEFT WRIST  Final   Special Requests   Final    BOTTLES DRAWN AEROBIC AND ANAEROBIC Blood Culture adequate volume   Culture   Final    NO GROWTH 2 DAYS Performed at [facility], [address], [place], [place] [address]    Report Status PENDING  Incomplete  Blood Culture (routine x 2)     Status: None (Preliminary result)   Collection Time: 03/28/20  4:06 PM   Specimen: BLOOD  Result Value Ref Range Status   Specimen Description BLOOD LEFT ANTECUBITAL  Final   Special Requests   Final    BOTTLES DRAWN AEROBIC AND ANAEROBIC Blood Culture adequate volume   Culture  Setup Time   Final    Organism ID to follow GRAM POSITIVE COCCI AEROBIC BOTTLE ONLY CRITICAL RESULT CALLED TO, READ BACK BY AND VERIFIED WITH: NATHAN BELUE AT [place] 03/30/20. MF Performed at [facility], [address] [place], [place] [address]    Culture [facility] POSITIVE COCCI  Final   Report Status PENDING  Incomplete  Blood Culture ID Panel (Reflexed)     Status: Abnormal   Collection Time: 03/28/20  4:06 PM  Result Value Ref Range Status   Enterococcus faecalis NOT DETECTED NOT DETECTED Final   Enterococcus Faecium NOT DETECTED NOT DETECTED Final   Listeria monocytogenes NOT DETECTED NOT DETECTED Final   Staphylococcus species DETECTED (A) NOT DETECTED Final    Comment: CRITICAL RESULT CALLED TO, READ BACK  BY AND VERIFIED WITH: NATHAN BELUE AT [place] 03/30/20. MF    Staphylococcus aureus (BCID) NOT DETECTED NOT DETECTED Final   Staphylococcus epidermidis NOT DETECTED NOT DETECTED Final   Staphylococcus lugdunensis NOT DETECTED NOT DETECTED Final   Streptococcus species NOT DETECTED NOT DETECTED Final   Streptococcus agalactiae NOT DETECTED NOT DETECTED Final   Streptococcus pneumoniae NOT DETECTED NOT DETECTED Final   Streptococcus pyogenes NOT DETECTED NOT DETECTED Final    A.calcoaceticus-baumannii NOT DETECTED NOT DETECTED Final   Bacteroides fragilis NOT DETECTED NOT DETECTED Final   Enterobacterales NOT DETECTED NOT DETECTED Final   Enterobacter cloacae complex NOT DETECTED NOT DETECTED Final   Escherichia coli NOT DETECTED NOT DETECTED Final   Klebsiella aerogenes NOT DETECTED NOT DETECTED Final   Klebsiella oxytoca NOT DETECTED NOT DETECTED Final   Klebsiella pneumoniae NOT DETECTED NOT DETECTED Final   Proteus species NOT DETECTED NOT DETECTED Final   Salmonella species NOT DETECTED NOT DETECTED Final   Serratia marcescens NOT DETECTED NOT DETECTED Final   Haemophilus influenzae NOT DETECTED NOT DETECTED Final   Neisseria meningitidis NOT DETECTED NOT DETECTED Final   Pseudomonas aeruginosa NOT DETECTED NOT DETECTED Final   Stenotrophomonas maltophilia NOT DETECTED NOT DETECTED Final   Candida albicans NOT DETECTED NOT DETECTED Final   Candida auris NOT DETECTED NOT DETECTED Final   Candida glabrata NOT DETECTED NOT DETECTED Final   Candida krusei NOT DETECTED NOT DETECTED Final   Candida parapsilosis NOT DETECTED NOT DETECTED Final   Candida tropicalis NOT DETECTED NOT DETECTED Final   Cryptococcus neoformans/gattii NOT DETECTED NOT DETECTED Final    Comment: Performed at [facility], [address], [place], [place] [address]      Imaging Studies   No results found.   Medications   Scheduled Meds: . aspirin EC  81 mg Oral Daily  . atorvastatin  80 mg Oral Daily  . clopidogrel  75 mg Oral Daily  . enoxaparin (LOVENOX) injection  40 mg Subcutaneous Q24H  . ezetimibe  10 mg Oral Daily  . gabapentin  100 mg Oral TID  . insulin aspart  0-9 Units Subcutaneous TID WC  . lisinopril  10 mg Oral Daily  . [START ON 03/31/2020] predniSONE  30 mg Oral Q breakfast  . QUEtiapine  25 mg Oral QHS   Continuous Infusions: . levofloxacin (LEVAQUIN) IV Stopped (03/29/20 2230)  . remdesivir 100 mg in NS 100 mL 100 mg (03/30/20 1119)        LOS: 1 day    Time spent: 25 minutes with > 50% spent at bedside and in coordination of care.    Ezekiel Slocumb, DO [facility]  03/30/2020, 4:21 PM    If 7PM-7AM, please contact night-coverage. How to contact the [facility] Attending or Consulting provider [place] or covering provider during after hours [place], for this patient?    1. Check the care team in [facility] and look for a) attending/consulting [facility] provider listed and b) the [facility] team listed 2. Log into [email or web address] and use [place]'s universal password to access. If you do not have the password, please contact the hospital operator. 3. Locate the [facility] provider you are looking for under [facility] and page to a number that you can be directly reached. 4. If you still have difficulty reaching the provider, please page the [facility] (Director on Call) for the Hospitalists listed on amion for assistance.

## 2020-03-30 NOTE — Progress Notes (Signed)
Pt is very confused, wide awake, screaming and wants to get up. Reoriented multiple times and was unsuccessful. Fall precaution reinforced.

## 2020-03-30 NOTE — Progress Notes (Signed)
PHARMACY - PHYSICIAN COMMUNICATION CRITICAL VALUE ALERT - BLOOD CULTURE IDENTIFICATION (BCID)  BCID Report - 1 (aerobic) of 4 bottles: G+ Cocci Species.  Pt currently on Levaquin 250mg  q24h due to severe PCN allergy.  No change in therapy recommended at this time.  Name of physician (or Provider) Contacted: Dr. Sidney Ace  Changes to prescribed antibiotics required: No changes made at this time by MD.  Renda Rolls, PharmD, [facility] 03/30/2020 2:08 AM

## 2020-03-31 DIAGNOSIS — I639 Cerebral infarction, unspecified: Secondary | ICD-10-CM

## 2020-03-31 DIAGNOSIS — N3000 Acute cystitis without hematuria: Secondary | ICD-10-CM | POA: Diagnosis not present

## 2020-03-31 DIAGNOSIS — U071 COVID-19: Secondary | ICD-10-CM | POA: Diagnosis not present

## 2020-03-31 DIAGNOSIS — R4189 Other symptoms and signs involving cognitive functions and awareness: Secondary | ICD-10-CM

## 2020-03-31 DIAGNOSIS — J1282 Pneumonia due to coronavirus disease 2019: Secondary | ICD-10-CM | POA: Diagnosis not present

## 2020-03-31 LAB — CBC WITH DIFFERENTIAL/PLATELET
Abs Immature Granulocytes: 0.01 10*3/uL (ref 0.00–0.07)
Basophils Absolute: 0 10*3/uL (ref 0.0–0.1)
Basophils Relative: 0 %
Eosinophils Absolute: 0 10*3/uL (ref 0.0–0.5)
Eosinophils Relative: 0 %
HCT: 39.5 % (ref 36.0–46.0)
Hemoglobin: 13.3 g/dL (ref 12.0–15.0)
Immature Granulocytes: 0 %
Lymphocytes Relative: 32 %
Lymphs Abs: 1.3 10*3/uL (ref 0.7–4.0)
MCH: 30.9 pg (ref 26.0–34.0)
MCHC: 33.7 g/dL (ref 30.0–36.0)
MCV: 91.6 fL (ref 80.0–100.0)
Monocytes Absolute: 0.3 10*3/uL (ref 0.1–1.0)
Monocytes Relative: 8 %
Neutro Abs: 2.5 10*3/uL (ref 1.7–7.7)
Neutrophils Relative %: 60 %
Platelets: 209 10*3/uL (ref 150–400)
RBC: 4.31 MIL/uL (ref 3.87–5.11)
RDW: 13.3 % (ref 11.5–15.5)
WBC: 4.1 10*3/uL (ref 4.0–10.5)
nRBC: 0 % (ref 0.0–0.2)

## 2020-03-31 LAB — GLUCOSE, CAPILLARY
Glucose-Capillary: 167 mg/dL — ABNORMAL HIGH (ref 70–99)
Glucose-Capillary: 133 mg/dL — ABNORMAL HIGH (ref 70–99)
Glucose-Capillary: 234 mg/dL — ABNORMAL HIGH (ref 70–99)
Glucose-Capillary: 216 mg/dL — ABNORMAL HIGH (ref 70–99)

## 2020-03-31 LAB — COMPREHENSIVE METABOLIC PANEL
ALT: 17 U/L (ref 0–44)
AST: 24 U/L (ref 15–41)
Albumin: 2.8 g/dL — ABNORMAL LOW (ref 3.5–5.0)
Alkaline Phosphatase: 80 U/L (ref 38–126)
Anion gap: 12 (ref 5–15)
BUN: 29 mg/dL — ABNORMAL HIGH (ref 8–23)
CO2: 25 mmol/L (ref 22–32)
Calcium: 8.5 mg/dL — ABNORMAL LOW (ref 8.9–10.3)
Chloride: 103 mmol/L (ref 98–111)
Creatinine, Ser: 1.08 mg/dL — ABNORMAL HIGH (ref 0.44–1.00)
GFR, Estimated: 55 mL/min — ABNORMAL LOW (ref >60)
Glucose, Bld: 239 mg/dL — ABNORMAL HIGH (ref 70–99)
Potassium: 3.9 mmol/L (ref 3.5–5.1)
Sodium: 140 mmol/L (ref 135–145)
Total Bilirubin: 0.6 mg/dL (ref 0.3–1.2)
Total Protein: 6.3 g/dL — ABNORMAL LOW (ref 6.5–8.1)

## 2020-03-31 LAB — MAGNESIUM: Magnesium: 2.1 mg/dL (ref 1.7–2.4)

## 2020-03-31 NOTE — Progress Notes (Signed)
PROGRESS NOTE    Erin Hamilton   VPX:[id number]  DOB: 1950-10-27  PCP: Kirk Ruths, MD    DOA: 03/28/2020 LOS: 2   Brief Narrative / Hospital Course to-date   70 y.o. female with medical history significant for dementia, failure to thrive, prior history of stroke in September 2019 with residual right-sided weakness, hyperlipidemia, diabetes mellitus, hypertension, presented to the ED on 03/28/2020 via EMS from home for chief concerns of weakness and frequent falls.  Daughter reported patient having difficulty walking for about 5 days.    Evaluation in the ED revealed hypoxia with O2 sat of 86 to 89% on room air, requiring 2 L/min supplemental oxygen.  COVID-19 PCR was positive.  Chest x-ray showed patchy interstitial opacities in both lung bases reflecting multifocal COVID-19 pneumonia.  Admitted to hospitalist service for further management of acute respiratory failure with hypoxia secondary to COVID-19 pneumonia.  Started on remdesivir and Solu-Medrol in addition to supportive care.    Significant Events: - admitted 03/28/20  Date of +Covid Test: 03/28/20 in ED  Vaccination status: unvaccinated  Assessment & Plan   Principal Problem:   Pneumonia due to COVID-19 virus Active Problems:   UTI (urinary tract infection)   Acute hypoxemic respiratory failure due to COVID-19 (HCC)   Positive blood culture   Acute metabolic encephalopathy   Type 2 diabetes mellitus with hyperglycemia, without long-term current use of insulin (HCC)   Essential hypertension, benign   Hyperlipidemia   Depression   Cognitive dysfunction due to acute cerebrovascular accident (CVA) ([place])   Hemiparesis affecting left side as late effect of cerebrovascular accident ([place])   H/O carotid endarterectomy   H/O: stroke   Acute respiratory failure with hypoxia secondary to multifocal COVID-19 pneumonia with superimposed bacterial infection -present on admission. --Continue remdesivir  --Levaquin,  concern for secondary bacterial PNA (1/28 >>) --Change steroid Solu-Medrol >> Prednisone 3 days then stop --Antitussives, bronchodilators --Incentive spirometry and flutter valve --Follow inflammatory markers, CBC, CMP daily --O2 to maintain O2 sat 88 to 93% at rest, wean as tolerated  General debility, generalized weakness, failure to thrive -TOC consulted.  PT and OT have evaluated and recommend SNF at discharge.  Placement is pending.  Abnormal UA, Probable UTI -initial urine culture grew multiple species.  Repeat urine culture is pending, follow-up.  Patient did have suprapubic tenderness.  On empiric Levaquin (as above for PNA).  Follow up pending culture.  Positive blood culture - initial Bcx with 1 (aerobic) of 4 bottles growing G+ cocci - follow up.  Pt on Levaquin as above.  Repeat Bcx neg to date - follow.  Acute metabolic encephalopathy - appears to have some hospital delirium / sundowning per overnight report by nursing of confusion, disoriented, difficult to reorient.  No new focal neurologic deficits. Her baseline cognitive status not entirely clear but has some baseline impairment. --trial low dose Seroquel qHS ----Delirium precautions:     -Lights and TV off, minimize interruptions at night    -Blinds open and lights on during day    -Glasses/hearing aid with patient    -Frequent reorientation    -PT/OT when able    -Avoid sedation medications as much as possible  Hypertension -continue home lisinopril  Non-insulin-dependent type 2 diabetes -hold Metformin in case of need for contrast CT in patient with Covid at high risk for pulmonary embolism.  Cover with sliding scale NovoLog for now.  Anticipate steroid-induced hyperglycemia.  Hyperlipidemia -continue home Lipitor  History of stroke, left MCA -  with residual right-sided weakness.  Continue home Plavix, aspirin, Lipitor  Peripheral neuropathy -most likely secondary to diabetes.  Continue home gabapentin   DVT  prophylaxis: enoxaparin (LOVENOX) injection 40 mg Start: 03/28/20 2200 Place TED hose Start: 03/28/20 1529   Diet:  Diet Orders (From admission, onward)    Start     Ordered   03/28/20 1526  Diet Heart Room service appropriate? Yes; Fluid consistency: Thin  Diet effective now       Question Answer Comment  Room service appropriate? Yes   Fluid consistency: Thin      03/28/20 1526            Code Status: Partial Code    Subjective 03/31/20    Patient resting but awake when seen today. Says she had a great breakfast.  Feels okay.  Says still mild cough at times.  Requesting ice water.  Disposition Plan & Communication   Status is: Inpatient  Remains inpatient appropriate because:Inpatient level of care appropriate due to severity of illness, on IV therapies as above for COVID-19 pneumonia and probable UTI.   Dispo: The patient is from: Home              Anticipated d/c is to: SNF              Anticipated d/c date is: 1 day              Patient currently is not medically stable to d/c.   Difficult to place patient No   Family Communication: None at bedside during encounter will attempt to call   Consults, Procedures, Treatments   Consultants:   None  Procedures:   None  Covid-specific treatments:  Antimicrobials:  Anti-infectives (From admission, onward)   Start     Dose/Rate Route Frequency Ordered Stop   03/29/20 1900  Levofloxacin (LEVAQUIN) IVPB 250 mg        250 mg 50 mL/hr over 60 Minutes Intravenous Every 24 hours 03/29/20 1652 04/03/20 1859   03/29/20 1000  remdesivir 100 mg in sodium chloride 0.9 % 100 mL IVPB       "Followed by" Linked Group Details   100 mg 200 mL/hr over 30 Minutes Intravenous Daily 03/28/20 1512 04/02/20 0959   03/29/20 1000  remdesivir 100 mg in sodium chloride 0.9 % 100 mL IVPB  Status:  Discontinued       "Followed by" Linked Group Details   100 mg 200 mL/hr over 30 Minutes Intravenous Daily 03/28/20 1530 03/28/20 1531    03/28/20 1545  remdesivir 200 mg in sodium chloride 0.9% 250 mL IVPB  Status:  Discontinued       "Followed by" Linked Group Details   200 mg 580 mL/hr over 30 Minutes Intravenous Once 03/28/20 1530 03/28/20 1531   03/28/20 1515  remdesivir 200 mg in sodium chloride 0.9% 250 mL IVPB       "Followed by" Linked Group Details   200 mg 580 mL/hr over 30 Minutes Intravenous Once 03/28/20 1512 03/28/20 1905   03/28/20 1415  levofloxacin (LEVAQUIN) IVPB 750 mg        750 mg 100 mL/hr over 90 Minutes Intravenous  Once 03/28/20 1407 03/28/20 1808   03/28/20 1000  nitrofurantoin (macrocrystal-monohydrate) (MACROBID) capsule 100 mg  Status:  Discontinued        100 mg Oral Every 12 hours 03/28/20 0441 03/29/20 1652   03/28/20 0230  nitrofurantoin (macrocrystal-monohydrate) (MACROBID) capsule 100 mg  100 mg Oral  Once 03/28/20 0229 03/28/20 0255        Objective   Vitals:   03/30/20 2025 03/31/20 0059 03/31/20 0540 03/31/20 0740  BP: 127/61 126/75 115/86 93/72  Pulse: 69 78 68 71  Resp: 18 16 16 20   Temp: 98.6 F (37 C) 98.6 F (37 C) 98 F (36.7 C) 97.6 F (36.4 C)  TempSrc: Oral Oral Oral   SpO2: (!) 78% 93% 94% (!) 89%  Weight:      Height:       No intake or output data in the 24 hours ending 03/31/20 1254 Filed Weights   03/27/20 1319 03/28/20 2033  Weight: 64 kg 64 kg    Physical Exam:  General exam: awake, alert, no acute distress Respiratory system: CTAB, normal respiratory effort, on room air. Cardiovascular system:  RRR, no peripheral edema Gastrointestinal system: soft, NT, ND. Central nervous system: no gross focal neurologic deficits, normal speech   Labs   Data Reviewed: I have personally reviewed following labs and imaging studies  CBC: Recent Labs  Lab 03/27/20 1323 03/28/20 1521 03/29/20 0402 03/30/20 0445 03/31/20 0457  WBC 4.2 4.8 2.4* 3.5* 4.1  NEUTROABS  --  3.0 1.2* 2.1 2.5  HGB 13.7 13.7 13.6 13.2 13.3  HCT 41.9 41.8 41.1 39.2 39.5   MCV 92.3 92.7 91.5 90.7 91.6  PLT 203 186 187 211 [id number]   Basic Metabolic Panel: Recent Labs  Lab 03/27/20 1323 03/28/20 2340 03/29/20 0402 03/30/20 0445 03/31/20 0457  NA 137 138 143 141 140  K 3.4* 3.3* 3.6 4.3 3.9  CL 99 102 104 106 103  CO2 25 24 26 27 25   GLUCOSE 214* 225* 182* 252* 239*  BUN 18 23 22  29* 29*  CREATININE 0.98 1.14* 1.06* 1.08* 1.08*  CALCIUM 8.6* 7.9* 8.5* 8.6* 8.5*  MG  --   --   --   --  2.1   GFR: Estimated Creatinine Clearance: 43.6 mL/min (A) (by C-G formula based on SCr of 1.08 mg/dL (H)). Liver Function Tests: Recent Labs  Lab 03/28/20 2340 03/29/20 0402 03/30/20 0445 03/31/20 0457  AST 26 25 25 24   ALT 18 20 18 17   ALKPHOS 79 84 75 80  BILITOT 0.6 0.7 0.5 0.6  PROT 6.4* 6.7 6.2* 6.3*  ALBUMIN 3.0* 3.1* 2.9* 2.8*   No results for input(s): LIPASE, AMYLASE in the last 168 hours. No results for input(s): AMMONIA in the last 168 hours. Coagulation Profile: Recent Labs  Lab 03/28/20 1521  INR 1.0   Cardiac Enzymes: No results for input(s): CKTOTAL, CKMB, CKMBINDEX, TROPONINI in the last 168 hours. BNP (last 3 results) No results for input(s): PROBNP in the last 8760 hours. HbA1C: Recent Labs    03/29/20 0402  HGBA1C 8.1*   CBG: Recent Labs  Lab 03/30/20 1158 03/30/20 1605 03/30/20 2027 03/31/20 0745 03/31/20 1223  GLUCAP 211* 180* 200* 167* 133*   Lipid Profile: Recent Labs    03/28/20 1523  TRIG 116   Thyroid Function Tests: No results for input(s): TSH, T4TOTAL, FREET4, T3FREE, THYROIDAB in the last 72 hours. Anemia Panel: Recent Labs    03/28/20 1523  FERRITIN 84   Sepsis Labs: Recent Labs  Lab 03/28/20 1522 03/28/20 1523 03/28/20 2128  PROCALCITON  --  <0.10  --   LATICACIDVEN 1.3  --  1.4    Recent Results (from the past 240 hour(s))  Urine Culture     Status: Abnormal   Collection Time: 03/27/20  4:14 PM   Specimen: Urine, Random  Result Value Ref Range Status   Specimen Description   Final     URINE, RANDOM Performed at [facility], [address] [place], [place] [address]    Special Requests   Final    NONE Performed at [facility], [address] [place], [place] [address]    Culture MULTIPLE SPECIES PRESENT, SUGGEST RECOLLECTION (A)  Final   Report Status 03/29/2020 FINAL  Final  SARS CORONAVIRUS 2 (TAT 6-24 HRS) Nasopharyngeal Nasopharyngeal Swab     Status: Abnormal   Collection Time: 03/28/20  2:59 AM   Specimen: Nasopharyngeal Swab  Result Value Ref Range Status   SARS Coronavirus 2 POSITIVE (A) NEGATIVE Final    Comment: (NOTE) SARS-CoV-2 target nucleic acids are DETECTED.  The SARS-CoV-2 RNA is generally detectable in upper and lower respiratory specimens during the acute phase of infection. Positive results are indicative of the presence of SARS-CoV-2 RNA. Clinical correlation with patient history and other diagnostic information is  necessary to determine patient infection status. Positive results do not rule out bacterial infection or co-infection with other viruses.  The expected result is Negative.  Fact Sheet for Patients: SugarRoll.be  Fact Sheet for Healthcare Providers: https://www.woods-mathews.com/  This test is not yet approved or cleared by the [place] FDA and  has been authorized for detection and/or diagnosis of SARS-CoV-2 by FDA under an Emergency Use Authorization (EUA). This EUA will remain  in effect (meaning this test can be used) for the duration of the COVID-19 declaration under Section 564(b)(1) of the Act, 21 U. S.C. section 360bbb-3(b)(1), unless the authorization is terminated or revoked sooner.   Performed at [facility], [place] [address], [place], [place] [address]   Blood Culture (routine x 2)     Status: None (Preliminary result)   Collection Time: 03/28/20  4:06 PM   Specimen: BLOOD  Result Value Ref Range Status   Specimen Description BLOOD  BLOOD LEFT WRIST  Final   Special Requests   Final    BOTTLES DRAWN AEROBIC AND ANAEROBIC Blood Culture adequate volume   Culture   Final    NO GROWTH 3 DAYS Performed at [facility], [address], [place], [place] [address]    Report Status PENDING  Incomplete  Blood Culture (routine x 2)     Status: None (Preliminary result)   Collection Time: 03/28/20  4:06 PM   Specimen: BLOOD  Result Value Ref Range Status   Specimen Description BLOOD LEFT ANTECUBITAL  Final   Special Requests   Final    BOTTLES DRAWN AEROBIC AND ANAEROBIC Blood Culture adequate volume   Culture  Setup Time   Final    Organism ID to follow GRAM POSITIVE COCCI AEROBIC BOTTLE ONLY CRITICAL RESULT CALLED TO, READ BACK BY AND VERIFIED WITH: NATHAN BELUE AT [facility] 03/30/20. MF Performed at [facility], [address] [place], [place] [address]    Culture [facility] POSITIVE COCCI  Final   Report Status PENDING  Incomplete  Blood Culture ID Panel (Reflexed)     Status: Abnormal   Collection Time: 03/28/20  4:06 PM  Result Value Ref Range Status   Enterococcus faecalis NOT DETECTED NOT DETECTED Final   Enterococcus Faecium NOT DETECTED NOT DETECTED Final   Listeria monocytogenes NOT DETECTED NOT DETECTED Final   Staphylococcus species DETECTED (A) NOT DETECTED Final    Comment: CRITICAL RESULT CALLED TO, READ BACK BY AND VERIFIED WITH:  NATHAN BELUE AT 0105 03/30/20. MF    Staphylococcus aureus (BCID) NOT DETECTED NOT DETECTED Final   Staphylococcus epidermidis NOT DETECTED NOT DETECTED Final   Staphylococcus lugdunensis NOT DETECTED NOT DETECTED Final   Streptococcus species NOT DETECTED NOT DETECTED Final   Streptococcus agalactiae NOT DETECTED NOT DETECTED Final   Streptococcus pneumoniae NOT DETECTED NOT DETECTED Final   Streptococcus pyogenes NOT DETECTED NOT DETECTED Final   A.calcoaceticus-baumannii NOT DETECTED NOT DETECTED Final   Bacteroides fragilis NOT DETECTED NOT DETECTED Final    Enterobacterales NOT DETECTED NOT DETECTED Final   Enterobacter cloacae complex NOT DETECTED NOT DETECTED Final   Escherichia coli NOT DETECTED NOT DETECTED Final   Klebsiella aerogenes NOT DETECTED NOT DETECTED Final   Klebsiella oxytoca NOT DETECTED NOT DETECTED Final   Klebsiella pneumoniae NOT DETECTED NOT DETECTED Final   Proteus species NOT DETECTED NOT DETECTED Final   Salmonella species NOT DETECTED NOT DETECTED Final   Serratia marcescens NOT DETECTED NOT DETECTED Final   Haemophilus influenzae NOT DETECTED NOT DETECTED Final   Neisseria meningitidis NOT DETECTED NOT DETECTED Final   Pseudomonas aeruginosa NOT DETECTED NOT DETECTED Final   Stenotrophomonas maltophilia NOT DETECTED NOT DETECTED Final   Candida albicans NOT DETECTED NOT DETECTED Final   Candida auris NOT DETECTED NOT DETECTED Final   Candida glabrata NOT DETECTED NOT DETECTED Final   Candida krusei NOT DETECTED NOT DETECTED Final   Candida parapsilosis NOT DETECTED NOT DETECTED Final   Candida tropicalis NOT DETECTED NOT DETECTED Final   Cryptococcus neoformans/gattii NOT DETECTED NOT DETECTED Final    Comment: Performed at [facility], [address], [place], [place] [address]      Imaging Studies   No results found.   Medications   Scheduled Meds: . aspirin EC  81 mg Oral Daily  . atorvastatin  80 mg Oral Daily  . clopidogrel  75 mg Oral Daily  . enoxaparin (LOVENOX) injection  40 mg Subcutaneous Q24H  . ezetimibe  10 mg Oral Daily  . gabapentin  100 mg Oral TID  . insulin aspart  0-9 Units Subcutaneous TID WC  . lisinopril  10 mg Oral Daily  . predniSONE  30 mg Oral Q breakfast  . QUEtiapine  25 mg Oral QHS   Continuous Infusions: . levofloxacin (LEVAQUIN) IV 250 mg (03/30/20 2000)  . remdesivir 100 mg in NS 100 mL 100 mg (03/31/20 1024)       LOS: 2 days    Time spent: 25 minutes with > 50% spent at bedside and in coordination of care.    Ezekiel Slocumb, DO [facility]  03/31/2020, 12:54 PM    If 7PM-7AM, please contact night-coverage. How to contact the [facility] Attending or Consulting provider [place] or covering provider during after hours [place], for this patient?    1. Check the care team in [facility] and look for a) attending/consulting [facility] provider listed and b) the [facility] team listed 2. Log into [email or web address] and use [place]'s universal password to access. If you do not have the password, please contact the hospital operator. 3. Locate the [facility] provider you are looking for under [facility] and page to a number that you can be directly reached. 4. If you still have difficulty reaching the provider, please page the [facility] (Director on Call) for the Hospitalists listed on amion for assistance.

## 2020-04-01 DIAGNOSIS — I639 Cerebral infarction, unspecified: Secondary | ICD-10-CM | POA: Diagnosis not present

## 2020-04-01 DIAGNOSIS — U071 COVID-19: Secondary | ICD-10-CM | POA: Diagnosis not present

## 2020-04-01 DIAGNOSIS — I10 Essential (primary) hypertension: Secondary | ICD-10-CM | POA: Diagnosis not present

## 2020-04-01 DIAGNOSIS — J1282 Pneumonia due to coronavirus disease 2019: Secondary | ICD-10-CM | POA: Diagnosis not present

## 2020-04-01 LAB — COMPREHENSIVE METABOLIC PANEL
ALT: 20 U/L (ref 0–44)
AST: 28 U/L (ref 15–41)
Albumin: 2.8 g/dL — ABNORMAL LOW (ref 3.5–5.0)
Alkaline Phosphatase: 82 U/L (ref 38–126)
Anion gap: 11 (ref 5–15)
BUN: 23 mg/dL (ref 8–23)
CO2: 24 mmol/L (ref 22–32)
Calcium: 8.3 mg/dL — ABNORMAL LOW (ref 8.9–10.3)
Chloride: 103 mmol/L (ref 98–111)
Creatinine, Ser: 0.87 mg/dL (ref 0.44–1.00)
GFR, Estimated: >60 mL/min (ref >60)
Glucose, Bld: 190 mg/dL — ABNORMAL HIGH (ref 70–99)
Potassium: 3.3 mmol/L — ABNORMAL LOW (ref 3.5–5.1)
Sodium: 138 mmol/L (ref 135–145)
Total Bilirubin: 0.8 mg/dL (ref 0.3–1.2)
Total Protein: 6.2 g/dL — ABNORMAL LOW (ref 6.5–8.1)

## 2020-04-01 LAB — CULTURE, BLOOD (ROUTINE X 2): Special Requests: ADEQUATE

## 2020-04-01 LAB — GLUCOSE, CAPILLARY
Glucose-Capillary: 151 mg/dL — ABNORMAL HIGH (ref 70–99)
Glucose-Capillary: 110 mg/dL — ABNORMAL HIGH (ref 70–99)
Glucose-Capillary: 92 mg/dL (ref 70–99)
Glucose-Capillary: 156 mg/dL — ABNORMAL HIGH (ref 70–99)
Glucose-Capillary: 204 mg/dL — ABNORMAL HIGH (ref 70–99)

## 2020-04-01 MED ORDER — POTASSIUM CHLORIDE CRYS ER 20 MEQ PO TBCR
40.0000 meq | EXTENDED_RELEASE_TABLET | Freq: Once | ORAL | Status: AC
  Administered 2020-04-01: 40 meq via ORAL
  Filled 2020-04-01: qty 2

## 2020-04-01 MED ORDER — LEVOFLOXACIN IN D5W 500 MG/100ML IV SOLN
500.0000 mg | INTRAVENOUS | Status: DC
  Administered 2020-04-01: 500 mg via INTRAVENOUS
  Filled 2020-04-01 (×2): qty 100

## 2020-04-01 MED ORDER — MELATONIN 5 MG PO TABS
5.0000 mg | ORAL_TABLET | Freq: Every day | ORAL | Status: DC
  Administered 2020-04-01 – 2020-04-08 (×8): 5 mg via ORAL
  Filled 2020-04-01 (×11): qty 1

## 2020-04-01 NOTE — Progress Notes (Signed)
PROGRESS NOTE    Erin Hamilton   [id number]  DOB: [birth year]  PCP: Kirk Ruths, MD    DOA: 03/28/2020 LOS: 3   Brief Narrative / Hospital Course to-date   70 y.o. female with medical history significant for dementia, failure to thrive, prior history of stroke in September 2019 with residual right-sided weakness, hyperlipidemia, diabetes mellitus, hypertension, presented to the ED on 03/28/2020 via EMS from home for chief concerns of weakness and frequent falls.  Daughter reported patient having difficulty walking for about 5 days.    Evaluation in the ED revealed hypoxia with O2 sat of 86 to 89% on room air, requiring 2 L/min supplemental oxygen.  COVID-19 PCR was positive.  Chest x-ray showed patchy interstitial opacities in both lung bases reflecting multifocal COVID-19 pneumonia.  Admitted to hospitalist service for further management of acute respiratory failure with hypoxia secondary to COVID-19 pneumonia.  Started on remdesivir and Solu-Medrol in addition to supportive care.    Significant Events: - admitted 03/28/20  Date of +Covid Test: 03/28/20 in ED  Vaccination status: unvaccinated  Assessment & Plan   Principal Problem:   Pneumonia due to COVID-19 virus Active Problems:   UTI (urinary tract infection)   Acute hypoxemic respiratory failure due to COVID-19 (HCC)   Positive blood culture   Acute metabolic encephalopathy   Type 2 diabetes mellitus with hyperglycemia, without long-term current use of insulin (HCC)   Essential hypertension, benign   Hyperlipidemia   Depression   Cognitive dysfunction due to acute cerebrovascular accident (CVA) ([facility])   Hemiparesis affecting left side as late effect of cerebrovascular accident ([facility] [place])   H/O carotid endarterectomy   H/O: stroke   Acute respiratory failure with hypoxia secondary to multifocal COVID-19 pneumonia with superimposed bacterial infection -present on admission. --Continue remdesivir  --Levaquin,  concern for secondary bacterial PNA (1/28 >>) --Initially treated with IV Solu-Medrol >> Prednisone --Stop steroids given no hypoxia past few days --Antitussives, bronchodilators --Incentive spirometry and flutter valve --Follow inflammatory markers, CBC, CMP daily --O2 to maintain O2 sat 88 to 93% at rest, wean as tolerated  General debility, generalized weakness, failure to thrive -TOC consulted.  PT and OT have evaluated and recommend SNF at discharge.  Placement is pending.  Abnormal UA, Probable UTI -initial urine culture grew multiple species.  Repeat urine culture is pending, follow-up.  Patient did have suprapubic tenderness.  On empiric Levaquin (as above for PNA).  Follow up pending culture.  Hypokalemia - replaced.  Follow BMP. Replace as needed.  Positive blood culture - initial Bcx with 1 (aerobic) of 4 bottles growing G+ cocci - follow up.  Pt on Levaquin as above.  Repeat Bcx neg to date - follow.  Acute metabolic encephalopathy - appears to have some hospital delirium / sundowning per overnight report by nursing of confusion, disoriented, difficult to reorient.  No new focal neurologic deficits. Her baseline cognitive status not entirely clear but has some baseline impairment. --trial low dose Seroquel qHS ----Delirium precautions:     -Lights and TV off, minimize interruptions at night    -Blinds open and lights on during day    -Glasses/hearing aid with patient    -Frequent reorientation    -PT/OT when able    -Avoid sedation medications as much as possible  Hypertension -continue home lisinopril  Non-insulin-dependent type 2 diabetes -hold Metformin in case of need for contrast CT in patient with Covid at high risk for pulmonary embolism.  Cover with sliding scale NovoLog for  now.  Anticipate steroid-induced hyperglycemia.  Hyperlipidemia -continue home Lipitor  History of stroke, left MCA -with residual right-sided weakness.  Continue home Plavix, aspirin,  Lipitor  Peripheral neuropathy -most likely secondary to diabetes.  Continue home gabapentin   DVT prophylaxis: enoxaparin (LOVENOX) injection 40 mg Start: 03/28/20 2200 Place TED hose Start: 03/28/20 1529   Diet:  Diet Orders (From admission, onward)    Start     Ordered   03/28/20 1526  Diet Heart Room service appropriate? Yes; Fluid consistency: Thin  Diet effective now       Question Answer Comment  Room service appropriate? Yes   Fluid consistency: Thin      03/28/20 1526            Code Status: Partial Code    Subjective 04/01/20    Patient awake resting in bed today.  Reports feeling okay.  Says "please send me home".  Asks I call her daughter to come pick her up.  We discussed going to rehab, she asked for how long.  She denies acute complaints including F/C, CP, SOB, N/V.  Disposition Plan & Communication   Status is: Inpatient  Remains inpatient appropriate because:Inpatient level of care appropriate due to severity of illness, on IV therapies as above for COVID-19 pneumonia and probable UTI.   Dispo: The patient is from: Home              Anticipated d/c is to: SNF              Anticipated d/c date is: pending SNF bed              Medically stable for d/c: Yes   Difficult to place patient: No   Family Communication: None at bedside during encounter will attempt to call   Consults, Procedures, Treatments   Consultants:   None  Procedures:   None  Covid-specific treatments:  Antimicrobials:  Anti-infectives (From admission, onward)   Start     Dose/Rate Route Frequency Ordered Stop   04/01/20 1900  levofloxacin (LEVAQUIN) IVPB 500 mg        500 mg 100 mL/hr over 60 Minutes Intravenous Every 24 hours 04/01/20 1457 04/03/20 1859   03/29/20 1900  Levofloxacin (LEVAQUIN) IVPB 250 mg  Status:  Discontinued        250 mg 50 mL/hr over 60 Minutes Intravenous Every 24 hours 03/29/20 1652 04/01/20 1457   03/29/20 1000  remdesivir 100 mg in sodium  chloride 0.9 % 100 mL IVPB       "Followed by" Linked Group Details   100 mg 200 mL/hr over 30 Minutes Intravenous Daily 03/28/20 1512 04/01/20 0911   03/29/20 1000  remdesivir 100 mg in sodium chloride 0.9 % 100 mL IVPB  Status:  Discontinued       "Followed by" Linked Group Details   100 mg 200 mL/hr over 30 Minutes Intravenous Daily 03/28/20 1530 03/28/20 1531   03/28/20 1545  remdesivir 200 mg in sodium chloride 0.9% 250 mL IVPB  Status:  Discontinued       "Followed by" Linked Group Details   200 mg 580 mL/hr over 30 Minutes Intravenous Once 03/28/20 1530 03/28/20 1531   03/28/20 1515  remdesivir 200 mg in sodium chloride 0.9% 250 mL IVPB       "Followed by" Linked Group Details   200 mg 580 mL/hr over 30 Minutes Intravenous Once 03/28/20 1512 03/28/20 1905   03/28/20 1415  levofloxacin (LEVAQUIN) IVPB 750 mg  750 mg 100 mL/hr over 90 Minutes Intravenous  Once 03/28/20 1407 03/28/20 1808   03/28/20 1000  nitrofurantoin (macrocrystal-monohydrate) (MACROBID) capsule 100 mg  Status:  Discontinued        100 mg Oral Every 12 hours 03/28/20 0441 03/29/20 1652   03/28/20 0230  nitrofurantoin (macrocrystal-monohydrate) (MACROBID) capsule 100 mg        100 mg Oral  Once 03/28/20 0229 03/28/20 0255        Objective   Vitals:   04/01/20 0007 04/01/20 0537 04/01/20 0806 04/01/20 1140  BP: (!) 153/89 (!) 149/75 (!) 148/93 113/90  Pulse: 88 70 70 78  Resp: 16 16 16 16   Temp: 97.8 F (36.6 C) (!) 97.5 F (36.4 C) 97.8 F (36.6 C) 98.2 F (36.8 C)  TempSrc: Oral Oral Oral Oral  SpO2: 96% 96% 93% 98%  Weight:      Height:        Intake/Output Summary (Last 24 hours) at 04/01/2020 1651 Last data filed at 04/01/2020 0334 Gross per 24 hour  Intake 150 ml  Output -  Net 150 ml   Filed Weights   03/27/20 1319 03/28/20 2033  Weight: 64 kg 64 kg    Physical Exam:  General exam: awake, alert, no acute distress Respiratory system: symmetric chest rise, normal respiratory  effort, on room air, no accessory muscle use. Cardiovascular system:  RRR, no peripheral edema Gastrointestinal system: soft, NT, ND Central nervous system: no gross focal neurologic deficits, normal speech   Labs   Data Reviewed: I have personally reviewed following labs and imaging studies  CBC: Recent Labs  Lab 03/27/20 1323 03/28/20 1521 03/29/20 0402 03/30/20 0445 03/31/20 0457  WBC 4.2 4.8 2.4* 3.5* 4.1  NEUTROABS  --  3.0 1.2* 2.1 2.5  HGB 13.7 13.7 13.6 13.2 13.3  HCT 41.9 41.8 41.1 39.2 39.5  MCV 92.3 92.7 91.5 90.7 91.6  PLT 203 186 187 211 [id number]   Basic Metabolic Panel: Recent Labs  Lab 03/28/20 2340 03/29/20 0402 03/30/20 0445 03/31/20 0457 04/01/20 0430  NA 138 143 141 140 138  K 3.3* 3.6 4.3 3.9 3.3*  CL 102 104 106 103 103  CO2 24 26 27 25 24   GLUCOSE 225* 182* 252* 239* 190*  BUN 23 22 29* 29* 23  CREATININE 1.14* 1.06* 1.08* 1.08* 0.87  CALCIUM 7.9* 8.5* 8.6* 8.5* 8.3*  MG  --   --   --  2.1  --    GFR: Estimated Creatinine Clearance: 54.1 mL/min (by C-G formula based on SCr of 0.87 mg/dL). Liver Function Tests: Recent Labs  Lab 03/28/20 2340 03/29/20 0402 03/30/20 0445 03/31/20 0457 04/01/20 0430  AST 26 25 25 24 28   ALT 18 20 18 17 20   ALKPHOS 79 84 75 80 82  BILITOT 0.6 0.7 0.5 0.6 0.8  PROT 6.4* 6.7 6.2* 6.3* 6.2*  ALBUMIN 3.0* 3.1* 2.9* 2.8* 2.8*   No results for input(s): LIPASE, AMYLASE in the last 168 hours. No results for input(s): AMMONIA in the last 168 hours. Coagulation Profile: Recent Labs  Lab 03/28/20 1521  INR 1.0   Cardiac Enzymes: No results for input(s): CKTOTAL, CKMB, CKMBINDEX, TROPONINI in the last 168 hours. BNP (last 3 results) No results for input(s): PROBNP in the last 8760 hours. HbA1C: No results for input(s): HGBA1C in the last 72 hours. CBG: Recent Labs  Lab 03/31/20 1634 03/31/20 2126 04/01/20 0807 04/01/20 1137 04/01/20 1152  GLUCAP 234* 216* 151* 110* 92   Lipid  Profile: No results for  input(s): CHOL, HDL, LDLCALC, TRIG, CHOLHDL, LDLDIRECT in the last 72 hours. Thyroid Function Tests: No results for input(s): TSH, T4TOTAL, FREET4, T3FREE, THYROIDAB in the last 72 hours. Anemia Panel: No results for input(s): VITAMINB12, FOLATE, FERRITIN, TIBC, IRON, RETICCTPCT in the last 72 hours. Sepsis Labs: Recent Labs  Lab 03/28/20 1522 03/28/20 1523 03/28/20 2128  PROCALCITON  --  <0.10  --   LATICACIDVEN 1.3  --  1.4    Recent Results (from the past 240 hour(s))  Urine Culture     Status: Abnormal   Collection Time: 03/27/20  4:14 PM   Specimen: Urine, Random  Result Value Ref Range Status   Specimen Description   Final    URINE, RANDOM Performed at [facility], [address] [place], [place] [address]    Special Requests   Final    NONE Performed at [facility], [address] [place], [place] [address]    Culture MULTIPLE SPECIES PRESENT, SUGGEST RECOLLECTION (A)  Final   Report Status 03/29/2020 FINAL  Final  SARS CORONAVIRUS 2 (TAT 6-24 HRS) Nasopharyngeal Nasopharyngeal Swab     Status: Abnormal   Collection Time: 03/28/20  2:59 AM   Specimen: Nasopharyngeal Swab  Result Value Ref Range Status   SARS Coronavirus 2 POSITIVE (A) NEGATIVE Final    Comment: (NOTE) SARS-CoV-2 target nucleic acids are DETECTED.  The SARS-CoV-2 RNA is generally detectable in upper and lower respiratory specimens during the acute phase of infection. Positive results are indicative of the presence of SARS-CoV-2 RNA. Clinical correlation with patient history and other diagnostic information is  necessary to determine patient infection status. Positive results do not rule out bacterial infection or co-infection with other viruses.  The expected result is Negative.  Fact Sheet for Patients: SugarRoll.be  Fact Sheet for Healthcare Providers: https://www.woods-mathews.com/  This test is not yet approved or cleared by  the [place] FDA and  has been authorized for detection and/or diagnosis of SARS-CoV-2 by FDA under an Emergency Use Authorization (EUA). This EUA will remain  in effect (meaning this test can be used) for the duration of the COVID-19 declaration under Section 564(b)(1) of the Act, 21 U. S.C. section 360bbb-3(b)(1), unless the authorization is terminated or revoked sooner.   Performed at [facility], [place] [address], [place], [place] [address]   Blood Culture (routine x 2)     Status: None (Preliminary result)   Collection Time: 03/28/20  4:06 PM   Specimen: BLOOD  Result Value Ref Range Status   Specimen Description BLOOD BLOOD LEFT WRIST  Final   Special Requests   Final    BOTTLES DRAWN AEROBIC AND ANAEROBIC Blood Culture adequate volume   Culture   Final    NO GROWTH 4 DAYS Performed at [facility] [address], [place], [place] [address]    Report Status PENDING  Incomplete  Blood Culture (routine x 2)     Status: Abnormal   Collection Time: 03/28/20  4:06 PM   Specimen: BLOOD  Result Value Ref Range Status   Specimen Description   Final    BLOOD LEFT ANTECUBITAL Performed at [facility], [address], [place], [place] [address]    Special Requests   Final    BOTTLES DRAWN AEROBIC AND ANAEROBIC Blood Culture adequate volume Performed at [facility], [address] [place], [place] [address]    Culture  Setup Time   Final  Organism ID to follow GRAM POSITIVE COCCI AEROBIC BOTTLE ONLY CRITICAL RESULT CALLED TO, READ BACK BY AND VERIFIED WITH: NATHAN BELUE AT [place] 03/30/20. MF Performed at [facility], [address] [place], [place] [address]    Culture (A)  Final    STAPHYLOCOCCUS AURICULARIS THE SIGNIFICANCE OF ISOLATING THIS ORGANISM FROM A SINGLE SET OF BLOOD CULTURES WHEN MULTIPLE SETS ARE DRAWN IS UNCERTAIN. PLEASE NOTIFY THE MICROBIOLOGY DEPARTMENT WITHIN ONE WEEK IF SPECIATION AND SENSITIVITIES ARE  REQUIRED. Performed at [facility], [place] [address], [place], [place] [address]    Report Status 04/01/2020 FINAL  Final  Blood Culture ID Panel (Reflexed)     Status: Abnormal   Collection Time: 03/28/20  4:06 PM  Result Value Ref Range Status   Enterococcus faecalis NOT DETECTED NOT DETECTED Final   Enterococcus Faecium NOT DETECTED NOT DETECTED Final   Listeria monocytogenes NOT DETECTED NOT DETECTED Final   Staphylococcus species DETECTED (A) NOT DETECTED Final    Comment: CRITICAL RESULT CALLED TO, READ BACK BY AND VERIFIED WITH: NATHAN BELUE AT 0105 03/30/20. MF    Staphylococcus aureus (BCID) NOT DETECTED NOT DETECTED Final   Staphylococcus epidermidis NOT DETECTED NOT DETECTED Final   Staphylococcus lugdunensis NOT DETECTED NOT DETECTED Final   Streptococcus species NOT DETECTED NOT DETECTED Final   Streptococcus agalactiae NOT DETECTED NOT DETECTED Final   Streptococcus pneumoniae NOT DETECTED NOT DETECTED Final   Streptococcus pyogenes NOT DETECTED NOT DETECTED Final   A.calcoaceticus-baumannii NOT DETECTED NOT DETECTED Final   Bacteroides fragilis NOT DETECTED NOT DETECTED Final   Enterobacterales NOT DETECTED NOT DETECTED Final   Enterobacter cloacae complex NOT DETECTED NOT DETECTED Final   Escherichia coli NOT DETECTED NOT DETECTED Final   Klebsiella aerogenes NOT DETECTED NOT DETECTED Final   Klebsiella oxytoca NOT DETECTED NOT DETECTED Final   Klebsiella pneumoniae NOT DETECTED NOT DETECTED Final   Proteus species NOT DETECTED NOT DETECTED Final   Salmonella species NOT DETECTED NOT DETECTED Final   Serratia marcescens NOT DETECTED NOT DETECTED Final   Haemophilus influenzae NOT DETECTED NOT DETECTED Final   Neisseria meningitidis NOT DETECTED NOT DETECTED Final   Pseudomonas aeruginosa NOT DETECTED NOT DETECTED Final   Stenotrophomonas maltophilia NOT DETECTED NOT DETECTED Final   Candida albicans NOT DETECTED NOT DETECTED Final   Candida auris NOT DETECTED  NOT DETECTED Final   Candida glabrata NOT DETECTED NOT DETECTED Final   Candida krusei NOT DETECTED NOT DETECTED Final   Candida parapsilosis NOT DETECTED NOT DETECTED Final   Candida tropicalis NOT DETECTED NOT DETECTED Final   Cryptococcus neoformans/gattii NOT DETECTED NOT DETECTED Final    Comment: Performed at [facility], [address], [place], [place] [address]      Imaging Studies   No results found.   Medications   Scheduled Meds: . aspirin EC  81 mg Oral Daily  . atorvastatin  80 mg Oral Daily  . clopidogrel  75 mg Oral Daily  . enoxaparin (LOVENOX) injection  40 mg Subcutaneous Q24H  . ezetimibe  10 mg Oral Daily  . gabapentin  100 mg Oral TID  . insulin aspart  0-9 Units Subcutaneous TID WC  . lisinopril  10 mg Oral Daily  . potassium chloride  40 mEq Oral Once  . predniSONE  30 mg Oral Q breakfast  . QUEtiapine  25 mg Oral QHS   Continuous Infusions: . levofloxacin (LEVAQUIN) IV         LOS: 3 days  Time spent: 25 minutes with > 50% spent at bedside and in coordination of care.    Ezekiel Slocumb, DO [facility]  04/01/2020, 4:51 PM    If 7PM-7AM, please contact night-coverage. How to contact the [facility] Attending or Consulting provider [place] or covering provider during after hours [place], for this patient?    1. Check the care team in [facility] and look for a) attending/consulting TR[facility] provider listed and b) the [facility] team listed 2. Log into [email or web address] and use [place]'s universal password to access. If you do not have the password, please contact the hospital operator. 3. Locate the [facility] provider you are looking for under [facility] and page to a number that you can be directly reached. 4. If you still have difficulty reaching the provider, please page the [facility] (Director on Call) for the Hospitalists listed on amion for assistance.

## 2020-04-01 NOTE — Progress Notes (Signed)
PHARMACY NOTE:  ANTIMICROBIAL RENAL DOSAGE ADJUSTMENT  Current antimicrobial regimen includes a mismatch between antimicrobial dosage and estimated renal function.  As per policy approved by the Pharmacy & Therapeutics and Medical Executive Committees, the antimicrobial dosage will be adjusted accordingly.  Current antimicrobial dosage: Levofloxacin 250mg  Q24h  Indication: empiric coverage for possible UTI, PNA  Renal Function: Estimated Creatinine Clearance: 54.1 mL/min (by C-G formula based on SCr of 0.87 mg/dL).    Antimicrobial dosage has been changed to:  Levofloxacin 500mg  Q24H   Thank you for allowing pharmacy to be a part of this patient's care.  Chinita Greenland PharmD Clinical Pharmacist 04/01/2020

## 2020-04-01 NOTE — TOC Progression Note (Signed)
Transition of Care [facility]) - Progression Note    Patient Details  Name: Erin Hamilton MRN: [id number] Date of Birth: August 12, 1950  Transition of Care [facility]) CM/SW [place], LCSW Phone Number: 04/01/2020, 11:15 AM  Clinical Narrative: Left voicemail for daughter [name]. Will discuss SNF recommendation when she calls back. No bed offers at this time. [facility] in [place] declined. [facility] in [place], [facility] in [place] are still pending.  Expected Discharge Plan: [place] Barriers to Discharge: Continued Medical Work up  Expected Discharge Plan and Services Expected Discharge Plan: [place]   Discharge Planning Services: CM Consult Post Acute Care Choice: [place] Living arrangements for the past 2 months: Single Family Home                                       Social Determinants of Health (SDOH) Interventions    Readmission Risk Interventions No flowsheet data found.

## 2020-04-01 NOTE — Progress Notes (Signed)
Occupational Therapy Treatment Patient Details Name: Erin Hamilton MRN: [id number] DOB: 1950/03/14 Today's Date: 04/01/2020    History of present illness Per MD notes: Pt is a 70 y.o. female with medical history significant for dementia, failure to thrive, prior history of stroke in September 2019, hyperlipidemia, diabetes mellitus, hypertension, presented to EMS from home for chief concerns of weakness and frequent falling.  MD assessment includes: Acute hypoxemic respiratory failure due to COVID-19, debility, general weakness, and HTN.   OT comments  Pt seen for OT treatment on this date. Upon arrival to room pt awake, supine in bed, with continuous pulse ox and [name] doffed; continuous pulse ox replaced and [name] donned. Pt agreeable to session, but perseverative on Dr Arbutus Ped and nurse button. Pt frequently removed continuous pulse ox t/o session; RN made aware. Pt able to participated in bed mobility with MAX A and MAX multi-model cues for sequencing. While sitting EOB, pt able to engage in grooming ADLs, with no LOB observed. This session, pt also able to perform x3 sit>stand transfers, obtain upright posture, and walk 3-4 shuffled steps forwards/backwards with MIN A for steadying and RW management. Further mobility deferred d/t pt's poor safety awareness and inconsistency with following 1-step commands. Pt making good progress toward goals. Pt continues to benefit from skilled OT services to maximize return to PLOF and minimize risk of future falls, injury, caregiver burden, and readmission. Will continue to follow POC. Discharge recommendation remains appropriate.    Follow Up Recommendations  SNF    Equipment Recommendations  3 in 1 bedside commode;Tub/shower seat;Other (comment) (2ww)       Precautions / Restrictions Precautions Precautions: Fall Restrictions Weight Bearing Restrictions: No       Mobility Bed Mobility Overal bed mobility: Needs Assistance Bed Mobility: Supine to  Sit;Sit to Supine     Supine to sit: Max assist;HOB elevated Sit to supine: Max assist;HOB elevated   General bed mobility comments: Max A for BLE and trunk control; required MAX multi-model cues for sequencing bed mobility  Transfers Overall transfer level: Needs assistance Equipment used: Rolling walker (2 wheeled) Transfers: Sit to/from Stand Sit to Stand: Min assist         General transfer comment: Pt able to perform x3 sit>stand transfers, obtain upright posture, and walk 3-4 shuffled steps forwards/backwards with MIN A for steadying and RW management. Further mobility deferred d/t pt's poor safety awareness and inconsistency with following 1-step commands    Balance Overall balance assessment: Needs assistance Sitting-balance support: Feet supported;Single extremity supported Sitting balance-[name] Scale: Fair Sitting balance - Comments: Able to engage in unilateral UE grooming tasks while sitting EOB; no LOB observed     Standing balance-[name] Scale: Poor Standing balance comment: Able to take 3-4 shuffled steps forwards/backwards with RW, requiring MIN A for steadying and RW management                           ADL either performed or assessed with clinical judgement   ADL Overall ADL's : Needs assistance/impaired     Grooming: Wash/dry face;Brushing hair;Set up;Supervision/safety;Sitting Grooming Details (indicate cue type and reason): Sitting EOB             Lower Body Dressing: Moderate assistance;Bed level Lower Body Dressing Details (indicate cue type and reason): to don/doff socks             Functional mobility during ADLs: Minimal assistance;Rolling walker General ADL Comments: MIN A for 3-4  shuffled steps forwards/backwards with RW               Cognition Arousal/Alertness: Awake/alert Behavior During Therapy: Flat affect;Impulsive Overall Cognitive Status: No family/caregiver present to determine baseline cognitive  functioning                                 General Comments: A&Ox2. Pt inconsistently following 1-step commands, requiring multi-modal cuing 50-75% of the time. Pt agreeable to session, but perseverative on Dr Ar[place]us Ped and nurse button. Pt frequently removing continuous pulse ox t/o session; RN informed.                   Pertinent Vitals/ Pain       Pain Assessment: No/denies pain         Frequency  Min 1X/week        Progress Toward Goals  OT Goals(current goals can now be found in the care plan section)  Progress towards OT goals: Progressing toward goals  Acute Rehab OT Goals Patient Stated Goal: none stated OT Goal Formulation: Patient unable to participate in goal setting Time For Goal Achievement: 04/12/20 Potential to Achieve Goals: [name] Discharge plan remains appropriate;Frequency remains appropriate       AM-PAC OT "6 Clicks" Daily Activity     Outcome Measure   Help from another person eating meals?: A Little Help from another person taking care of personal grooming?: A Little Help from another person toileting, which includes using toliet, bedpan, or urinal?: A Lot Help from another person bathing (including washing, rinsing, drying)?: A Lot Help from another person to put on and taking off regular upper body clothing?: A Lot Help from another person to put on and taking off regular lower body clothing?: A Lot 6 Click Score: 14    End of Session Equipment Utilized During Treatment: Gait belt;Rolling walker  OT Visit Diagnosis: Unsteadiness on feet (R26.81);Muscle weakness (generalized) (M62.81);Other symptoms and signs involving the nervous system (R29.898);Other symptoms and signs involving cognitive function   Activity Tolerance Patient tolerated treatment well   Patient Left in bed;with call bell/phone within reach;with bed alarm set   Nurse Communication Mobility status;Other (comment) (need for replacement pulse ox)         Time: 5176-1607 OT Time Calculation (min): 41 min  Charges: OT General Charges $OT Visit: 1 Visit OT Treatments $Self Care/Home Management : 8-22 mins $Therapeutic Activity: 23-37 mins  Fredirick Maudlin, OTR/L [place]

## 2020-04-02 DIAGNOSIS — R7881 Bacteremia: Secondary | ICD-10-CM | POA: Diagnosis not present

## 2020-04-02 DIAGNOSIS — I639 Cerebral infarction, unspecified: Secondary | ICD-10-CM | POA: Diagnosis not present

## 2020-04-02 DIAGNOSIS — I10 Essential (primary) hypertension: Secondary | ICD-10-CM | POA: Diagnosis not present

## 2020-04-02 DIAGNOSIS — U071 COVID-19: Secondary | ICD-10-CM | POA: Diagnosis not present

## 2020-04-02 LAB — GLUCOSE, CAPILLARY
Glucose-Capillary: 141 mg/dL — ABNORMAL HIGH (ref 70–99)
Glucose-Capillary: 190 mg/dL — ABNORMAL HIGH (ref 70–99)
Glucose-Capillary: 218 mg/dL — ABNORMAL HIGH (ref 70–99)
Glucose-Capillary: 161 mg/dL — ABNORMAL HIGH (ref 70–99)

## 2020-04-02 LAB — BASIC METABOLIC PANEL
Anion gap: 11 (ref 5–15)
BUN: 21 mg/dL (ref 8–23)
CO2: 24 mmol/L (ref 22–32)
Calcium: 8.4 mg/dL — ABNORMAL LOW (ref 8.9–10.3)
Chloride: 104 mmol/L (ref 98–111)
Creatinine, Ser: 1.04 mg/dL — ABNORMAL HIGH (ref 0.44–1.00)
GFR, Estimated: 58 mL/min — ABNORMAL LOW (ref >60)
Glucose, Bld: 162 mg/dL — ABNORMAL HIGH (ref 70–99)
Potassium: 3.7 mmol/L (ref 3.5–5.1)
Sodium: 139 mmol/L (ref 135–145)

## 2020-04-02 LAB — CULTURE, BLOOD (ROUTINE X 2)
Culture: NO GROWTH
Special Requests: ADEQUATE

## 2020-04-02 MED ORDER — LEVOFLOXACIN 250 MG PO TABS
250.0000 mg | ORAL_TABLET | Freq: Every day | ORAL | Status: AC
  Administered 2020-04-02: 250 mg via ORAL
  Filled 2020-04-02: qty 1

## 2020-04-02 MED ORDER — LEVOFLOXACIN IN D5W 250 MG/50ML IV SOLN: 250.0000 mg | INTRAVENOUS | Status: DC

## 2020-04-02 MED ORDER — LEVOFLOXACIN 250 MG PO TABS
250.0000 mg | ORAL_TABLET | Freq: Every day | ORAL | Status: DC
  Filled 2020-04-02: qty 1

## 2020-04-02 NOTE — Progress Notes (Addendum)
PHARMACY NOTE:  ANTIMICROBIAL RENAL DOSAGE ADJUSTMENT & IV to PO Conversion  Current antimicrobial regimen includes a mismatch between antimicrobial dosage and estimated renal function.  As per policy approved by the Pharmacy & Therapeutics and Medical Executive Committees, the antimicrobial dosage will be adjusted accordingly.  Current antimicrobial dosage: Levofloxacin 500mg  IV Q24h  Indication: empiric coverage for possible UTI, PNA  Renal Function: Estimated Creatinine Clearance: 45.3 mL/min (A) (by C-G formula based on SCr of 1.04 mg/dL (H)).    Antimicrobial dosage has been changed to:  Levofloxacin 250mg  PO T02I  Thank you for allowing pharmacy to be a part of this patient's care.  Benn Moulder, PharmD Pharmacy Resident  04/02/2020 11:30 AM

## 2020-04-02 NOTE — Progress Notes (Signed)
PROGRESS NOTE    Erin Hamilton   [id number]  DOB: 1951-02-04  PCP: Kirk Ruths, MD    DOA: 03/28/2020 LOS: 4   Brief Narrative / Hospital Course to-date   70 y.o. female with medical history significant for dementia, failure to thrive, prior history of stroke in September 2019 with residual right-sided weakness, hyperlipidemia, diabetes mellitus, hypertension, presented to the ED on 03/28/2020 via EMS from home for chief concerns of weakness and frequent falls.  Daughter reported patient having difficulty walking for about 5 days.    Evaluation in the ED revealed hypoxia with O2 sat of 86 to 89% on room air, requiring 2 L/min supplemental oxygen.  COVID-19 PCR was positive.  Chest x-ray showed patchy interstitial opacities in both lung bases reflecting multifocal COVID-19 pneumonia.  Admitted to hospitalist service for further management of acute respiratory failure with hypoxia secondary to COVID-19 pneumonia.  Started on remdesivir and Solu-Medrol in addition to supportive care.    Significant Events: - admitted 03/28/20  Date of +Covid Test: 03/28/20 in ED  Vaccination status: unvaccinated  Assessment & Plan   Principal Problem:   Pneumonia due to COVID-19 virus Active Problems:   UTI (urinary tract infection)   Acute hypoxemic respiratory failure due to COVID-19 (HCC)   Positive blood culture   Acute metabolic encephalopathy   Type 2 diabetes mellitus with hyperglycemia, without long-term current use of insulin (HCC)   Essential hypertension, benign   Hyperlipidemia   Depression   Cognitive dysfunction due to acute cerebrovascular accident (CVA) ([place])   Hemiparesis affecting left side as late effect of cerebrovascular accident ([place])   H/O carotid endarterectomy   H/O: stroke   Acute respiratory failure with hypoxia secondary to multifocal COVID-19 pneumonia with superimposed bacterial infection -present on admission.  Appears resolved, pt off oxygen and  without any symptomatic complaints. --Completed remdesivir  --Completing Levaquin today, concern for secondary bacterial PNA  --Completed steroids --Antitussives, bronchodilators --Incentive spirometry and flutter valve --O2 to maintain O2 sat 88 to 93% at rest, wean as tolerated  General debility, generalized weakness, failure to thrive -TOC consulted.  PT and OT have evaluated and recommend SNF at discharge.  Placement is pending, awaiting bed offers.  UTI - present on admission with suprapubic tenderness and UA consistent with infection.  Initial urine culture grew multiple species, requested repeat which appears was never sent.  Was treated with empiric Levaquin (as above for PNA).  Currently asymptomatic.  Hypokalemia - replaced.  Follow BMP. Replace as needed.  Positive blood culture - initial Bcx with 1 (aerobic) of 4 bottles grew Staph auricularis which, when isolated from single bcx when multiple are draw, is of unknown significance.  Suspect contaminant at this time.   Pt has been treated with empiric Levaquin, last dose today 2/1.   Repeat Bcx is negative.    Acute metabolic encephalopathy - appears to have some hospital delirium / sundowning per overnight report by nursing of confusion, disoriented, difficult to reorient.  No new focal neurologic deficits. Her baseline cognitive status not entirely clear but has some baseline impairment. --started on low dose Seroquel qHS, appears well-tolerated ----Delirium precautions:     -Lights and TV off, minimize interruptions at night    -Blinds open and lights on during day    -Glasses/hearing aid with patient    -Frequent reorientation    -PT/OT when able    -Avoid sedation medications as much as possible  Hypertension -continue home lisinopril  Non-insulin-dependent type 2  diabetes - hold Metformin. Cover with sliding scale NovoLog for now.    Hyperlipidemia -continue home Lipitor  History of stroke, left MCA -with residual  right-sided weakness.  Continue home Plavix, aspirin, Lipitor  Peripheral neuropathy -most likely secondary to diabetes.  Continue home gabapentin.   DVT prophylaxis: enoxaparin (LOVENOX) injection 40 mg Start: 03/28/20 2200 Place TED hose Start: 03/28/20 1529   Diet:  Diet Orders (From admission, onward)    Start     Ordered   03/28/20 1526  Diet Heart Room service appropriate? Yes; Fluid consistency: Thin  Diet effective now       Question Answer Comment  Room service appropriate? Yes   Fluid consistency: Thin      03/28/20 1526            Code Status: Partial Code    Subjective 04/02/20    Patient seen awake resting in bed.  Reports feeling okay.  Has no complaints except she wants to go home.  Asks when she'll go home.  Says she does not want to stay anywhere, wants to go home.  Disposition Plan & Communication   Status is: Inpatient  Remains inpatient appropriate because patient requires SNF for short term rehab and placement is pending.  Unsafe to d/c to prior home environment at this time.   Dispo: The patient is from: Home              Anticipated d/c is to: SNF              Anticipated d/c date is: pending SNF bed              Medically stable for d/c: Yes   Difficult to place patient: No   Family Communication: None at bedside during encounter will attempt to call   Consults, Procedures, Treatments   Consultants:   None  Procedures:   None  Covid-specific treatments: - remdesivir 03/28/20 >> 04/01/20 - Solu-medrol 03/28/20 >> 03/30/20 - Prednisone 1/30 >> 1/31  Antimicrobials:  Anti-infectives (From admission, onward)   Start     Dose/Rate Route Frequency Ordered Stop   04/02/20 1900  Levofloxacin (LEVAQUIN) IVPB 250 mg  Status:  Discontinued        250 mg 50 mL/hr over 60 Minutes Intravenous Every 24 hours 04/02/20 1129 04/02/20 1138   04/02/20 1800  levofloxacin (LEVAQUIN) tablet 250 mg  Status:  Discontinued        250 mg Oral Daily  04/02/20 1140 04/02/20 1421   04/02/20 1800  levofloxacin (LEVAQUIN) tablet 250 mg        250 mg Oral Daily 04/02/20 1421 04/03/20 0959   04/01/20 1900  levofloxacin (LEVAQUIN) IVPB 500 mg  Status:  Discontinued        500 mg 100 mL/hr over 60 Minutes Intravenous Every 24 hours 04/01/20 1457 04/02/20 1129   03/29/20 1900  Levofloxacin (LEVAQUIN) IVPB 250 mg  Status:  Discontinued        250 mg 50 mL/hr over 60 Minutes Intravenous Every 24 hours 03/29/20 1652 04/01/20 1457   03/29/20 1000  remdesivir 100 mg in sodium chloride 0.9 % 100 mL IVPB       "Followed by" Linked Group Details   100 mg 200 mL/hr over 30 Minutes Intravenous Daily 03/28/20 1512 04/01/20 0911   03/29/20 1000  remdesivir 100 mg in sodium chloride 0.9 % 100 mL IVPB  Status:  Discontinued       "Followed by" Linked Group Details  100 mg 200 mL/hr over 30 Minutes Intravenous Daily 03/28/20 1530 03/28/20 1531   03/28/20 1545  remdesivir 200 mg in sodium chloride 0.9% 250 mL IVPB  Status:  Discontinued       "Followed by" Linked Group Details   200 mg 580 mL/hr over 30 Minutes Intravenous Once 03/28/20 1530 03/28/20 1531   03/28/20 1515  remdesivir 200 mg in sodium chloride 0.9% 250 mL IVPB       "Followed by" Linked Group Details   200 mg 580 mL/hr over 30 Minutes Intravenous Once 03/28/20 1512 03/28/20 1905   03/28/20 1415  levofloxacin (LEVAQUIN) IVPB 750 mg        750 mg 100 mL/hr over 90 Minutes Intravenous  Once 03/28/20 1407 03/28/20 1808   03/28/20 1000  nitrofurantoin (macrocrystal-monohydrate) (MACROBID) capsule 100 mg  Status:  Discontinued        100 mg Oral Every 12 hours 03/28/20 0441 03/29/20 1652   03/28/20 0230  nitrofurantoin (macrocrystal-monohydrate) (MACROBID) capsule 100 mg        100 mg Oral  Once 03/28/20 0229 03/28/20 0255        Objective   Vitals:   04/01/20 2024 04/01/20 2328 04/02/20 0536 04/02/20 0823  BP: (!) 151/83 (!) 156/89 131/65 (!) 166/82  Pulse: 86 82 79 79  Resp: 18 20 18  16   Temp: 98 F (36.7 C) 98.1 F (36.7 C) 98.1 F (36.7 C) 97.6 F (36.4 C)  TempSrc:   Oral   SpO2: 100% 96% 95% 100%  Weight:      Height:       No intake or output data in the 24 hours ending 04/02/20 1448 Filed Weights   03/27/20 1319 03/28/20 2033  Weight: 64 kg 64 kg    Physical Exam:  General exam: awake, alert, no acute distress Respiratory system: CTAB, normal respiratory effort, on room air, no accessory muscle use. Cardiovascular system:  RRR, no peripheral edema, 2+ radial pulses Gastrointestinal system: soft and non-tender   Labs   Data Reviewed: I have personally reviewed following labs and imaging studies  CBC: Recent Labs  Lab 03/27/20 1323 03/28/20 1521 03/29/20 0402 03/30/20 0445 03/31/20 0457  WBC 4.2 4.8 2.4* 3.5* 4.1  NEUTROABS  --  3.0 1.2* 2.1 2.5  HGB 13.7 13.7 13.6 13.2 13.3  HCT 41.9 41.8 41.1 39.2 39.5  MCV 92.3 92.7 91.5 90.7 91.6  PLT 203 186 187 211 [id number]   Basic Metabolic Panel: Recent Labs  Lab 03/29/20 0402 03/30/20 0445 03/31/20 0457 04/01/20 0430 04/02/20 0715  NA 143 141 140 138 139  K 3.6 4.3 3.9 3.3* 3.7  CL 104 106 103 103 104  CO2 26 27 25 24 24   GLUCOSE 182* 252* 239* 190* 162*  BUN 22 29* 29* 23 21  CREATININE 1.06* 1.08* 1.08* 0.87 1.04*  CALCIUM 8.5* 8.6* 8.5* 8.3* 8.4*  MG  --   --  2.1  --   --    GFR: Estimated Creatinine Clearance: 45.3 mL/min (A) (by C-G formula based on SCr of 1.04 mg/dL (H)). Liver Function Tests: Recent Labs  Lab 03/28/20 2340 03/29/20 0402 03/30/20 0445 03/31/20 0457 04/01/20 0430  AST 26 25 25 24 28   ALT 18 20 18 17 20   ALKPHOS 79 84 75 80 82  BILITOT 0.6 0.7 0.5 0.6 0.8  PROT 6.4* 6.7 6.2* 6.3* 6.2*  ALBUMIN 3.0* 3.1* 2.9* 2.8* 2.8*   No results for input(s): LIPASE, AMYLASE in the last 168 hours.  No results for input(s): AMMONIA in the last 168 hours. Coagulation Profile: Recent Labs  Lab 03/28/20 1521  INR 1.0   Cardiac Enzymes: No results for input(s):  CKTOTAL, CKMB, CKMBINDEX, TROPONINI in the last 168 hours. BNP (last 3 results) No results for input(s): PROBNP in the last 8760 hours. HbA1C: No results for input(s): HGBA1C in the last 72 hours. CBG: Recent Labs  Lab 04/01/20 1152 04/01/20 1704 04/01/20 2130 04/02/20 0815 04/02/20 1151  GLUCAP 92 156* 204* 141* 190*   Lipid Profile: No results for input(s): CHOL, HDL, LDLCALC, TRIG, CHOLHDL, LDLDIRECT in the last 72 hours. Thyroid Function Tests: No results for input(s): TSH, T4TOTAL, FREET4, T3FREE, THYROIDAB in the last 72 hours. Anemia Panel: No results for input(s): VITAMINB12, FOLATE, FERRITIN, TIBC, IRON, RETICCTPCT in the last 72 hours. Sepsis Labs: Recent Labs  Lab 03/28/20 1522 03/28/20 1523 03/28/20 2128  PROCALCITON  --  <0.10  --   LATICACIDVEN 1.3  --  1.4    Recent Results (from the past 240 hour(s))  Urine Culture     Status: Abnormal   Collection Time: 03/27/20  4:14 PM   Specimen: Urine, Random  Result Value Ref Range Status   Specimen Description   Final    URINE, RANDOM Performed at [facility], [address], [place], [place] [address]    Special Requests   Final    NONE Performed at [facility], [address] [place], [place] [address]    Culture MULTIPLE SPECIES PRESENT, SUGGEST RECOLLECTION (A)  Final   Report Status 03/29/2020 FINAL  Final  SARS CORONAVIRUS 2 (TAT 6-24 HRS) Nasopharyngeal Nasopharyngeal Swab     Status: Abnormal   Collection Time: 03/28/20  2:59 AM   Specimen: Nasopharyngeal Swab  Result Value Ref Range Status   SARS Coronavirus 2 POSITIVE (A) NEGATIVE Final    Comment: (NOTE) SARS-CoV-2 target nucleic acids are DETECTED.  The SARS-CoV-2 RNA is generally detectable in upper and lower respiratory specimens during the acute phase of infection. Positive results are indicative of the presence of SARS-CoV-2 RNA. Clinical correlation with patient history and other diagnostic information is   necessary to determine patient infection status. Positive results do not rule out bacterial infection or co-infection with other viruses.  The expected result is Negative.  Fact Sheet for Patients: SugarRoll.be  Fact Sheet for Healthcare Providers: https://www.woods-mathews.com/  This test is not yet approved or cleared by the [place] FDA and  has been authorized for detection and/or diagnosis of SARS-CoV-2 by FDA under an Emergency Use Authorization (EUA). This EUA will remain  in effect (meaning this test can be used) for the duration of the COVID-19 declaration under Section 564(b)(1) of the Act, 21 U. S.C. section 360bbb-3(b)(1), unless the authorization is terminated or revoked sooner.   Performed at [facility], [place] [address] [place], [place] [address]   Blood Culture (routine x 2)     Status: None   Collection Time: 03/28/20  4:06 PM   Specimen: BLOOD  Result Value Ref Range Status   Specimen Description BLOOD BLOOD LEFT WRIST  Final   Special Requests   Final    BOTTLES DRAWN AEROBIC AND ANAEROBIC Blood Culture adequate volume   Culture   Final    NO GROWTH 5 DAYS Performed at [facility], [address], [place], [place] [address]    Report Status 04/02/2020 FINAL  Final  Blood Culture (routine x 2)     Status: Abnormal  Collection Time: 03/28/20  4:06 PM   Specimen: BLOOD  Result Value Ref Range Status   Specimen Description   Final    BLOOD LEFT ANTECUBITAL Performed at [facility], [address] [place], [place] [address]    Special Requests   Final    BOTTLES DRAWN AEROBIC AND ANAEROBIC Blood Culture adequate volume Performed at [facility], [address] [place], [place] [address]    Culture  Setup Time   Final    Organism ID to follow GRAM POSITIVE COCCI AEROBIC BOTTLE ONLY CRITICAL RESULT CALLED TO, READ BACK BY AND VERIFIED WITH: NATHAN BELUE AT [place]  03/30/20. MF Performed at [facility], [address] [place], [place] [address]    Culture (A)  Final    STAPHYLOCOCCUS AURICULARIS THE SIGNIFICANCE OF ISOLATING THIS ORGANISM FROM A SINGLE SET OF BLOOD CULTURES WHEN MULTIPLE SETS ARE DRAWN IS UNCERTAIN. PLEASE NOTIFY THE MICROBIOLOGY DEPARTMENT WITHIN ONE WEEK IF SPECIATION AND SENSITIVITIES ARE REQUIRED. Performed at [facility], [place] [address], [place], [place] [address]    Report Status 04/01/2020 FINAL  Final  Blood Culture ID Panel (Reflexed)     Status: Abnormal   Collection Time: 03/28/20  4:06 PM  Result Value Ref Range Status   Enterococcus faecalis NOT DETECTED NOT DETECTED Final   Enterococcus Faecium NOT DETECTED NOT DETECTED Final   Listeria monocytogenes NOT DETECTED NOT DETECTED Final   Staphylococcus species DETECTED (A) NOT DETECTED Final    Comment: CRITICAL RESULT CALLED TO, READ BACK BY AND VERIFIED WITH: NATHAN BELUE AT 0105 03/30/20. MF    Staphylococcus aureus (BCID) NOT DETECTED NOT DETECTED Final   Staphylococcus epidermidis NOT DETECTED NOT DETECTED Final   Staphylococcus lugdunensis NOT DETECTED NOT DETECTED Final   Streptococcus species NOT DETECTED NOT DETECTED Final   Streptococcus agalactiae NOT DETECTED NOT DETECTED Final   Streptococcus pneumoniae NOT DETECTED NOT DETECTED Final   Streptococcus pyogenes NOT DETECTED NOT DETECTED Final   A.calcoaceticus-baumannii NOT DETECTED NOT DETECTED Final   Bacteroides fragilis NOT DETECTED NOT DETECTED Final   Enterobacterales NOT DETECTED NOT DETECTED Final   Enterobacter cloacae complex NOT DETECTED NOT DETECTED Final   Escherichia coli NOT DETECTED NOT DETECTED Final   Klebsiella aerogenes NOT DETECTED NOT DETECTED Final   Klebsiella oxytoca NOT DETECTED NOT DETECTED Final   Klebsiella pneumoniae NOT DETECTED NOT DETECTED Final   Proteus species NOT DETECTED NOT DETECTED Final   Salmonella species NOT DETECTED NOT DETECTED Final    Serratia marcescens NOT DETECTED NOT DETECTED Final   Haemophilus influenzae NOT DETECTED NOT DETECTED Final   Neisseria meningitidis NOT DETECTED NOT DETECTED Final   Pseudomonas aeruginosa NOT DETECTED NOT DETECTED Final   Stenotrophomonas maltophilia NOT DETECTED NOT DETECTED Final   Candida albicans NOT DETECTED NOT DETECTED Final   Candida auris NOT DETECTED NOT DETECTED Final   Candida glabrata NOT DETECTED NOT DETECTED Final   Candida krusei NOT DETECTED NOT DETECTED Final   Candida parapsilosis NOT DETECTED NOT DETECTED Final   Candida tropicalis NOT DETECTED NOT DETECTED Final   Cryptococcus neoformans/gattii NOT DETECTED NOT DETECTED Final    Comment: Performed at [facility], [address], [place], [place] [address]      Imaging Studies   No results found.   Medications   Scheduled Meds: . aspirin EC  81 mg Oral Daily  . atorvastatin  80 mg Oral Daily  . clopidogrel  75 mg Oral Daily  . enoxaparin (  LOVENOX) injection  40 mg Subcutaneous Q24H  . ezetimibe  10 mg Oral Daily  . gabapentin  100 mg Oral TID  . insulin aspart  0-9 Units Subcutaneous TID WC  . levofloxacin  250 mg Oral Daily  . lisinopril  10 mg Oral Daily  . melatonin  5 mg Oral QHS  . QUEtiapine  25 mg Oral QHS   Continuous Infusions:      LOS: 4 days    Time spent: 20 minutes    Ezekiel Slocumb, DO [facility]  04/02/2020, 2:48 PM    If 7PM-7AM, please contact night-coverage. How to contact the [facility] Attending or Consulting provider [place] or covering provider during after hours [place], for this patient?    1. Check the care team in [facility] and look for a) attending/consulting TRH provider listed and b) the [facility] team listed 2. Log into [email or web address] and use [place]'s universal password to access. If you do not have the password, please contact the hospital operator. 3. Locate the [facility] provider you are looking for under [facility] and page to a number that you  can be directly reached. 4. If you still have difficulty reaching the provider, please page the [facility] (Director on Call) for the Hospitalists listed on amion for assistance.

## 2020-04-02 NOTE — Progress Notes (Signed)
Physical Therapy Treatment Patient Details Name: Erin Hamilton MRN: [id number] DOB: 1951/02/09 Today's Date: 04/02/2020    History of Present Illness Per MD notes: Pt is a 70 y.o. female with medical history significant for dementia, failure to thrive, prior history of stroke in September 2019, hyperlipidemia, diabetes mellitus, hypertension, presented to EMS from home for chief concerns of weakness and frequent falling.  MD assessment includes: Acute hypoxemic respiratory failure due to COVID-19, debility, general weakness, and HTN.    PT Comments    Pt was long sitting in bed with lunch tray in front of her upon arriving. She states she just got lunch and it was cold. She was oriented to self only.  Max encouragement for OOB however pt was unwilling. She requested bed be adjusted and needing ice water at least 5 x during short PT session. Was able to cooperate enough to perform bed level there ex. Acute PT will continue to follow and progress as able per POC. Cognition most limiting factor this session. She will greatly benefit from SNF at DC to address deficits while improving independence with ADLs.    Follow Up Recommendations  SNF;Supervision for mobility/OOB     Equipment Recommendations  Other (comment) (defer to next level of care)    Recommendations for Other Services       Precautions / Restrictions Precautions Precautions: Fall Restrictions Weight Bearing Restrictions: No    Mobility  Bed Mobility    General bed mobility comments: pt refused OOB/EOB activity. did participate in ther ex in bed with max encouragement and tactile cues                 Cognition Arousal/Alertness: Awake/alert Behavior During Therapy: Flat affect;Impulsive Overall Cognitive Status: History of cognitive impairments - at baseline        General Comments: Pt is oriented to self only. requested to have bed adjusted throughout. Just eating lunch now.         General Comments  General comments (skin integrity, edema, etc.): Pt performed several Le ther ex however needs constant cueing to stay on task. perseverating on water and adjusting bed.      Pertinent Vitals/Pain Pain Assessment: No/denies pain           PT Goals (current goals can now be found in the care plan section) Acute Rehab PT Goals Patient Stated Goal: none stated Progress towards PT goals: Not progressing toward goals - comment (cognition limiting)    Frequency    Min 2X/week      PT Plan Current plan remains appropriate       AM-PAC PT "6 Clicks" Mobility   Outcome Measure  Help needed turning from your back to your side while in a flat bed without using bedrails?: A Lot Help needed moving from lying on your back to sitting on the side of a flat bed without using bedrails?: A Lot Help needed moving to and from a bed to a chair (including a wheelchair)?: Total Help needed standing up from a chair using your arms (e.g., wheelchair or bedside chair)?: Total Help needed to walk in hospital room?: Total Help needed climbing 3-5 steps with a railing? : Total 6 Click Score: 8    End of Session   Activity Tolerance: Other (comment) (limited session 2/2 to pt's unwillingness to perform more than bed level exercises. Pt is pleasantly confused  and oriented to self only.) Patient left: in bed;with call bell/phone within reach;with bed alarm set Nurse Communication:  Mobility status PT Visit Diagnosis: History of falling (Z91.81);Difficulty in walking, not elsewhere classified (R26.2);Muscle weakness (generalized) (M62.81)     Time: 6226-3335 PT Time Calculation (min) (ACUTE ONLY): 12 min  Charges:  $Therapeutic Exercise: 8-22 mins                     Julaine Fusi PTA 04/02/20, 4:55 PM

## 2020-04-02 NOTE — TOC Progression Note (Signed)
Transition of Care [facility]) - Progression Note    Patient Details  Name: Erin Hamilton MRN: [id number] Date of Birth: 08-20-1950  Transition of Care [facility]) CM/SW [place], LCSW Phone Number: 04/02/2020, 12:43 PM  Clinical Narrative:   Left another voicemail for daughter Erin Hamilton. Spoke to daughter Erin Hamilton. She said she hasn't spoken to her sister in a couple of days either. She confirmed she's agreeable to SNF placement. Told her there are no bed offers and that local facilities won't take her until she's at least 10 days from positive test.   Expected Discharge Plan: [place] Barriers to Discharge: Continued Medical Work up  Expected Discharge Plan and Services Expected Discharge Plan: [place]   Discharge Planning Services: CM Consult Post Acute Care Choice: [place] Living arrangements for the past 2 months: Single Family Home                                       Social Determinants of Health (SDOH) Interventions    Readmission Risk Interventions No flowsheet data found.

## 2020-04-03 DIAGNOSIS — I69359 Hemiplegia and hemiparesis following cerebral infarction affecting unspecified side: Secondary | ICD-10-CM | POA: Diagnosis not present

## 2020-04-03 DIAGNOSIS — I10 Essential (primary) hypertension: Secondary | ICD-10-CM | POA: Diagnosis not present

## 2020-04-03 DIAGNOSIS — G9341 Metabolic encephalopathy: Secondary | ICD-10-CM | POA: Diagnosis not present

## 2020-04-03 DIAGNOSIS — U071 COVID-19: Secondary | ICD-10-CM | POA: Diagnosis not present

## 2020-04-03 LAB — GLUCOSE, CAPILLARY
Glucose-Capillary: 144 mg/dL — ABNORMAL HIGH (ref 70–99)
Glucose-Capillary: 141 mg/dL — ABNORMAL HIGH (ref 70–99)
Glucose-Capillary: 207 mg/dL — ABNORMAL HIGH (ref 70–99)
Glucose-Capillary: 134 mg/dL — ABNORMAL HIGH (ref 70–99)

## 2020-04-03 NOTE — Progress Notes (Addendum)
[facility]  PROGRESS NOTE  Erin Hamilton [id number] DOB: Dec 18, 1950 DOA: 03/28/2020 PCP: Kirk Ruths, MD   Brief HPI:   70 year old female with medical history of dementia, failure to thrive, prior history of stroke in September 2019 with residual right-sided weakness, hyperlipidemia, diabetes mellitus type 2, hypertension presented to ED on 03/28/2020 via EMS with chief complaints of weakness and frequent falls.  As per daughter she was having difficulty walking for past 5 days.  In the ED she was found to be hypoxemic with O2 sats 86 to 89% on room air, requiring 2 L/min of oxygen.  COVID-19 PCR was positive.  Chest x-ray showed patchy interstitial opacities in both lung bases reflecting multifocal COVID-19 pneumonia.  She was started on Solu-Medrol, remdesivir.  Vaccination status-unvaccinated  Date of positive Covid test-03/28/2020  Subjective   Patient seen and examined, asking for water.  Denies shortness of breath.   Assessment/Plan:     1. Acute respiratory failure with hypoxemia-secondary to multifocal COVID-19 pneumonia with superimposed bacterial infection.  Patient was treated with remdesivir, steroids, antitussives and bronchodilators.  She also received Levaquin for concern for secondary bacterial pneumonia.  Currently she is requiring 2 L/min of oxygen, wean off oxygen as tolerated. 2. General debility/failure to thrive-PT/OT consulted, plan to go to skilled nursing facility. 3. ?  UTI-patient presented with suprapubic tenderness and UA consistent with UTI.  Initial urine culture grew multiple species.  Patient was treated with empiric Levaquin for 5 days for pneumonia as above. 4. Positive blood culture-patient blood culture grew 1 out of 4 bottles staph auricularis, likely contamination.  Patient was treated with empiric Levaquin.   5. Acute metabolic encephalopathy-patient has developed confusion in the hospital, likely delirium.  She has no new focal  neurological deficits.  CT head obtained on 03/28/2020 showed stable remote infarcts including right MCA territory infarct.  No other acute abnormality.  Likely has vascular dementia at baseline.  Continue delirium precautions. 6. Hypertension-blood pressures controlled, continue lisinopril. 7. Diabetes mellitus type 2-Metformin on hold, CBG well controlled.  Continue sensitive sliding scale insulin.  Will not add long-acting insulin at this time as patient blood sugars well controlled. 8. Hyperlipidemia-continue Lipitor. 9. History of left MCA stroke-she has residual right-sided weakness, continue home medication including Plavix, aspirin, Lipitor. 10. Peripheral neuropathy-likely secondary diabetes mellitus type 2.  Continue gabapentin.     COVID-19 Labs  No results for input(s): DDIMER, FERRITIN, LDH, CRP in the last 72 hours.  Lab Results  Component Value Date   SARSCOV2NAA POSITIVE (A) 03/28/2020     Scheduled medications:   . aspirin EC  81 mg Oral Daily  . atorvastatin  80 mg Oral Daily  . clopidogrel  75 mg Oral Daily  . enoxaparin (LOVENOX) injection  40 mg Subcutaneous Q24H  . ezetimibe  10 mg Oral Daily  . gabapentin  100 mg Oral TID  . insulin aspart  0-9 Units Subcutaneous TID WC  . lisinopril  10 mg Oral Daily  . melatonin  5 mg Oral QHS  . QUEtiapine  25 mg Oral QHS         CBG: Recent Labs  Lab 04/02/20 1151 04/02/20 1631 04/02/20 2038 04/03/20 0820 04/03/20 1206  GLUCAP 190* 218* 161* 144* 141*    SpO2: 96 % O2 Flow Rate (L/min): 2 L/min    CBC: Recent Labs  Lab 03/27/20 1323 03/28/20 1521 03/29/20 0402 03/30/20 0445 03/31/20 0457  WBC 4.2 4.8 2.4* 3.5* 4.1  NEUTROABS  --  3.0 1.2* 2.1 2.5  HGB 13.7 13.7 13.6 13.2 13.3  HCT 41.9 41.8 41.1 39.2 39.5  MCV 92.3 92.7 91.5 90.7 91.6  PLT 203 186 187 211 366    Basic Metabolic Panel: Recent Labs  Lab 03/29/20 0402 03/30/20 0445 03/31/20 0457 04/01/20 0430 04/02/20 0715  NA 143 141  140 138 139  K 3.6 4.3 3.9 3.3* 3.7  CL 104 106 103 103 104  CO2 26 27 25 24 24   GLUCOSE 182* 252* 239* 190* 162*  BUN 22 29* 29* 23 21  CREATININE 1.06* 1.08* 1.08* 0.87 1.04*  CALCIUM 8.5* 8.6* 8.5* 8.3* 8.4*  MG  --   --  2.1  --   --      Liver Function Tests: Recent Labs  Lab 03/28/20 2340 03/29/20 0402 03/30/20 0445 03/31/20 0457 04/01/20 0430  AST 26 25 25 24 28   ALT 18 20 18 17 20   ALKPHOS 79 84 75 80 82  BILITOT 0.6 0.7 0.5 0.6 0.8  PROT 6.4* 6.7 6.2* 6.3* 6.2*  ALBUMIN 3.0* 3.1* 2.9* 2.8* 2.8*     Antibiotics: Anti-infectives (From admission, onward)   Start     Dose/Rate Route Frequency Ordered Stop   04/02/20 1900  Levofloxacin (LEVAQUIN) IVPB 250 mg  Status:  Discontinued        250 mg 50 mL/hr over 60 Minutes Intravenous Every 24 hours 04/02/20 1129 04/02/20 1138   04/02/20 1800  levofloxacin (LEVAQUIN) tablet 250 mg  Status:  Discontinued        250 mg Oral Daily 04/02/20 1140 04/02/20 1421   04/02/20 1800  levofloxacin (LEVAQUIN) tablet 250 mg        250 mg Oral Daily 04/02/20 1421 04/02/20 1709   04/01/20 1900  levofloxacin (LEVAQUIN) IVPB 500 mg  Status:  Discontinued        500 mg 100 mL/hr over 60 Minutes Intravenous Every 24 hours 04/01/20 1457 04/02/20 1129   03/29/20 1900  Levofloxacin (LEVAQUIN) IVPB 250 mg  Status:  Discontinued        250 mg 50 mL/hr over 60 Minutes Intravenous Every 24 hours 03/29/20 1652 04/01/20 1457   03/29/20 1000  remdesivir 100 mg in sodium chloride 0.9 % 100 mL IVPB       "Followed by" Linked Group Details   100 mg 200 mL/hr over 30 Minutes Intravenous Daily 03/28/20 1512 04/01/20 0911   03/29/20 1000  remdesivir 100 mg in sodium chloride 0.9 % 100 mL IVPB  Status:  Discontinued       "Followed by" Linked Group Details   100 mg 200 mL/hr over 30 Minutes Intravenous Daily 03/28/20 1530 03/28/20 1531   03/28/20 1545  remdesivir 200 mg in sodium chloride 0.9% 250 mL IVPB  Status:  Discontinued       "Followed by"  Linked Group Details   200 mg 580 mL/hr over 30 Minutes Intravenous Once 03/28/20 1530 03/28/20 1531   03/28/20 1515  remdesivir 200 mg in sodium chloride 0.9% 250 mL IVPB       "Followed by" Linked Group Details   200 mg 580 mL/hr over 30 Minutes Intravenous Once 03/28/20 1512 03/28/20 1905   03/28/20 1415  levofloxacin (LEVAQUIN) IVPB 750 mg        750 mg 100 mL/hr over 90 Minutes Intravenous  Once 03/28/20 1407 03/28/20 1808   03/28/20 1000  nitrofurantoin (macrocrystal-monohydrate) (MACROBID) capsule 100 mg  Status:  Discontinued        100 mg  Oral Every 12 hours 03/28/20 0441 03/29/20 1652   03/28/20 0230  nitrofurantoin (macrocrystal-monohydrate) (MACROBID) capsule 100 mg        100 mg Oral  Once 03/28/20 0229 03/28/20 0255       DVT prophylaxis: Lovenox  Code Status: Partial code/no CPR  Family Communication: Spoke to patient's daughter on phone.   Consultants:    Procedures:      Objective   Vitals:   04/03/20 0029 04/03/20 0426 04/03/20 0728 04/03/20 1153  BP: 108/64 102/63 110/66 (!) 144/80  Pulse: 78 75 81 81  Resp: 17 15 18 18   Temp: 98.6 F (37 C) 98 F (36.7 C) 97.8 F (36.6 C) 98.8 F (37.1 C)  TempSrc:  Oral Oral Oral  SpO2: 93% 97% 100% 96%  Weight:      Height:       No intake or output data in the 24 hours ending 04/03/20 1225  No intake/output data recorded.  Filed Weights   03/27/20 1319 03/28/20 2033  Weight: 64 kg 64 kg    Physical Examination:    General-appears in no acute distress  Heart-S1-S2, regular, no murmur auscultated  Lungs-decreased breath sounds at lung bases  Abdomen-soft, nontender, no organomegaly  Extremities-no edema in the lower extremities  Neuro-alert, oriented to self only   Status is: Inpatient  Dispo: The patient is from: Home              Anticipated d/c is to: Skilled nursing facility              Anticipated d/c date is: 04/07/2020              Patient currently not stable for  discharge  Barrier to discharge-ongoing management for multifocal pneumonia       Data Reviewed:   Recent Results (from the past 240 hour(s))  Urine Culture     Status: Abnormal   Collection Time: 03/27/20  4:14 PM   Specimen: Urine, Random  Result Value Ref Range Status   Specimen Description   Final    URINE, RANDOM Performed at [facility], [address], [place], [place] [address]    Special Requests   Final    NONE Performed at [facility], [address] [place], [place] [address]    Culture MULTIPLE SPECIES PRESENT, SUGGEST RECOLLECTION (A)  Final   Report Status 03/29/2020 FINAL  Final  SARS CORONAVIRUS 2 (TAT 6-24 HRS) Nasopharyngeal Nasopharyngeal Swab     Status: Abnormal   Collection Time: 03/28/20  2:59 AM   Specimen: Nasopharyngeal Swab  Result Value Ref Range Status   SARS Coronavirus 2 POSITIVE (A) NEGATIVE Final    Comment: (NOTE) SARS-CoV-2 target nucleic acids are DETECTED.  The SARS-CoV-2 RNA is generally detectable in upper and lower respiratory specimens during the acute phase of infection. Positive results are indicative of the presence of SARS-CoV-2 RNA. Clinical correlation with patient history and other diagnostic information is  necessary to determine patient infection status. Positive results do not rule out bacterial infection or co-infection with other viruses.  The expected result is Negative.  Fact Sheet for Patients: SugarRoll.be  Fact Sheet for Healthcare Providers: https://www.woods-mathews.com/  This test is not yet approved or cleared by the [place] FDA and  has been authorized for detection and/or diagnosis of SARS-CoV-2 by FDA under an Emergency Use Authorization (EUA). This EUA will remain  in effect (meaning this test can be used) for the duration of the COVID-19  declaration under Section 564(b)(1) of the Act, 21 U. S.C. section 360bbb-3(b)(1), unless the  authorization is terminated or revoked sooner.   Performed at [facility], [place] [address] [place], [place] [address]   Blood Culture (routine x 2)     Status: None   Collection Time: 03/28/20  4:06 PM   Specimen: BLOOD  Result Value Ref Range Status   Specimen Description BLOOD BLOOD LEFT WRIST  Final   Special Requests   Final    BOTTLES DRAWN AEROBIC AND ANAEROBIC Blood Culture adequate volume   Culture   Final    NO GROWTH 5 DAYS Performed at [facility], [address] [place], [place] [address]    Report Status 04/02/2020 FINAL  Final  Blood Culture (routine x 2)     Status: Abnormal   Collection Time: 03/28/20  4:06 PM   Specimen: BLOOD  Result Value Ref Range Status   Specimen Description   Final    BLOOD LEFT ANTECUBITAL Performed at [facility], [address], [place], [place] [address]    Special Requests   Final    BOTTLES DRAWN AEROBIC AND ANAEROBIC Blood Culture adequate volume Performed at [facility], [address] [place], [place] [address]    Culture  Setup Time   Final    Organism ID to follow [place] ONLY CRITICAL RESULT CALLED TO, READ BACK BY AND VERIFIED WITH: NATHAN BELUE AT [place] 03/30/20. MF Performed at [facility], [address] [place], [place] [address]    Culture (A)  Final    STAPHYLOCOCCUS AURICULARIS THE SIGNIFICANCE OF ISOLATING THIS ORGANISM FROM A SINGLE SET OF BLOOD CULTURES WHEN MULTIPLE SETS ARE DRAWN IS UNCERTAIN. PLEASE NOTIFY THE MICROBIOLOGY DEPARTMENT WITHIN ONE WEEK IF SPECIATION AND SENSITIVITIES ARE REQUIRED. Performed at [facility], [place] [address] [place], [place] [address]    Report Status 04/01/2020 FINAL  Final  Blood Culture ID Panel (Reflexed)     Status: Abnormal   Collection Time: 03/28/20  4:06 PM  Result Value Ref Range Status   Enterococcus faecalis NOT DETECTED NOT DETECTED Final   Enterococcus Faecium NOT DETECTED NOT  DETECTED Final   Listeria monocytogenes NOT DETECTED NOT DETECTED Final   Staphylococcus species DETECTED (A) NOT DETECTED Final    Comment: CRITICAL RESULT CALLED TO, READ BACK BY AND VERIFIED WITH: NATHAN BELUE AT 0105 03/30/20. MF    Staphylococcus aureus (BCID) NOT DETECTED NOT DETECTED Final   Staphylococcus epidermidis NOT DETECTED NOT DETECTED Final   Staphylococcus lugdunensis NOT DETECTED NOT DETECTED Final   Streptococcus species NOT DETECTED NOT DETECTED Final   Streptococcus agalactiae NOT DETECTED NOT DETECTED Final   Streptococcus pneumoniae NOT DETECTED NOT DETECTED Final   Streptococcus pyogenes NOT DETECTED NOT DETECTED Final   A.calcoaceticus-baumannii NOT DETECTED NOT DETECTED Final   Bacteroides fragilis NOT DETECTED NOT DETECTED Final   Enterobacterales NOT DETECTED NOT DETECTED Final   Enterobacter cloacae complex NOT DETECTED NOT DETECTED Final   Escherichia coli NOT DETECTED NOT DETECTED Final   Klebsiella aerogenes NOT DETECTED NOT DETECTED Final   Klebsiella oxytoca NOT DETECTED NOT DETECTED Final   Klebsiella pneumoniae NOT DETECTED NOT DETECTED Final   Proteus species NOT DETECTED NOT DETECTED Final   Salmonella species NOT DETECTED NOT DETECTED Final   Serratia marcescens NOT DETECTED NOT DETECTED Final   Haemophilus influenzae NOT DETECTED NOT DETECTED Final   Neisseria meningitidis NOT DETECTED NOT DETECTED Final  Pseudomonas aeruginosa NOT DETECTED NOT DETECTED Final   Stenotrophomonas maltophilia NOT DETECTED NOT DETECTED Final   Candida albicans NOT DETECTED NOT DETECTED Final   Candida auris NOT DETECTED NOT DETECTED Final   Candida glabrata NOT DETECTED NOT DETECTED Final   Candida krusei NOT DETECTED NOT DETECTED Final   Candida parapsilosis NOT DETECTED NOT DETECTED Final   Candida tropicalis NOT DETECTED NOT DETECTED Final   Cryptococcus neoformans/gattii NOT DETECTED NOT DETECTED Final    Comment: Performed at [facility], [address] [place], [place] [address]    No results for input(s): LIPASE, AMYLASE in the last 168 hours. No results for input(s): AMMONIA in the last 168 hours.  Cardiac Enzymes: No results for input(s): CKTOTAL, CKMB, CKMBINDEX, TROPONINI in the last 168 hours. BNP (last 3 results) No results for input(s): BNP in the last 8760 hours.  ProBNP (last 3 results) No results for input(s): PROBNP in the last 8760 hours.  Studies:  No results found.     Oswald Hillock   [facility] If 7PM-7AM, please contact night-coverage at [email or web address], Office  [phone]   04/03/2020, 12:25 PM  LOS: 5 days

## 2020-04-03 NOTE — TOC Progression Note (Addendum)
Transition of Care [facility]) - Progression Note    Patient Details  Name: Erin Hamilton MRN: [id number] Date of Birth: [birth year]  Transition of [facility] CM/SW [place], LCSW Phone Number: 04/03/2020, 10:35 AM  Clinical Narrative: No bed offers this morning. Sent updated therapy notes to the two COVID SNF's that had not responded yet: [facility]. Also sent referral to [facility] and put comment at top of referral asking them to review for potential admission once she has met their COVID requirements. Earliest one of these would be able to take her would be 2/6 once she is 10 days from her positive test.  12:11 pm: CSW spoke with daughter [name]. Provided update. She confirmed she is agreeable to SNF placement.  Expected Discharge Plan: [place] Barriers to Discharge: Continued Medical Work up  Expected Discharge Plan and Services Expected Discharge Plan: [place]   Discharge Planning Services: CM Consult Post Acute Care Choice: [place] Living arrangements for the past 2 months: Single Family Home                                       Social Determinants of Health (SDOH) Interventions    Readmission Risk Interventions No flowsheet data found.

## 2020-04-04 DIAGNOSIS — U071 COVID-19: Secondary | ICD-10-CM | POA: Diagnosis not present

## 2020-04-04 DIAGNOSIS — J1282 Pneumonia due to coronavirus disease 2019: Secondary | ICD-10-CM | POA: Diagnosis not present

## 2020-04-04 LAB — GLUCOSE, CAPILLARY
Glucose-Capillary: 133 mg/dL — ABNORMAL HIGH (ref 70–99)
Glucose-Capillary: 112 mg/dL — ABNORMAL HIGH (ref 70–99)
Glucose-Capillary: 202 mg/dL — ABNORMAL HIGH (ref 70–99)
Glucose-Capillary: 165 mg/dL — ABNORMAL HIGH (ref 70–99)

## 2020-04-04 NOTE — Progress Notes (Signed)
Occupational Therapy Treatment Patient Details Name: Erin Hamilton MRN: [id number] DOB: 12/30/1950 Today's Date: 04/04/2020    History of present illness Per MD notes: Pt is a 70 y.o. female with medical history significant for dementia, failure to thrive, prior history of stroke in September 2019, hyperlipidemia, diabetes mellitus, hypertension, presented to EMS from home for chief concerns of weakness and frequent falling.  MD assessment includes: Acute hypoxemic respiratory failure due to COVID-19, debility, general weakness, and HTN.   OT comments  Erin Hamilton was seen for OT treatment on this date. Upon arrival to room pt reclined in bed requesting to sit up. MOD A exit L side of bed - assist for squaring hips to edge and scooting forward. SBA self-drinking seated EOB. Initial MIN A + RW sit<>stand decreasing to CGA + RW for x18 sit<>stand trials and decreasing to MIN A with fatigue for final trial. MIN A return to bed. Pt left in bed in chair with RN in room. Pt making good progress toward goals. Pt continues to benefit from skilled OT services to maximize return to PLOF and minimize risk of future falls, injury, caregiver burden, and readmission. Will continue to follow POC. Discharge recommendation remains appropriate.    Follow Up Recommendations  SNF    Equipment Recommendations  3 in 1 bedside commode;Tub/shower seat;Other (comment) (2WW)    Recommendations for Other Services      Precautions / Restrictions Precautions Precautions: Fall Restrictions Weight Bearing Restrictions: No       Mobility Bed Mobility Overal bed mobility: Needs Assistance Bed Mobility: Supine to Sit;Sit to Supine     Supine to sit: Mod assist;HOB elevated Sit to supine: Min assist      Transfers Overall transfer level: Needs assistance Equipment used: Rolling walker (2 wheeled) Transfers: Sit to/from Stand Sit to Stand: Min guard;From elevated surface         General transfer comment:  x20 sit<>stand at EOB from elevated height    Balance Overall balance assessment: Needs assistance Sitting-balance support: Feet supported;No upper extremity supported Sitting balance-[name] Scale: Fair     Standing balance support: Bilateral upper extremity supported;During functional activity Standing balance-[name] Scale: Fair                             ADL either performed or assessed with clinical judgement   ADL Overall ADL's : Needs assistance/impaired                                       General ADL Comments: SBA self-drinking seated EOB. MAX A for LBD at bed level. CGA + RW for ADL t/f               Cognition Arousal/Alertness: Awake/alert Behavior During Therapy: Flat affect Overall Cognitive Status: History of cognitive impairments - at baseline                                 General Comments: Oriented to self and place as hospital        Exercises Exercises: Other exercises Other Exercises Other Exercises: Pt edcuated re: OT role, re-oriented to time, falls prevention Other Exercises: LBD, self-drinking, sup<>sit, sit<>stand, sitting/standing balance/tolerance, ~3 steps at EOB   Shoulder Instructions       General Comments  Pertinent Vitals/ Pain       Pain Assessment: No/denies pain         Frequency  Min 1X/week        Progress Toward Goals  OT Goals(current goals can now be found in the care plan section)  Progress towards OT goals: Progressing toward goals  Acute Rehab OT Goals Patient Stated Goal: none stated OT Goal Formulation: Patient unable to participate in goal setting Time For Goal Achievement: 04/12/20 Potential to Achieve Goals: Fair ADL Goals Pt Will Perform Grooming: with set-up;sitting Pt Will Perform Upper Body Dressing: with min assist;sitting  Plan Discharge plan remains appropriate;Frequency remains appropriate       AM-PAC OT "6 Clicks" Daily Activity      Outcome Measure   Help from another person eating meals?: A Little Help from another person taking care of personal grooming?: A Little Help from another person toileting, which includes using toliet, bedpan, or urinal?: A Little Help from another person bathing (including washing, rinsing, drying)?: A Lot Help from another person to put on and taking off regular upper body clothing?: A Lot Help from another person to put on and taking off regular lower body clothing?: A Lot 6 Click Score: 15    End of Session Equipment Utilized During Treatment: Rolling walker;Oxygen  OT Visit Diagnosis: Unsteadiness on feet (R26.81);Muscle weakness (generalized) (M62.81);Other symptoms and signs involving the nervous system (R29.898);Other symptoms and signs involving cognitive function   Activity Tolerance Patient tolerated treatment well   Patient Left in bed;with call bell/phone within reach;with bed alarm set   Nurse Communication Mobility status        Time: 7353-2992 OT Time Calculation (min): 18 min  Charges: OT General Charges $OT Visit: 1 Visit OT Treatments $Therapeutic Exercise: 8-22 mins  Erin Hamilton, M.S. OTR/L  04/04/20, 4:01 PM  ascom [phone]

## 2020-04-04 NOTE — Progress Notes (Signed)
Physical Therapy Treatment Patient Details Name: Erin Hamilton MRN: [id number] DOB: 11-26-1950 Today's Date: 04/04/2020    History of Present Illness Per MD notes: Pt is a 70 y.o. female with medical history significant for dementia, failure to thrive, prior history of stroke in September 2019, hyperlipidemia, diabetes mellitus, hypertension, presented to EMS from home for chief concerns of weakness and frequent falling.  MD assessment includes: Acute hypoxemic respiratory failure due to COVID-19, debility, general weakness, and HTN.    PT Comments    Pt was pleasant and put forth very good effort during the session.  Pt required min-mod A with bed mobility tasks with cues for sequencing.  Once in sitting pt able to maintain static sitting with SBA except while focused on donning L glove at which time pt required occasional min A to prevent posterior LOB.  Pt was able to stand from an elevated EOB with CGA only this session and ambulated 3 x 3-5 feet with no overt LOB or knee buckling.  Pt's SpO2 remained >/= 92% during the session with HR WNL and no adverse symptoms noted.  Pt will benefit from PT services in a SNF setting upon discharge to safely address deficits listed in patient problem list for decreased caregiver assistance and eventual return to PLOF.     Follow Up Recommendations  SNF;Supervision for mobility/OOB     Equipment Recommendations  Other (comment) (TBD at next venue of care)    Recommendations for Other Services       Precautions / Restrictions Precautions Precautions: Fall Restrictions Weight Bearing Restrictions: No    Mobility  Bed Mobility Overal bed mobility: Needs Assistance Bed Mobility: Supine to Sit;Sit to Supine;Rolling Rolling: Min assist   Supine to sit: Mod assist Sit to supine: Mod assist   General bed mobility comments: Log roll training with asssit needed for BLE and trunk control  Transfers Overall transfer level: Needs  assistance Equipment used: Rolling walker (2 wheeled) Transfers: Sit to/from Stand Sit to Stand: Min guard;From elevated surface         General transfer comment: Fair to good eccentric and concentric control and stability with mod verbal and tactile cues for hand placement  Ambulation/Gait Ambulation/Gait assistance: Min guard Gait Distance (Feet): 5 Feet Assistive device: Rolling walker (2 wheeled) Gait Pattern/deviations: Step-through pattern;Decreased step length - right;Decreased step length - left;Trunk flexed Gait velocity: decreased   General Gait Details: Pt able to amb 3-5 feet x 3 with seated therapeutic rest breaks between walks; mod lean on the RW for support but no overt LOB noted   Stairs             Wheelchair Mobility    Modified Rankin (Stroke Patients Only)       Balance Overall balance assessment: Needs assistance Sitting-balance support: Feet supported;No upper extremity supported Sitting balance-[name] Scale: Fair Sitting balance - Comments: Occasional min A to prevent posterior LOB most notably while attempting to don glove to L hand but otherwise pt SBA with static sitting Postural control: Posterior lean Standing balance support: Bilateral upper extremity supported;During functional activity Standing balance-[name] Scale: Fair Standing balance comment: Mod lean on the RW for support in standing and during amb but no LOB                            Cognition Arousal/Alertness: Awake/alert Behavior During Therapy: Flat affect Overall Cognitive Status: History of cognitive impairments - at baseline  General Comments: Oriented to self and place as hospital      Exercises Total Joint Exercises Ankle Circles/Pumps: AROM;Strengthening;Both;10 reps Short Arc Quad: Strengthening;Both;10 reps Heel Slides: AROM;Strengthening;Both;10 reps Long Arc Quad: Strengthening;Both;5 reps;10  reps Other Exercises Other Exercises: Pt edcuated re: OT role, re-oriented to time, falls prevention Other Exercises: LBD, self-drinking, sup<>sit, sit<>stand, sitting/standing balance/tolerance, ~3 steps at EOB    General Comments        Pertinent Vitals/Pain Pain Assessment: No/denies pain    Home Living                      Prior Function            PT Goals (current goals can now be found in the care plan section) Acute Rehab PT Goals Patient Stated Goal: none stated Progress towards PT goals: Progressing toward goals    Frequency    Min 2X/week      PT Plan Current plan remains appropriate    Co-evaluation              AM-PAC PT "6 Clicks" Mobility   Outcome Measure  Help needed turning from your back to your side while in a flat bed without using bedrails?: A Lot Help needed moving from lying on your back to sitting on the side of a flat bed without using bedrails?: A Lot Help needed moving to and from a bed to a chair (including a wheelchair)?: A Little Help needed standing up from a chair using your arms (e.g., wheelchair or bedside chair)?: A Little Help needed to walk in hospital room?: A Lot Help needed climbing 3-5 steps with a railing? : Total 6 Click Score: 13    End of Session Equipment Utilized During Treatment: Gait belt;Oxygen Activity Tolerance: Patient tolerated treatment well Patient left: in bed;with call bell/phone within reach;with bed alarm set Nurse Communication: Mobility status PT Visit Diagnosis: History of falling (Z91.81);Difficulty in walking, not elsewhere classified (R26.2);Muscle weakness (generalized) (M62.81)     Time: 1440-1510 PT Time Calculation (min) (ACUTE ONLY): 30 min  Charges:  $Gait Training: 8-22 mins $Therapeutic Exercise: 8-22 mins                     D. Scott Erin Hamilton PT, DPT 04/04/20, 4:10 PM

## 2020-04-04 NOTE — Progress Notes (Signed)
[facility]  PROGRESS NOTE  Erin Hamilton QVZ:[id number] DOB: 28-Oct-1950 DOA: 03/28/2020 PCP: Kirk Ruths, MD   Brief HPI:   70 year old female with medical history of dementia, failure to thrive, prior history of stroke in September 2019 with residual right-sided weakness, hyperlipidemia, diabetes mellitus type 2, hypertension presented to ED on 03/28/2020 via EMS with chief complaints of weakness and frequent falls.  As per daughter she was having difficulty walking for past 5 days.  In the ED she was found to be hypoxemic with O2 sats 86 to 89% on room air, requiring 2 L/min of oxygen.  COVID-19 PCR was positive.  Chest x-ray showed patchy interstitial opacities in both lung bases reflecting multifocal COVID-19 pneumonia.  She was started on Solu-Medrol, remdesivir.  Vaccination status-unvaccinated  Date of positive Covid test-03/28/2020  Subjective   Patient seen and examined, no new complaints.   Mental status is slowly improving.   Assessment/Plan:     1. Acute respiratory failure with hypoxemia-secondary to multifocal COVID-19 pneumonia with superimposed bacterial infection.  Patient was treated with remdesivir, steroids, antitussives and bronchodilators.  She also received Levaquin for concern for secondary bacterial pneumonia.  Currently she is requiring 2 L/min of oxygen, wean off oxygen as tolerated. 2. General debility/failure to thrive-PT/OT consulted, plan to go to skilled nursing facility when bed becomes available.. 3. ?  UTI-patient presented with suprapubic tenderness and UA consistent with UTI.  Initial urine culture grew multiple species.  Patient was treated with empiric Levaquin for 5 days for pneumonia as above. 4. Positive blood culture-patient blood culture grew 1 out of 4 bottles staph auricularis, likely contamination.  Patient was treated with empiric Levaquin.   5. Acute metabolic encephalopathy-slowly improving, patient  developed confusion in the  hospital, likely delirium.  She has no new focal neurological deficits.  CT head obtained on 03/28/2020 showed stable remote infarcts including right MCA territory infarct.  No other acute abnormality.  Likely has vascular dementia at baseline.  Continue delirium precautions. 6. Hypertension-blood pressures controlled, continue lisinopril. 7. Diabetes mellitus type 2-Metformin on hold, CBG well controlled.  Continue sensitive sliding scale insulin.  Will not add long-acting insulin at this time as patient blood sugars well controlled. 8. Hyperlipidemia-continue Lipitor. 9. History of left MCA stroke-she has residual right-sided weakness, continue home medication including Plavix, aspirin, Lipitor. 10. Peripheral neuropathy-likely secondary diabetes mellitus type 2.  Continue gabapentin.     COVID-19 Labs  No results for input(s): DDIMER, FERRITIN, LDH, CRP in the last 72 hours.  Lab Results  Component Value Date   SARSCOV2NAA POSITIVE (A) 03/28/2020     Scheduled medications:   . aspirin EC  81 mg Oral Daily  . atorvastatin  80 mg Oral Daily  . clopidogrel  75 mg Oral Daily  . enoxaparin (LOVENOX) injection  40 mg Subcutaneous Q24H  . ezetimibe  10 mg Oral Daily  . gabapentin  100 mg Oral TID  . insulin aspart  0-9 Units Subcutaneous TID WC  . lisinopril  10 mg Oral Daily  . melatonin  5 mg Oral QHS  . QUEtiapine  25 mg Oral QHS         CBG: Recent Labs  Lab 04/03/20 0820 04/03/20 1206 04/03/20 1629 04/03/20 2114 04/04/20 0806  GLUCAP 144* 141* 207* 134* 133*    SpO2: 91 % O2 Flow Rate (L/min): 2 L/min    CBC: Recent Labs  Lab 03/28/20 1521 03/29/20 0402 03/30/20 0445 03/31/20 0457  WBC 4.8 2.4* 3.5*  4.1  NEUTROABS 3.0 1.2* 2.1 2.5  HGB 13.7 13.6 13.2 13.3  HCT 41.8 41.1 39.2 39.5  MCV 92.7 91.5 90.7 91.6  PLT 186 187 211 [id number]    Basic Metabolic Panel: Recent Labs  Lab 03/29/20 0402 03/30/20 0445 03/31/20 0457 04/01/20 0430 04/02/20 0715  NA  143 141 140 138 139  K 3.6 4.3 3.9 3.3* 3.7  CL 104 106 103 103 104  CO2 26 27 25 24 24   GLUCOSE 182* 252* 239* 190* 162*  BUN 22 29* 29* 23 21  CREATININE 1.06* 1.08* 1.08* 0.87 1.04*  CALCIUM 8.5* 8.6* 8.5* 8.3* 8.4*  MG  --   --  2.1  --   --      Liver Function Tests: Recent Labs  Lab 03/28/20 2340 03/29/20 0402 03/30/20 0445 03/31/20 0457 04/01/20 0430  AST 26 25 25 24 28   ALT 18 20 18 17 20   ALKPHOS 79 84 75 80 82  BILITOT 0.6 0.7 0.5 0.6 0.8  PROT 6.4* 6.7 6.2* 6.3* 6.2*  ALBUMIN 3.0* 3.1* 2.9* 2.8* 2.8*     Antibiotics: Anti-infectives (From admission, onward)   Start     Dose/Rate Route Frequency Ordered Stop   04/02/20 1900  Levofloxacin (LEVAQUIN) IVPB 250 mg  Status:  Discontinued        250 mg 50 mL/hr over 60 Minutes Intravenous Every 24 hours 04/02/20 1129 04/02/20 1138   04/02/20 1800  levofloxacin (LEVAQUIN) tablet 250 mg  Status:  Discontinued        250 mg Oral Daily 04/02/20 1140 04/02/20 1421   04/02/20 1800  levofloxacin (LEVAQUIN) tablet 250 mg        250 mg Oral Daily 04/02/20 1421 04/02/20 1709   04/01/20 1900  levofloxacin (LEVAQUIN) IVPB 500 mg  Status:  Discontinued        500 mg 100 mL/hr over 60 Minutes Intravenous Every 24 hours 04/01/20 1457 04/02/20 1129   03/29/20 1900  Levofloxacin (LEVAQUIN) IVPB 250 mg  Status:  Discontinued        250 mg 50 mL/hr over 60 Minutes Intravenous Every 24 hours 03/29/20 1652 04/01/20 1457   03/29/20 1000  remdesivir 100 mg in sodium chloride 0.9 % 100 mL IVPB       "Followed by" Linked Group Details   100 mg 200 mL/hr over 30 Minutes Intravenous Daily 03/28/20 1512 04/01/20 0911   03/29/20 1000  remdesivir 100 mg in sodium chloride 0.9 % 100 mL IVPB  Status:  Discontinued       "Followed by" Linked Group Details   100 mg 200 mL/hr over 30 Minutes Intravenous Daily 03/28/20 1530 03/28/20 1531   03/28/20 1545  remdesivir 200 mg in sodium chloride 0.9% 250 mL IVPB  Status:  Discontinued        "Followed by" Linked Group Details   200 mg 580 mL/hr over 30 Minutes Intravenous Once 03/28/20 1530 03/28/20 1531   03/28/20 1515  remdesivir 200 mg in sodium chloride 0.9% 250 mL IVPB       "Followed by" Linked Group Details   200 mg 580 mL/hr over 30 Minutes Intravenous Once 03/28/20 1512 03/28/20 1905   03/28/20 1415  levofloxacin (LEVAQUIN) IVPB 750 mg        750 mg 100 mL/hr over 90 Minutes Intravenous  Once 03/28/20 1407 03/28/20 1808   03/28/20 1000  nitrofurantoin (macrocrystal-monohydrate) (MACROBID) capsule 100 mg  Status:  Discontinued        100 mg Oral  Every 12 hours 03/28/20 0441 03/29/20 1652   03/28/20 0230  nitrofurantoin (macrocrystal-monohydrate) (MACROBID) capsule 100 mg        100 mg Oral  Once 03/28/20 0229 03/28/20 0255       DVT prophylaxis: Lovenox  Code Status: Partial code/no CPR  Family Communication: Spoke to patient's daughter on phone.   Consultants:    Procedures:      Objective   Vitals:   04/03/20 1944 04/03/20 2300 04/04/20 0405 04/04/20 0732  BP: 127/86 125/79 124/81 130/76  Pulse: 91 87 97 100  Resp: 18 17 20 18   Temp: 98.1 F (36.7 C) 98 F (36.7 C) 98 F (36.7 C) 98.4 F (36.9 C)  TempSrc:      SpO2: 97% 98% 92% 91%  Weight:      Height:       No intake or output data in the 24 hours ending 04/04/20 1130  No intake/output data recorded.  Filed Weights   03/27/20 1319 03/28/20 2033  Weight: 64 kg 64 kg    Physical Examination:    General-appears in no acute distress  Heart-S1-S2, regular, no murmur auscultated  Lungs-clear to auscultation bilaterally, no wheezing or crackles auscultated  Abdomen-soft, nontender, no organomegaly  Extremities-no edema in the lower extremities  Neuro-alert, oriented to self only   Status is: Inpatient  Dispo: The patient is from: Home              Anticipated d/c is to: Skilled nursing facility              Anticipated d/c date is: 04/04/2020              Patient  currently not stable for discharge  Barrier to discharge-ongoing management for multifocal pneumonia       Data Reviewed:   Recent Results (from the past 240 hour(s))  Urine Culture     Status: Abnormal   Collection Time: 03/27/20  4:14 PM   Specimen: Urine, Random  Result Value Ref Range Status   Specimen Description   Final    URINE, RANDOM Performed at [facility], [address] [place], [place] [address]    Special Requests   Final    NONE Performed at [facility], [address] [place], [place] [address]    Culture MULTIPLE SPECIES PRESENT, SUGGEST RECOLLECTION (A)  Final   Report Status 03/29/2020 FINAL  Final  SARS CORONAVIRUS 2 (TAT 6-24 HRS) Nasopharyngeal Nasopharyngeal Swab     Status: Abnormal   Collection Time: 03/28/20  2:59 AM   Specimen: Nasopharyngeal Swab  Result Value Ref Range Status   SARS Coronavirus 2 POSITIVE (A) NEGATIVE Final    Comment: (NOTE) SARS-CoV-2 target nucleic acids are DETECTED.  The SARS-CoV-2 RNA is generally detectable in upper and lower respiratory specimens during the acute phase of infection. Positive results are indicative of the presence of SARS-CoV-2 RNA. Clinical correlation with patient history and other diagnostic information is  necessary to determine patient infection status. Positive results do not rule out bacterial infection or co-infection with other viruses.  The expected result is Negative.  Fact Sheet for Patients: SugarRoll.be  Fact Sheet for Healthcare Providers: https://www.woods-mathews.com/  This test is not yet approved or cleared by the [place] FDA and  has been authorized for detection and/or diagnosis of SARS-CoV-2 by FDA under an Emergency Use Authorization (EUA). This EUA will remain  in effect (meaning this test can be used) for the duration of the  COVID-19 declaration under Section 564(b)(1) of the Act, 21 U. S.C. section  360bbb-3(b)(1), unless the authorization is terminated or revoked sooner.   Performed at [facility], [place] [address], [place], [place] [address]   Blood Culture (routine x 2)     Status: None   Collection Time: 03/28/20  4:06 PM   Specimen: BLOOD  Result Value Ref Range Status   Specimen Description BLOOD BLOOD LEFT WRIST  Final   Special Requests   Final    BOTTLES DRAWN AEROBIC AND ANAEROBIC Blood Culture adequate volume   Culture   Final    NO GROWTH 5 DAYS Performed at [facility], [address], [place], [place] [address]    Report Status 04/02/2020 FINAL  Final  Blood Culture (routine x 2)     Status: Abnormal   Collection Time: 03/28/20  4:06 PM   Specimen: BLOOD  Result Value Ref Range Status   Specimen Description   Final    BLOOD LEFT ANTECUBITAL Performed at [facility], [address] [place], [place] [address]    Special Requests   Final    BOTTLES DRAWN AEROBIC AND ANAEROBIC Blood Culture adequate volume Performed at [facility], [address] [place], [place] [address]    Culture  Setup Time   Final    Organism ID to follow [place] ONLY CRITICAL RESULT CALLED TO, READ BACK BY AND VERIFIED WITH: NATHAN BELUE AT [place] 03/30/20. MF Performed at [facility], [address] [place], [place] [address]    Culture (A)  Final    STAPHYLOCOCCUS AURICULARIS THE SIGNIFICANCE OF ISOLATING THIS ORGANISM FROM A SINGLE SET OF BLOOD CULTURES WHEN MULTIPLE SETS ARE DRAWN IS UNCERTAIN. PLEASE NOTIFY THE MICROBIOLOGY DEPARTMENT WITHIN ONE WEEK IF SPECIATION AND SENSITIVITIES ARE REQUIRED. Performed at [facility], [place] [address] [place], [place] [address]    Report Status 04/01/2020 FINAL  Final  Blood Culture ID Panel (Reflexed)     Status: Abnormal   Collection Time: 03/28/20  4:06 PM  Result Value Ref Range Status   Enterococcus faecalis NOT DETECTED NOT DETECTED Final    Enterococcus Faecium NOT DETECTED NOT DETECTED Final   Listeria monocytogenes NOT DETECTED NOT DETECTED Final   Staphylococcus species DETECTED (A) NOT DETECTED Final    Comment: CRITICAL RESULT CALLED TO, READ BACK BY AND VERIFIED WITH: NATHAN BELUE AT 0105 03/30/20. MF    Staphylococcus aureus (BCID) NOT DETECTED NOT DETECTED Final   Staphylococcus epidermidis NOT DETECTED NOT DETECTED Final   Staphylococcus lugdunensis NOT DETECTED NOT DETECTED Final   Streptococcus species NOT DETECTED NOT DETECTED Final   Streptococcus agalactiae NOT DETECTED NOT DETECTED Final   Streptococcus pneumoniae NOT DETECTED NOT DETECTED Final   Streptococcus pyogenes NOT DETECTED NOT DETECTED Final   A.calcoaceticus-baumannii NOT DETECTED NOT DETECTED Final   Bacteroides fragilis NOT DETECTED NOT DETECTED Final   Enterobacterales NOT DETECTED NOT DETECTED Final   Enterobacter cloacae complex NOT DETECTED NOT DETECTED Final   Escherichia coli NOT DETECTED NOT DETECTED Final   Klebsiella aerogenes NOT DETECTED NOT DETECTED Final   Klebsiella oxytoca NOT DETECTED NOT DETECTED Final   Klebsiella pneumoniae NOT DETECTED NOT DETECTED Final   Proteus species NOT DETECTED NOT DETECTED Final   Salmonella species NOT DETECTED NOT DETECTED Final   Serratia marcescens NOT DETECTED NOT DETECTED Final   Haemophilus influenzae NOT DETECTED NOT DETECTED Final   Neisseria meningitidis NOT DETECTED NOT DETECTED  Final   Pseudomonas aeruginosa NOT DETECTED NOT DETECTED Final   Stenotrophomonas maltophilia NOT DETECTED NOT DETECTED Final   Candida albicans NOT DETECTED NOT DETECTED Final   Candida auris NOT DETECTED NOT DETECTED Final   Candida glabrata NOT DETECTED NOT DETECTED Final   Candida krusei NOT DETECTED NOT DETECTED Final   Candida parapsilosis NOT DETECTED NOT DETECTED Final   Candida tropicalis NOT DETECTED NOT DETECTED Final   Cryptococcus neoformans/gattii NOT DETECTED NOT DETECTED Final    Comment:  Performed at [facility], [address] [place], [place] [address]    No results for input(s): LIPASE, AMYLASE in the last 168 hours. No results for input(s): AMMONIA in the last 168 hours.  Cardiac Enzymes: No results for input(s): CKTOTAL, CKMB, CKMBINDEX, TROPONINI in the last 168 hours. BNP (last 3 results) No results for input(s): BNP in the last 8760 hours.  ProBNP (last 3 results) No results for input(s): PROBNP in the last 8760 hours.  Studies:  No results found.     Oswald Hillock   [facility] If 7PM-7AM, please contact night-coverage at [email or web address], Office  [phone]   04/04/2020, 11:30 AM  LOS: 6 days

## 2020-04-04 NOTE — TOC Progression Note (Signed)
Transition of Care [facility]) - Progression Note    Patient Details  Name: Erin Hamilton MRN: [id number] Date of Birth: 08/31/1950  Transition of Care [facility]) CM/SW Contact  Shelbie Hutching, RN Phone Number: 04/04/2020, 10:49 AM  Clinical Narrative:    [facility] offered a bed but patient would not be able to discharge to [facility] until 2/6.  Message left for daughter [name] for return call to see if she would like to accept the bed offer.    Expected Discharge Plan: [place] Barriers to Discharge: Continued Medical Work up  Expected Discharge Plan and Services Expected Discharge Plan: [place]   Discharge Planning Services: CM Consult Post Acute Care Choice: [place] Living arrangements for the past 2 months: Single Family Home                                       Social Determinants of Health (SDOH) Interventions    Readmission Risk Interventions No flowsheet data found.

## 2020-04-05 DIAGNOSIS — R531 Weakness: Secondary | ICD-10-CM | POA: Diagnosis not present

## 2020-04-05 DIAGNOSIS — I10 Essential (primary) hypertension: Secondary | ICD-10-CM | POA: Diagnosis not present

## 2020-04-05 DIAGNOSIS — U071 COVID-19: Secondary | ICD-10-CM | POA: Diagnosis not present

## 2020-04-05 DIAGNOSIS — I639 Cerebral infarction, unspecified: Secondary | ICD-10-CM | POA: Diagnosis not present

## 2020-04-05 LAB — CBC
HCT: 40.4 % (ref 36.0–46.0)
Hemoglobin: 13.5 g/dL (ref 12.0–15.0)
MCH: 30.1 pg (ref 26.0–34.0)
MCHC: 33.4 g/dL (ref 30.0–36.0)
MCV: 90.2 fL (ref 80.0–100.0)
Platelets: 307 10*3/uL (ref 150–400)
RBC: 4.48 MIL/uL (ref 3.87–5.11)
RDW: 13.1 % (ref 11.5–15.5)
WBC: 6.2 10*3/uL (ref 4.0–10.5)
nRBC: 0 % (ref 0.0–0.2)

## 2020-04-05 LAB — BASIC METABOLIC PANEL
Anion gap: 7 (ref 5–15)
BUN: 18 mg/dL (ref 8–23)
CO2: 25 mmol/L (ref 22–32)
Calcium: 8.4 mg/dL — ABNORMAL LOW (ref 8.9–10.3)
Chloride: 104 mmol/L (ref 98–111)
Creatinine, Ser: 1.09 mg/dL — ABNORMAL HIGH (ref 0.44–1.00)
GFR, Estimated: 55 mL/min — ABNORMAL LOW (ref >60)
Glucose, Bld: 167 mg/dL — ABNORMAL HIGH (ref 70–99)
Potassium: 3.6 mmol/L (ref 3.5–5.1)
Sodium: 136 mmol/L (ref 135–145)

## 2020-04-05 LAB — GLUCOSE, CAPILLARY
Glucose-Capillary: 154 mg/dL — ABNORMAL HIGH (ref 70–99)
Glucose-Capillary: 138 mg/dL — ABNORMAL HIGH (ref 70–99)
Glucose-Capillary: 157 mg/dL — ABNORMAL HIGH (ref 70–99)
Glucose-Capillary: 164 mg/dL — ABNORMAL HIGH (ref 70–99)

## 2020-04-05 MED ORDER — ENSURE ENLIVE PO LIQD
237.0000 mL | Freq: Two times a day (BID) | ORAL | Status: DC
  Administered 2020-04-05 – 2020-04-09 (×8): 237 mL via ORAL

## 2020-04-05 NOTE — Progress Notes (Signed)
Initial Nutrition Assessment  DOCUMENTATION CODES:   Not applicable  INTERVENTION:  Ensure Enlive po BID, each supplement provides 350 kcal and 20 grams of protein  Weekly weights ordered  No documented BM since 1/30, consider scheduled bowel regimen  NUTRITION DIAGNOSIS:   Increased nutrient needs related to catabolic illness (pneumonia due to COVID-19 virus) as evidenced by estimated needs.   GOAL:   Patient will meet greater than or equal to 90% of their needs    MONITOR:   PO intake,Supplement acceptance,Weight trends,Labs,I & O's,Skin  REASON FOR ASSESSMENT:   Malnutrition Screening Tool    ASSESSMENT:  70 year old with past medical history of advanced dementia, failure to thrive, prior stoke (Sept 2019), HLD, DM2, HTN, who presents with ambulatory difficulties with frequent falls and weakness over the past 5 days. Pt admitted with pneumonia secondary to COVID-19 virus.  1/28-admit  Pt developed worsening confusion in hospital, likely delirium, noted slowly improving. There are no meals for review at this time, suspect decreased appetite and intake given confusion. Will order Ensure Enlive to help her meet her needs. Given history of DM2, will continue to monitor CBGs, adequacy of intake, and will adjust supplement regimen as appropriate at follow-up.   Admit wt 64 kg on 1/27 appears stable over the last 10 months. No new weights this admission to trend. Will order weekly weight.  No documented BM since 1/30  Plans to discharge to SNF when medically stable. Medications reviewed and include: Gabapentin, SSI, Zestril, Melatonin  Labs: CBGs [phone] A1c 8.1 (H) on 03/29/00   NUTRITION - FOCUSED PHYSICAL EXAM: Unable to complete at this time, RD working remotely.  Diet Order:   Diet Order            Diet Heart Room service appropriate? Yes; Fluid consistency: Thin  Diet effective now                 EDUCATION NEEDS:   No education needs have  been identified at this time  Skin:  Skin Assessment: Reviewed RN Assessment  Last BM:  1/30  Height:   Ht Readings from Last 1 Encounters:  03/28/20 5\' 5"  (1.651 m)    Weight:   Wt Readings from Last 1 Encounters:  03/28/20 64 kg    BMI:  Body mass index is 23.48 kg/m.  Estimated Nutritional Needs:   Kcal:  8413-2440  Protein:  96-110  Fluid:  1.6 L   Lajuan Lines, RD, LDN Clinical Nutrition After Hours/Weekend Pager # in [place]

## 2020-04-05 NOTE — Care Management Important Message (Signed)
Important Message  Patient Details  Name: Erin Hamilton MRN: [id number] Date of Birth: January 06, 1951   Medicare Important Message Given:  Other (see comment)  Patient is in an isolation room so called to review the Important Message from Medicare with her but there was no answer. Will try calling later.   2:20 pm - Tried calling patient in her room but no answer. RN CM has been discussing disposition with daughter, Shannon Balthazar at [phone].  I reviewed the Important Message from Medicare (IM) and she is in agreement with the upcoming discharge plan.  I asked is she would like me to mail a copy of the IM and she said yes.  Reviewed the address on file and she said address was in [place], [place] [address] so updated in the system. IM will be sent via certified mail.  Juliann Pulse A [place] Korver 04/05/2020, 12:09 PM

## 2020-04-05 NOTE — Progress Notes (Signed)
[facility]  PROGRESS NOTE  Erin Hamilton [id number] DOB: Feb 15, 1951 DOA: 03/28/2020 PCP: Kirk Ruths, MD   Brief HPI:   70 year old female with medical history of dementia, failure to thrive, prior history of stroke in September 2019 with residual right-sided weakness, hyperlipidemia, diabetes mellitus type 2, hypertension presented to ED on 03/28/2020 via EMS with chief complaints of weakness and frequent falls.  As per daughter she was having difficulty walking for past 5 days.  In the ED she was found to be hypoxemic with O2 sats 86 to 89% on room air, requiring 2 L/min of oxygen.  COVID-19 PCR was positive.  Chest x-ray showed patchy interstitial opacities in both lung bases reflecting multifocal COVID-19 pneumonia.  She was started on Solu-Medrol, remdesivir.  Vaccination status-unvaccinated  Date of positive Covid test-03/28/2020  Subjective   Patient seen and examined, denies any complaints.  Awaiting bed at skilled nursing facility.   Assessment/Plan:     1. Acute respiratory failure with hypoxemia-secondary to multifocal COVID-19 pneumonia with superimposed bacterial infection.  Patient was treated with remdesivir, steroids, antitussives and bronchodilators.  She also received Levaquin for concern for secondary bacterial pneumonia.  Currently she is requiring 2 L/min of oxygen, wean off oxygen as tolerated. 2. General debility/failure to thrive-PT/OT consulted, plan to go to skilled nursing facility when bed becomes available.. 3. ?  UTI-patient presented with suprapubic tenderness and UA consistent with UTI.  Initial urine culture grew multiple species.  Patient was treated with empiric Levaquin for 5 days for pneumonia as above. 4. Positive blood culture-patient blood culture grew 1 out of 4 bottles staph auricularis, likely contamination.  Patient was treated with empiric Levaquin.   5. Acute metabolic encephalopathy-slowly improving, patient  developed  confusion in the hospital, likely delirium.  She has no new focal neurological deficits.  CT head obtained on 03/28/2020 showed stable remote infarcts including right MCA territory infarct.  No other acute abnormality.  Likely has vascular dementia at baseline.  Continue delirium precautions. 6. Hypertension-blood pressures controlled, continue lisinopril. 7. Diabetes mellitus type 2-Metformin on hold, CBG well controlled.  Continue sensitive sliding scale insulin.  Will not add long-acting insulin at this time as patient blood sugars well controlled. 8. Hyperlipidemia-continue Lipitor. 9. History of left MCA stroke-she has residual right-sided weakness, continue home medication including Plavix, aspirin, Lipitor. 10. Peripheral neuropathy-likely secondary diabetes mellitus type 2.  Continue gabapentin.     COVID-19 Labs  No results for input(s): DDIMER, FERRITIN, LDH, CRP in the last 72 hours.  Lab Results  Component Value Date   SARSCOV2NAA POSITIVE (A) 03/28/2020     Scheduled medications:   . aspirin EC  81 mg Oral Daily  . atorvastatin  80 mg Oral Daily  . clopidogrel  75 mg Oral Daily  . enoxaparin (LOVENOX) injection  40 mg Subcutaneous Q24H  . ezetimibe  10 mg Oral Daily  . feeding supplement  237 mL Oral BID BM  . gabapentin  100 mg Oral TID  . insulin aspart  0-9 Units Subcutaneous TID WC  . lisinopril  10 mg Oral Daily  . melatonin  5 mg Oral QHS  . QUEtiapine  25 mg Oral QHS         CBG: Recent Labs  Lab 04/04/20 1206 04/04/20 1623 04/04/20 2120 04/05/20 0817 04/05/20 1229  GLUCAP 112* 202* 165* 154* 138*    SpO2: 96 % O2 Flow Rate (L/min): 2 L/min    CBC: Recent Labs  Lab 03/30/20 0445 03/31/20  0457 04/05/20 0434  WBC 3.5* 4.1 6.2  NEUTROABS 2.1 2.5  --   HGB 13.2 13.3 13.5  HCT 39.2 39.5 40.4  MCV 90.7 91.6 90.2  PLT 211 209 [id number]    Basic Metabolic Panel: Recent Labs  Lab 03/30/20 0445 03/31/20 0457 04/01/20 0430 04/02/20 0715  04/05/20 0434  NA 141 140 138 139 136  K 4.3 3.9 3.3* 3.7 3.6  CL 106 103 103 104 104  CO2 27 25 24 24 25   GLUCOSE 252* 239* 190* 162* 167*  BUN 29* 29* 23 21 18   CREATININE 1.08* 1.08* 0.87 1.04* 1.09*  CALCIUM 8.6* 8.5* 8.3* 8.4* 8.4*  MG  --  2.1  --   --   --      Liver Function Tests: Recent Labs  Lab 03/30/20 0445 03/31/20 0457 04/01/20 0430  AST 25 24 28   ALT 18 17 20   ALKPHOS 75 80 82  BILITOT 0.5 0.6 0.8  PROT 6.2* 6.3* 6.2*  ALBUMIN 2.9* 2.8* 2.8*     Antibiotics: Anti-infectives (From admission, onward)   Start     Dose/Rate Route Frequency Ordered Stop   04/02/20 1900  Levofloxacin (LEVAQUIN) IVPB 250 mg  Status:  Discontinued        250 mg 50 mL/hr over 60 Minutes Intravenous Every 24 hours 04/02/20 1129 04/02/20 1138   04/02/20 1800  levofloxacin (LEVAQUIN) tablet 250 mg  Status:  Discontinued        250 mg Oral Daily 04/02/20 1140 04/02/20 1421   04/02/20 1800  levofloxacin (LEVAQUIN) tablet 250 mg        250 mg Oral Daily 04/02/20 1421 04/02/20 1709   04/01/20 1900  levofloxacin (LEVAQUIN) IVPB 500 mg  Status:  Discontinued        500 mg 100 mL/hr over 60 Minutes Intravenous Every 24 hours 04/01/20 1457 04/02/20 1129   03/29/20 1900  Levofloxacin (LEVAQUIN) IVPB 250 mg  Status:  Discontinued        250 mg 50 mL/hr over 60 Minutes Intravenous Every 24 hours 03/29/20 1652 04/01/20 1457   03/29/20 1000  remdesivir 100 mg in sodium chloride 0.9 % 100 mL IVPB       "Followed by" Linked Group Details   100 mg 200 mL/hr over 30 Minutes Intravenous Daily 03/28/20 1512 04/01/20 0911   03/29/20 1000  remdesivir 100 mg in sodium chloride 0.9 % 100 mL IVPB  Status:  Discontinued       "Followed by" Linked Group Details   100 mg 200 mL/hr over 30 Minutes Intravenous Daily 03/28/20 1530 03/28/20 1531   03/28/20 1545  remdesivir 200 mg in sodium chloride 0.9% 250 mL IVPB  Status:  Discontinued       "Followed by" Linked Group Details   200 mg 580 mL/hr over 30  Minutes Intravenous Once 03/28/20 1530 03/28/20 1531   03/28/20 1515  remdesivir 200 mg in sodium chloride 0.9% 250 mL IVPB       "Followed by" Linked Group Details   200 mg 580 mL/hr over 30 Minutes Intravenous Once 03/28/20 1512 03/28/20 1905   03/28/20 1415  levofloxacin (LEVAQUIN) IVPB 750 mg        750 mg 100 mL/hr over 90 Minutes Intravenous  Once 03/28/20 1407 03/28/20 1808   03/28/20 1000  nitrofurantoin (macrocrystal-monohydrate) (MACROBID) capsule 100 mg  Status:  Discontinued        100 mg Oral Every 12 hours 03/28/20 0441 03/29/20 1652   03/28/20 0230  nitrofurantoin (macrocrystal-monohydrate) (MACROBID) capsule 100 mg        100 mg Oral  Once 03/28/20 0229 03/28/20 0255       DVT prophylaxis: Lovenox  Code Status: Partial code/no CPR  Family Communication: Spoke to patient's daughter on phone.   Consultants:    Procedures:      Objective   Vitals:   04/05/20 0009 04/05/20 0359 04/05/20 0816 04/05/20 1214  BP: (!) 86/48 106/69 110/70 (!) 90/58  Pulse: 88 95 92 92  Resp: 18 16 16 16   Temp: 97.8 F (36.6 C) 97.8 F (36.6 C) 98.2 F (36.8 C) 98.4 F (36.9 C)  TempSrc:    Oral  SpO2: 95% 94% 95% 96%  Weight:      Height:       No intake or output data in the 24 hours ending 04/05/20 1511  No intake/output data recorded.  Filed Weights   03/27/20 1319 03/28/20 2033  Weight: 64 kg 64 kg    Physical Examination:    General-appears in no acute distress  Heart-S1-S2, regular, no murmur auscultated  Lungs-clear to auscultation bilaterally, no wheezing or crackles auscultated  Abdomen-soft, nontender, no organomegaly  Extremities-no edema in the lower extremities  Neuro-alert, oriented x3, no focal deficit noted   Status is: Inpatient  Dispo: The patient is from: Home              Anticipated d/c is to: Skilled nursing facility              Anticipated d/c date is: 04/04/2020              Patient currently not stable for  discharge  Barrier to discharge-ongoing management for multifocal pneumonia       Data Reviewed:   Recent Results (from the past 240 hour(s))  Urine Culture     Status: Abnormal   Collection Time: 03/27/20  4:14 PM   Specimen: Urine, Random  Result Value Ref Range Status   Specimen Description   Final    URINE, RANDOM Performed at [facility], [address] [place], [place] [address]    Special Requests   Final    NONE Performed at [facility], [address] [place], [place] [address]    Culture MULTIPLE SPECIES PRESENT, SUGGEST RECOLLECTION (A)  Final   Report Status 03/29/2020 FINAL  Final  SARS CORONAVIRUS 2 (TAT 6-24 HRS) Nasopharyngeal Nasopharyngeal Swab     Status: Abnormal   Collection Time: 03/28/20  2:59 AM   Specimen: Nasopharyngeal Swab  Result Value Ref Range Status   SARS Coronavirus 2 POSITIVE (A) NEGATIVE Final    Comment: (NOTE) SARS-CoV-2 target nucleic acids are DETECTED.  The SARS-CoV-2 RNA is generally detectable in upper and lower respiratory specimens during the acute phase of infection. Positive results are indicative of the presence of SARS-CoV-2 RNA. Clinical correlation with patient history and other diagnostic information is  necessary to determine patient infection status. Positive results do not rule out bacterial infection or co-infection with other viruses.  The expected result is Negative.  Fact Sheet for Patients: SugarRoll.be  Fact Sheet for Healthcare Providers: https://www.woods-mathews.com/  This test is not yet approved or cleared by the [place] FDA and  has been authorized for detection and/or diagnosis of SARS-CoV-2 by FDA under an Emergency Use Authorization (EUA). This EUA will remain  in effect (meaning this test can be used) for the duration of the COVID-19 declaration under Section 564(b)(1) of the Act,  21 U. S.C. section 360bbb-3(b)(1), unless the  authorization is terminated or revoked sooner.   Performed at [facility], [place] [address], [place], [place] [address]   Blood Culture (routine x 2)     Status: None   Collection Time: 03/28/20  4:06 PM   Specimen: BLOOD  Result Value Ref Range Status   Specimen Description BLOOD BLOOD LEFT WRIST  Final   Special Requests   Final    BOTTLES DRAWN AEROBIC AND ANAEROBIC Blood Culture adequate volume   Culture   Final    NO GROWTH 5 DAYS Performed at [facility], [address], [place], [place] [address]    Report Status 04/02/2020 FINAL  Final  Blood Culture (routine x 2)     Status: Abnormal   Collection Time: 03/28/20  4:06 PM   Specimen: BLOOD  Result Value Ref Range Status   Specimen Description   Final    BLOOD LEFT ANTECUBITAL Performed at [facility], [address] [place], [place] [address]    Special Requests   Final    BOTTLES DRAWN AEROBIC AND ANAEROBIC Blood Culture adequate volume Performed at [facility], [address] [place], [place] [address]    Culture  Setup Time   Final    Organism ID to follow March ARB ONLY CRITICAL RESULT CALLED TO, READ BACK BY AND VERIFIED WITH: NATHAN BELUE AT [place] 03/30/20. MF Performed at [facility], [address] [place], [place] [address]    Culture (A)  Final    STAPHYLOCOCCUS AURICULARIS THE SIGNIFICANCE OF ISOLATING THIS ORGANISM FROM A SINGLE SET OF BLOOD CULTURES WHEN MULTIPLE SETS ARE DRAWN IS UNCERTAIN. PLEASE NOTIFY THE MICROBIOLOGY DEPARTMENT WITHIN ONE WEEK IF SPECIATION AND SENSITIVITIES ARE REQUIRED. Performed at [facility], [place] [address] [place], [place] [address]    Report Status 04/01/2020 FINAL  Final  Blood Culture ID Panel (Reflexed)     Status: Abnormal   Collection Time: 03/28/20  4:06 PM  Result Value Ref Range Status   Enterococcus faecalis NOT DETECTED NOT DETECTED Final   Enterococcus Faecium NOT DETECTED NOT  DETECTED Final   Listeria monocytogenes NOT DETECTED NOT DETECTED Final   Staphylococcus species DETECTED (A) NOT DETECTED Final    Comment: CRITICAL RESULT CALLED TO, READ BACK BY AND VERIFIED WITH: NATHAN BELUE AT 0105 03/30/20. MF    Staphylococcus aureus (BCID) NOT DETECTED NOT DETECTED Final   Staphylococcus epidermidis NOT DETECTED NOT DETECTED Final   Staphylococcus lugdunensis NOT DETECTED NOT DETECTED Final   Streptococcus species NOT DETECTED NOT DETECTED Final   Streptococcus agalactiae NOT DETECTED NOT DETECTED Final   Streptococcus pneumoniae NOT DETECTED NOT DETECTED Final   Streptococcus pyogenes NOT DETECTED NOT DETECTED Final   A.calcoaceticus-baumannii NOT DETECTED NOT DETECTED Final   Bacteroides fragilis NOT DETECTED NOT DETECTED Final   Enterobacterales NOT DETECTED NOT DETECTED Final   Enterobacter cloacae complex NOT DETECTED NOT DETECTED Final   Escherichia coli NOT DETECTED NOT DETECTED Final   Klebsiella aerogenes NOT DETECTED NOT DETECTED Final   Klebsiella oxytoca NOT DETECTED NOT DETECTED Final   Klebsiella pneumoniae NOT DETECTED NOT DETECTED Final   Proteus species NOT DETECTED NOT DETECTED Final   Salmonella species NOT DETECTED NOT DETECTED Final   Serratia marcescens NOT DETECTED NOT DETECTED Final   Haemophilus influenzae NOT DETECTED NOT DETECTED Final   Neisseria meningitidis NOT DETECTED NOT DETECTED Final   Pseudomonas aeruginosa NOT DETECTED NOT  DETECTED Final   Stenotrophomonas maltophilia NOT DETECTED NOT DETECTED Final   Candida albicans NOT DETECTED NOT DETECTED Final   Candida auris NOT DETECTED NOT DETECTED Final   Candida glabrata NOT DETECTED NOT DETECTED Final   Candida krusei NOT DETECTED NOT DETECTED Final   Candida parapsilosis NOT DETECTED NOT DETECTED Final   Candida tropicalis NOT DETECTED NOT DETECTED Final   Cryptococcus neoformans/gattii NOT DETECTED NOT DETECTED Final    Comment: Performed at [facility] [address], [place], [place] [address]         Oswald Hillock   [facility] If 7PM-7AM, please contact night-coverage at [email or web address], Office  [phone]   04/05/2020, 3:11 PM  LOS: 7 days

## 2020-04-05 NOTE — TOC Progression Note (Signed)
Transition of Care [facility]) - Progression Note    Patient Details  Name: Erin Hamilton MRN: [id number] Date of Birth: [birth year]  [facility] CM/SW Contact  Shelbie Hutching, RN Phone Number: 04/05/2020, 2:49 PM  Clinical Narrative:    Patient will be able to come off of isolation precautions on 2/6.  Patient will be able to go to [facility] on 2/7.  [name] patient's daughter is aware of and agrees with discharge plan.    Expected Discharge Plan: [place] Barriers to Discharge: Continued Medical Work up  Expected Discharge Plan and Services Expected Discharge Plan: [place]   Discharge Planning Services: CM Consult Post Acute Care Choice: [place] Living arrangements for the past 2 months: Single Family Home                                       Social Determinants of Health (SDOH) Interventions    Readmission Risk Interventions No flowsheet data found.

## 2020-04-06 DIAGNOSIS — J1282 Pneumonia due to coronavirus disease 2019: Secondary | ICD-10-CM | POA: Diagnosis not present

## 2020-04-06 DIAGNOSIS — U071 COVID-19: Secondary | ICD-10-CM | POA: Diagnosis not present

## 2020-04-06 LAB — GLUCOSE, CAPILLARY
Glucose-Capillary: 131 mg/dL — ABNORMAL HIGH (ref 70–99)
Glucose-Capillary: 270 mg/dL — ABNORMAL HIGH (ref 70–99)
Glucose-Capillary: 184 mg/dL — ABNORMAL HIGH (ref 70–99)
Glucose-Capillary: 152 mg/dL — ABNORMAL HIGH (ref 70–99)

## 2020-04-06 NOTE — Progress Notes (Signed)
[facility]  PROGRESS NOTE  Erin Hamilton [id number] DOB: [birth year] DOA: 03/28/2020 PCP: Kirk Ruths, MD   Brief HPI:   70 year old female with medical history of dementia, failure to thrive, prior history of stroke in September 2019 with residual right-sided weakness, hyperlipidemia, diabetes mellitus type 2, hypertension presented to ED on 03/28/2020 via EMS with chief complaints of weakness and frequent falls.  As per daughter she was having difficulty walking for past 5 days.  In the ED she was found to be hypoxemic with O2 sats 86 to 89% on room air, requiring 2 L/min of oxygen.  COVID-19 PCR was positive.  Chest x-ray showed patchy interstitial opacities in both lung bases reflecting multifocal COVID-19 pneumonia.  She was started on Solu-Medrol, remdesivir.  Vaccination status-unvaccinated  Date of positive Covid test-03/28/2020  Subjective   Patient seen and examined, no new complaints.  Awaiting bed at skilled nursing facility.   Assessment/Plan:     1. Acute respiratory failure with hypoxemia-secondary to multifocal COVID-19 pneumonia with superimposed bacterial infection.  Patient was treated with remdesivir, steroids, antitussives and bronchodilators.  She also received Levaquin for concern for secondary bacterial pneumonia.  Currently she is requiring 1 L/min of oxygen, wean off oxygen as tolerated. 2. General debility/failure to thrive-PT/OT consulted, plan to go to skilled nursing facility when bed becomes available.. 3. ?  UTI-patient presented with suprapubic tenderness and UA consistent with UTI.  Initial urine culture grew multiple species.  Patient was treated with empiric Levaquin for 5 days for pneumonia as above. 4. Positive blood culture-patient blood culture grew 1 out of 4 bottles staph auricularis, likely contamination.  Patient was treated with empiric Levaquin.   5. Acute metabolic encephalopathy-slowly improving, patient  developed confusion in  the hospital, likely delirium.  She has no new focal neurological deficits.  CT head obtained on 03/28/2020 showed stable remote infarcts including right MCA territory infarct.  No other acute abnormality.  Likely has vascular dementia at baseline.  Continue delirium precautions. 6. Hypertension-blood pressures controlled, continue lisinopril. 7. Diabetes mellitus type 2-Metformin on hold, CBG well controlled.  Continue sensitive sliding scale insulin.  Will not add long-acting insulin at this time as patient blood sugars well controlled. 8. Hyperlipidemia-continue Lipitor. 9. History of left MCA stroke-she has residual right-sided weakness, continue home medication including Plavix, aspirin, Lipitor. 10. Peripheral neuropathy-likely secondary diabetes mellitus type 2.  Continue gabapentin.     COVID-19 Labs  No results for input(s): DDIMER, FERRITIN, LDH, CRP in the last 72 hours.  Lab Results  Component Value Date   SARSCOV2NAA POSITIVE (A) 03/28/2020     Scheduled medications:   . aspirin EC  81 mg Oral Daily  . atorvastatin  80 mg Oral Daily  . clopidogrel  75 mg Oral Daily  . enoxaparin (LOVENOX) injection  40 mg Subcutaneous Q24H  . ezetimibe  10 mg Oral Daily  . feeding supplement  237 mL Oral BID BM  . gabapentin  100 mg Oral TID  . insulin aspart  0-9 Units Subcutaneous TID WC  . lisinopril  10 mg Oral Daily  . melatonin  5 mg Oral QHS  . QUEtiapine  25 mg Oral QHS         CBG: Recent Labs  Lab 04/05/20 1229 04/05/20 1636 04/05/20 2046 04/06/20 0731 04/06/20 1215  GLUCAP 138* 157* 164* 131* 270*    SpO2: 92 % O2 Flow Rate (L/min): 1 L/min    CBC: Recent Labs  Lab 03/31/20 0457 04/05/20  0434  WBC 4.1 6.2  NEUTROABS 2.5  --   HGB 13.3 13.5  HCT 39.5 40.4  MCV 91.6 90.2  PLT 209 [id number]    Basic Metabolic Panel: Recent Labs  Lab 03/31/20 0457 04/01/20 0430 04/02/20 0715 04/05/20 0434  NA 140 138 139 136  K 3.9 3.3* 3.7 3.6  CL 103 103 104  104  CO2 25 24 24 25   GLUCOSE 239* 190* 162* 167*  BUN 29* 23 21 18   CREATININE 1.08* 0.87 1.04* 1.09*  CALCIUM 8.5* 8.3* 8.4* 8.4*  MG 2.1  --   --   --      Liver Function Tests: Recent Labs  Lab 03/31/20 0457 04/01/20 0430  AST 24 28  ALT 17 20  ALKPHOS 80 82  BILITOT 0.6 0.8  PROT 6.3* 6.2*  ALBUMIN 2.8* 2.8*     Antibiotics: Anti-infectives (From admission, onward)   Start     Dose/Rate Route Frequency Ordered Stop   04/02/20 1900  Levofloxacin (LEVAQUIN) IVPB 250 mg  Status:  Discontinued        250 mg 50 mL/hr over 60 Minutes Intravenous Every 24 hours 04/02/20 1129 04/02/20 1138   04/02/20 1800  levofloxacin (LEVAQUIN) tablet 250 mg  Status:  Discontinued        250 mg Oral Daily 04/02/20 1140 04/02/20 1421   04/02/20 1800  levofloxacin (LEVAQUIN) tablet 250 mg        250 mg Oral Daily 04/02/20 1421 04/02/20 1709   04/01/20 1900  levofloxacin (LEVAQUIN) IVPB 500 mg  Status:  Discontinued        500 mg 100 mL/hr over 60 Minutes Intravenous Every 24 hours 04/01/20 1457 04/02/20 1129   03/29/20 1900  Levofloxacin (LEVAQUIN) IVPB 250 mg  Status:  Discontinued        250 mg 50 mL/hr over 60 Minutes Intravenous Every 24 hours 03/29/20 1652 04/01/20 1457   03/29/20 1000  remdesivir 100 mg in sodium chloride 0.9 % 100 mL IVPB       "Followed by" Linked Group Details   100 mg 200 mL/hr over 30 Minutes Intravenous Daily 03/28/20 1512 04/01/20 0911   03/29/20 1000  remdesivir 100 mg in sodium chloride 0.9 % 100 mL IVPB  Status:  Discontinued       "Followed by" Linked Group Details   100 mg 200 mL/hr over 30 Minutes Intravenous Daily 03/28/20 1530 03/28/20 1531   03/28/20 1545  remdesivir 200 mg in sodium chloride 0.9% 250 mL IVPB  Status:  Discontinued       "Followed by" Linked Group Details   200 mg 580 mL/hr over 30 Minutes Intravenous Once 03/28/20 1530 03/28/20 1531   03/28/20 1515  remdesivir 200 mg in sodium chloride 0.9% 250 mL IVPB       "Followed by"  Linked Group Details   200 mg 580 mL/hr over 30 Minutes Intravenous Once 03/28/20 1512 03/28/20 1905   03/28/20 1415  levofloxacin (LEVAQUIN) IVPB 750 mg        750 mg 100 mL/hr over 90 Minutes Intravenous  Once 03/28/20 1407 03/28/20 1808   03/28/20 1000  nitrofurantoin (macrocrystal-monohydrate) (MACROBID) capsule 100 mg  Status:  Discontinued        100 mg Oral Every 12 hours 03/28/20 0441 03/29/20 1652   03/28/20 0230  nitrofurantoin (macrocrystal-monohydrate) (MACROBID) capsule 100 mg        100 mg Oral  Once 03/28/20 0229 03/28/20 0255       DVT  prophylaxis: Lovenox  Code Status: Partial code/no CPR  Family Communication: Spoke to patient's daughter on phone.   Consultants:    Procedures:      Objective   Vitals:   04/05/20 2044 04/06/20 0416 04/06/20 0825 04/06/20 1219  BP: 119/79 95/60 99/74  115/67  Pulse: (!) 101 95 87 (!) 102  Resp: 17 17 18 18   Temp: (!) 97.3 F (36.3 C) 98 F (36.7 C) 98 F (36.7 C) 97.9 F (36.6 C)  TempSrc: Oral     SpO2: 92% (!) 86% 93% 92%  Weight:      Height:        Intake/Output Summary (Last 24 hours) at 04/06/2020 1508 Last data filed at 04/06/2020 0408 Gross per 24 hour  Intake --  Output 400 ml  Net -400 ml    02/03 1901 - 02/05 0700 In: -  Out: 400 [Urine:400]  Filed Weights   03/27/20 1319 03/28/20 2033  Weight: 64 kg 64 kg    Physical Examination:    General-appears in no acute distress  Heart-S1-S2, regular, no murmur auscultated  Lungs-clear to auscultation bilaterally, no wheezing or crackles auscultated  Abdomen-soft, nontender, no organomegaly  Extremities-no edema in the lower extremities  Neuro-alert, oriented to self   Status is: Inpatient  Dispo: The patient is from: Home              Anticipated d/c is to: Skilled nursing facility              Anticipated d/c date is: 04/04/2020              Patient currently not stable for discharge  Barrier to discharge-ongoing management for  multifocal pneumonia       Data Reviewed:   Recent Results (from the past 240 hour(s))  Urine Culture     Status: Abnormal   Collection Time: 03/27/20  4:14 PM   Specimen: Urine, Random  Result Value Ref Range Status   Specimen Description   Final    URINE, RANDOM Performed at [facility], [address] [place], [place] [address]    Special Requests   Final    NONE Performed at [facility], [address] [place], [place] [address]    Culture MULTIPLE SPECIES PRESENT, SUGGEST RECOLLECTION (A)  Final   Report Status 03/29/2020 FINAL  Final  SARS CORONAVIRUS 2 (TAT 6-24 HRS) Nasopharyngeal Nasopharyngeal Swab     Status: Abnormal   Collection Time: 03/28/20  2:59 AM   Specimen: Nasopharyngeal Swab  Result Value Ref Range Status   SARS Coronavirus 2 POSITIVE (A) NEGATIVE Final    Comment: (NOTE) SARS-CoV-2 target nucleic acids are DETECTED.  The SARS-CoV-2 RNA is generally detectable in upper and lower respiratory specimens during the acute phase of infection. Positive results are indicative of the presence of SARS-CoV-2 RNA. Clinical correlation with patient history and other diagnostic information is  necessary to determine patient infection status. Positive results do not rule out bacterial infection or co-infection with other viruses.  The expected result is Negative.  Fact Sheet for Patients: SugarRoll.be  Fact Sheet for Healthcare Providers: https://www.woods-mathews.com/  This test is not yet approved or cleared by the [place] FDA and  has been authorized for detection and/or diagnosis of SARS-CoV-2 by FDA under an Emergency Use Authorization (EUA). This EUA will remain  in effect (meaning this test can be used) for the duration of the COVID-19 declaration under Section 564(b)(1) of the Act, [place]  section 360bbb-3(b)(1), unless the authorization is terminated or revoked  sooner.   Performed at [facility], [place] [address], [place], [place] [address]   Blood Culture (routine x 2)     Status: None   Collection Time: 03/28/20  4:06 PM   Specimen: BLOOD  Result Value Ref Range Status   Specimen Description BLOOD BLOOD LEFT WRIST  Final   Special Requests   Final    BOTTLES DRAWN AEROBIC AND ANAEROBIC Blood Culture adequate volume   Culture   Final    NO GROWTH 5 DAYS Performed at [facility], [address], [place], [place] [address]    Report Status 04/02/2020 FINAL  Final  Blood Culture (routine x 2)     Status: Abnormal   Collection Time: 03/28/20  4:06 PM   Specimen: BLOOD  Result Value Ref Range Status   Specimen Description   Final    BLOOD LEFT ANTECUBITAL Performed at [facility], [address] [place], [place] [address]    Special Requests   Final    BOTTLES DRAWN AEROBIC AND ANAEROBIC Blood Culture adequate volume Performed at [facility], [address] [place], [place] [address]    Culture  Setup Time   Final    Organism ID to follow [place] ONLY CRITICAL RESULT CALLED TO, READ BACK BY AND VERIFIED WITH: NATHAN BELUE AT [place] 03/30/20. MF Performed at [facility], [address] [place], [place] [address]    Culture (A)  Final    STAPHYLOCOCCUS AURICULARIS THE SIGNIFICANCE OF ISOLATING THIS ORGANISM FROM A SINGLE SET OF BLOOD CULTURES WHEN MULTIPLE SETS ARE DRAWN IS UNCERTAIN. PLEASE NOTIFY THE MICROBIOLOGY DEPARTMENT WITHIN ONE WEEK IF SPECIATION AND SENSITIVITIES ARE REQUIRED. Performed at [facility], [place] [address], [place], [place] [address]    Report Status 04/01/2020 FINAL  Final  Blood Culture ID Panel (Reflexed)     Status: Abnormal   Collection Time: 03/28/20  4:06 PM  Result Value Ref Range Status   Enterococcus faecalis NOT DETECTED NOT DETECTED Final   Enterococcus Faecium NOT DETECTED NOT DETECTED Final   Listeria monocytogenes  NOT DETECTED NOT DETECTED Final   Staphylococcus species DETECTED (A) NOT DETECTED Final    Comment: CRITICAL RESULT CALLED TO, READ BACK BY AND VERIFIED WITH: NATHAN BELUE AT 0105 03/30/20. MF    Staphylococcus aureus (BCID) NOT DETECTED NOT DETECTED Final   Staphylococcus epidermidis NOT DETECTED NOT DETECTED Final   Staphylococcus lugdunensis NOT DETECTED NOT DETECTED Final   Streptococcus species NOT DETECTED NOT DETECTED Final   Streptococcus agalactiae NOT DETECTED NOT DETECTED Final   Streptococcus pneumoniae NOT DETECTED NOT DETECTED Final   Streptococcus pyogenes NOT DETECTED NOT DETECTED Final   A.calcoaceticus-baumannii NOT DETECTED NOT DETECTED Final   Bacteroides fragilis NOT DETECTED NOT DETECTED Final   Enterobacterales NOT DETECTED NOT DETECTED Final   Enterobacter cloacae complex NOT DETECTED NOT DETECTED Final   Escherichia coli NOT DETECTED NOT DETECTED Final   Klebsiella aerogenes NOT DETECTED NOT DETECTED Final   Klebsiella oxytoca NOT DETECTED NOT DETECTED Final   Klebsiella pneumoniae NOT DETECTED NOT DETECTED Final   Proteus species NOT DETECTED NOT DETECTED Final   Salmonella species NOT DETECTED NOT DETECTED Final   Serratia marcescens NOT DETECTED NOT DETECTED Final   Haemophilus influenzae NOT DETECTED NOT DETECTED Final   Neisseria meningitidis NOT DETECTED NOT DETECTED Final   Pseudomonas aeruginosa NOT DETECTED NOT DETECTED Final  Stenotrophomonas maltophilia NOT DETECTED NOT DETECTED Final   Candida albicans NOT DETECTED NOT DETECTED Final   Candida auris NOT DETECTED NOT DETECTED Final   Candida glabrata NOT DETECTED NOT DETECTED Final   Candida krusei NOT DETECTED NOT DETECTED Final   Candida parapsilosis NOT DETECTED NOT DETECTED Final   Candida tropicalis NOT DETECTED NOT DETECTED Final   Cryptococcus neoformans/gattii NOT DETECTED NOT DETECTED Final    Comment: Performed at [facility], [address] [place], [place] [address]          Oswald Hillock   [facility] If 7PM-7AM, please contact night-coverage at [email or web address], Office  [phone]   04/06/2020, 3:08 PM  LOS: 8 days

## 2020-04-07 DIAGNOSIS — U071 COVID-19: Secondary | ICD-10-CM | POA: Diagnosis not present

## 2020-04-07 DIAGNOSIS — G9341 Metabolic encephalopathy: Secondary | ICD-10-CM | POA: Diagnosis not present

## 2020-04-07 DIAGNOSIS — I639 Cerebral infarction, unspecified: Secondary | ICD-10-CM | POA: Diagnosis not present

## 2020-04-07 DIAGNOSIS — I10 Essential (primary) hypertension: Secondary | ICD-10-CM | POA: Diagnosis not present

## 2020-04-07 LAB — GLUCOSE, CAPILLARY
Glucose-Capillary: 140 mg/dL — ABNORMAL HIGH (ref 70–99)
Glucose-Capillary: 186 mg/dL — ABNORMAL HIGH (ref 70–99)
Glucose-Capillary: 180 mg/dL — ABNORMAL HIGH (ref 70–99)
Glucose-Capillary: 215 mg/dL — ABNORMAL HIGH (ref 70–99)

## 2020-04-07 NOTE — Plan of Care (Signed)
  Problem: Education: Goal: Knowledge of General Education information will improve Description Including pain rating scale, medication(s)/side effects and non-pharmacologic comfort measures Outcome: Progressing   

## 2020-04-07 NOTE — Progress Notes (Signed)
[facility]  PROGRESS NOTE  Erin Hamilton TIW:[id number] DOB: [birth year] DOA: 03/28/2020 PCP: Kirk Ruths, MD   Brief HPI:   70 year old female with medical history of dementia, failure to thrive, prior history of stroke in September 2019 with residual right-sided weakness, hyperlipidemia, diabetes mellitus type 2, hypertension presented to ED on 03/28/2020 via EMS with chief complaints of weakness and frequent falls.  As per daughter she was having difficulty walking for past 5 days.  In the ED she was found to be hypoxemic with O2 sats 86 to 89% on room air, requiring 2 L/min of oxygen.  COVID-19 PCR was positive.  Chest x-ray showed patchy interstitial opacities in both lung bases reflecting multifocal COVID-19 pneumonia.  She was started on Solu-Medrol, remdesivir.  Vaccination status-unvaccinated  Date of positive Covid test-03/28/2020  Subjective   Patient seen and examined, denies any complaints.   Assessment/Plan:     1. Acute respiratory failure with hypoxemia-secondary to multifocal COVID-19 pneumonia with superimposed bacterial infection.  Patient was treated with remdesivir, steroids, antitussives and bronchodilators.  She also received Levaquin for concern for secondary bacterial pneumonia.  Currently she is requiring 1 L/min of oxygen, wean off oxygen as tolerated. 2. General debility/failure to thrive-PT/OT consulted, plan to go to skilled nursing facility when bed becomes available.. 3. ?  UTI-patient presented with suprapubic tenderness and UA consistent with UTI.  Initial urine culture grew multiple species.  Patient was treated with empiric Levaquin for 5 days for pneumonia as above. 4. Positive blood culture-patient blood culture grew 1 out of 4 bottles staph auricularis, likely contamination.  Patient was treated with empiric Levaquin.   5. Acute metabolic encephalopathy-slowly improving, patient  developed confusion in the hospital, likely delirium.  She  has no new focal neurological deficits.  CT head obtained on 03/28/2020 showed stable remote infarcts including right MCA territory infarct.  No other acute abnormality.  Likely has vascular dementia at baseline.  Continue delirium precautions. 6. Hypertension-blood pressures controlled, continue lisinopril. 7. Diabetes mellitus type 2-Metformin on hold, CBG well controlled.  Continue sensitive sliding scale insulin.  Will not add long-acting insulin at this time as patient blood sugars well controlled. 8. Hyperlipidemia-continue Lipitor. 9. History of left MCA stroke-she has residual right-sided weakness, continue home medication including Plavix, aspirin, Lipitor. 10. Peripheral neuropathy-likely secondary diabetes mellitus type 2.  Continue gabapentin.     COVID-19 Labs  No results for input(s): DDIMER, FERRITIN, LDH, CRP in the last 72 hours.  Lab Results  Component Value Date   SARSCOV2NAA POSITIVE (A) 03/28/2020     Scheduled medications:   . aspirin EC  81 mg Oral Daily  . atorvastatin  80 mg Oral Daily  . clopidogrel  75 mg Oral Daily  . enoxaparin (LOVENOX) injection  40 mg Subcutaneous Q24H  . ezetimibe  10 mg Oral Daily  . feeding supplement  237 mL Oral BID BM  . gabapentin  100 mg Oral TID  . insulin aspart  0-9 Units Subcutaneous TID WC  . lisinopril  10 mg Oral Daily  . melatonin  5 mg Oral QHS  . QUEtiapine  25 mg Oral QHS         CBG: Recent Labs  Lab 04/06/20 1215 04/06/20 1630 04/06/20 2054 04/07/20 0803 04/07/20 1121  GLUCAP 270* 184* 152* 140* 186*    SpO2: 90 % O2 Flow Rate (L/min): 1 L/min    CBC: Recent Labs  Lab 04/05/20 0434  WBC 6.2  HGB 13.5  HCT  40.4  MCV 90.2  PLT [id number]    Basic Metabolic Panel: Recent Labs  Lab 04/01/20 0430 04/02/20 0715 04/05/20 0434  NA 138 139 136  K 3.3* 3.7 3.6  CL 103 104 104  CO2 24 24 25   GLUCOSE 190* 162* 167*  BUN 23 21 18   CREATININE 0.87 1.04* 1.09*  CALCIUM 8.3* 8.4* 8.4*      Liver Function Tests: Recent Labs  Lab 04/01/20 0430  AST 28  ALT 20  ALKPHOS 82  BILITOT 0.8  PROT 6.2*  ALBUMIN 2.8*     Antibiotics: Anti-infectives (From admission, onward)   Start     Dose/Rate Route Frequency Ordered Stop   04/02/20 1900  Levofloxacin (LEVAQUIN) IVPB 250 mg  Status:  Discontinued        250 mg 50 mL/hr over 60 Minutes Intravenous Every 24 hours 04/02/20 1129 04/02/20 1138   04/02/20 1800  levofloxacin (LEVAQUIN) tablet 250 mg  Status:  Discontinued        250 mg Oral Daily 04/02/20 1140 04/02/20 1421   04/02/20 1800  levofloxacin (LEVAQUIN) tablet 250 mg        250 mg Oral Daily 04/02/20 1421 04/02/20 1709   04/01/20 1900  levofloxacin (LEVAQUIN) IVPB 500 mg  Status:  Discontinued        500 mg 100 mL/hr over 60 Minutes Intravenous Every 24 hours 04/01/20 1457 04/02/20 1129   03/29/20 1900  Levofloxacin (LEVAQUIN) IVPB 250 mg  Status:  Discontinued        250 mg 50 mL/hr over 60 Minutes Intravenous Every 24 hours 03/29/20 1652 04/01/20 1457   03/29/20 1000  remdesivir 100 mg in sodium chloride 0.9 % 100 mL IVPB       "Followed by" Linked Group Details   100 mg 200 mL/hr over 30 Minutes Intravenous Daily 03/28/20 1512 04/01/20 0911   03/29/20 1000  remdesivir 100 mg in sodium chloride 0.9 % 100 mL IVPB  Status:  Discontinued       "Followed by" Linked Group Details   100 mg 200 mL/hr over 30 Minutes Intravenous Daily 03/28/20 1530 03/28/20 1531   03/28/20 1545  remdesivir 200 mg in sodium chloride 0.9% 250 mL IVPB  Status:  Discontinued       "Followed by" Linked Group Details   200 mg 580 mL/hr over 30 Minutes Intravenous Once 03/28/20 1530 03/28/20 1531   03/28/20 1515  remdesivir 200 mg in sodium chloride 0.9% 250 mL IVPB       "Followed by" Linked Group Details   200 mg 580 mL/hr over 30 Minutes Intravenous Once 03/28/20 1512 03/28/20 1905   03/28/20 1415  levofloxacin (LEVAQUIN) IVPB 750 mg        750 mg 100 mL/hr over 90 Minutes  Intravenous  Once 03/28/20 1407 03/28/20 1808   03/28/20 1000  nitrofurantoin (macrocrystal-monohydrate) (MACROBID) capsule 100 mg  Status:  Discontinued        100 mg Oral Every 12 hours 03/28/20 0441 03/29/20 1652   03/28/20 0230  nitrofurantoin (macrocrystal-monohydrate) (MACROBID) capsule 100 mg        100 mg Oral  Once 03/28/20 0229 03/28/20 0255       DVT prophylaxis: Lovenox  Code Status: Partial code/no CPR  Family Communication: Spoke to patient's daughter on phone.   Consultants:    Procedures:      Objective   Vitals:   04/07/20 0500 04/07/20 0601 04/07/20 0803 04/07/20 1121  BP:  123/61 129/85 106/62  Pulse:  99 100 94  Resp:  16 15 15   Temp:  98.4 F (36.9 C) 97.7 F (36.5 C) 98 F (36.7 C)  TempSrc:  Oral  Oral  SpO2:  95% 92% 90%  Weight: 64.1 kg     Height:       No intake or output data in the 24 hours ending 04/07/20 1327  02/04 1901 - 02/06 0700 In: -  Out: 400 [Urine:400]  Filed Weights   03/27/20 1319 03/28/20 2033 04/07/20 0500  Weight: 64 kg 64 kg 64.1 kg    Physical Examination:   General-appears in no acute distress Heart-S1-S2, regular, no murmur auscultated Lungs-clear to auscultation bilaterally, no wheezing or crackles auscultated Abdomen-soft, nontender, no organomegaly Extremities-no edema in the lower extremities Neuro-alert, oriented to self  Status is: Inpatient  Dispo: The patient is from: Home              Anticipated d/c is to: Skilled nursing facility              Anticipated d/c date is: 04/08/2020              Patient currently not stable for discharge  Barrier to discharge-ongoing management for multifocal pneumonia       Data Reviewed:   Recent Results (from the past 240 hour(s))  Blood Culture (routine x 2)     Status: None   Collection Time: 03/28/20  4:06 PM   Specimen: BLOOD  Result Value Ref Range Status   Specimen Description BLOOD BLOOD LEFT WRIST  Final   Special Requests   Final     BOTTLES DRAWN AEROBIC AND ANAEROBIC Blood Culture adequate volume   Culture   Final    NO GROWTH 5 DAYS Performed at [facility], [address] [place], [place] [address]    Report Status 04/02/2020 FINAL  Final  Blood Culture (routine x 2)     Status: Abnormal   Collection Time: 03/28/20  4:06 PM   Specimen: BLOOD  Result Value Ref Range Status   Specimen Description   Final    BLOOD LEFT ANTECUBITAL Performed at [facility], [address], [place], [place] [address]    Special Requests   Final    BOTTLES DRAWN AEROBIC AND ANAEROBIC Blood Culture adequate volume Performed at [facility], [address] [place], [place] [address]    Culture  Setup Time   Final    Organism ID to follow [place] CRITICAL RESULT CALLED TO, READ BACK BY AND VERIFIED WITH: NATHAN BELUE AT [place] 03/30/20. MF Performed at [facility], [address] [place], [place] [address]    Culture (A)  Final    STAPHYLOCOCCUS AURICULARIS THE SIGNIFICANCE OF ISOLATING THIS ORGANISM FROM A SINGLE SET OF BLOOD CULTURES WHEN MULTIPLE SETS ARE DRAWN IS UNCERTAIN. PLEASE NOTIFY THE MICROBIOLOGY DEPARTMENT WITHIN ONE WEEK IF SPECIATION AND SENSITIVITIES ARE REQUIRED. Performed at [facility], [place] [address], [place],  [address]    Report Status 04/01/2020 FINAL  Final  Blood Culture ID Panel (Reflexed)     Status: Abnormal   Collection Time: 03/28/20  4:06 PM  Result Value Ref Range Status   Enterococcus faecalis NOT DETECTED NOT DETECTED Final   Enterococcus Faecium NOT DETECTED NOT DETECTED Final   Listeria monocytogenes NOT DETECTED NOT DETECTED Final   Staphylococcus species DETECTED (A) NOT DETECTED Final    Comment: CRITICAL RESULT CALLED TO, READ  BACK BY AND VERIFIED WITH: NATHAN BELUE AT [place] 03/30/20. MF    Staphylococcus aureus (BCID) NOT DETECTED NOT DETECTED Final   Staphylococcus epidermidis NOT DETECTED NOT  DETECTED Final   Staphylococcus lugdunensis NOT DETECTED NOT DETECTED Final   Streptococcus species NOT DETECTED NOT DETECTED Final   Streptococcus agalactiae NOT DETECTED NOT DETECTED Final   Streptococcus pneumoniae NOT DETECTED NOT DETECTED Final   Streptococcus pyogenes NOT DETECTED NOT DETECTED Final   A.calcoaceticus-baumannii NOT DETECTED NOT DETECTED Final   Bacteroides fragilis NOT DETECTED NOT DETECTED Final   Enterobacterales NOT DETECTED NOT DETECTED Final   Enterobacter cloacae complex NOT DETECTED NOT DETECTED Final   Escherichia coli NOT DETECTED NOT DETECTED Final   Klebsiella aerogenes NOT DETECTED NOT DETECTED Final   Klebsiella oxytoca NOT DETECTED NOT DETECTED Final   Klebsiella pneumoniae NOT DETECTED NOT DETECTED Final   Proteus species NOT DETECTED NOT DETECTED Final   Salmonella species NOT DETECTED NOT DETECTED Final   Serratia marcescens NOT DETECTED NOT DETECTED Final   Haemophilus influenzae NOT DETECTED NOT DETECTED Final   Neisseria meningitidis NOT DETECTED NOT DETECTED Final   Pseudomonas aeruginosa NOT DETECTED NOT DETECTED Final   Stenotrophomonas maltophilia NOT DETECTED NOT DETECTED Final   Candida albicans NOT DETECTED NOT DETECTED Final   Candida auris NOT DETECTED NOT DETECTED Final   Candida glabrata NOT DETECTED NOT DETECTED Final   Candida krusei NOT DETECTED NOT DETECTED Final   Candida parapsilosis NOT DETECTED NOT DETECTED Final   Candida tropicalis NOT DETECTED NOT DETECTED Final   Cryptococcus neoformans/gattii NOT DETECTED NOT DETECTED Final    Comment: Performed at [facility], [address] [place], [place] [address]         Oswald Hillock   [facility] If 7PM-7AM, please contact night-coverage at [email or web address], Office  [phone]   04/07/2020, 1:27 PM  LOS: 9 days

## 2020-04-08 DIAGNOSIS — U071 COVID-19: Secondary | ICD-10-CM | POA: Diagnosis not present

## 2020-04-08 DIAGNOSIS — J1282 Pneumonia due to coronavirus disease 2019: Secondary | ICD-10-CM | POA: Diagnosis not present

## 2020-04-08 DIAGNOSIS — I639 Cerebral infarction, unspecified: Secondary | ICD-10-CM | POA: Diagnosis not present

## 2020-04-08 DIAGNOSIS — R4189 Other symptoms and signs involving cognitive functions and awareness: Secondary | ICD-10-CM | POA: Diagnosis not present

## 2020-04-08 LAB — GLUCOSE, CAPILLARY
Glucose-Capillary: 109 mg/dL — ABNORMAL HIGH (ref 70–99)
Glucose-Capillary: 385 mg/dL — ABNORMAL HIGH (ref 70–99)
Glucose-Capillary: 144 mg/dL — ABNORMAL HIGH (ref 70–99)
Glucose-Capillary: 182 mg/dL — ABNORMAL HIGH (ref 70–99)

## 2020-04-08 NOTE — TOC Progression Note (Signed)
Transition of Care [facility]) - Progression Note    Patient Details  Name: Erin Hamilton MRN: [id number] Date of Birth: 12-11-1950  Transition of Care [facility]) CM/SW Contact  Shelbie Hutching, RN Phone Number: 04/08/2020, 12:00 PM  Clinical Narrative:    RNCM reached out to [facility] to see if they can accept patient today.  Unfortunately they do not have any available beds but should be able to accept tomorrow.   Insurance Josem Kaufmann has been approved starting today good through 2/9.     Expected Discharge Plan: [place] Barriers to Discharge: Continued Medical Work up  Expected Discharge Plan and Services Expected Discharge Plan: [place]   Discharge Planning Services: CM Consult Post Acute Care Choice: [place] Living arrangements for the past 2 months: Single Family Home                                       Social Determinants of Health (SDOH) Interventions    Readmission Risk Interventions No flowsheet data found.

## 2020-04-08 NOTE — Progress Notes (Signed)
[facility]  PROGRESS NOTE  Erin Hamilton [id number] DOB: [birth year] DOA: 03/28/2020 PCP: Kirk Ruths, MD   Brief HPI:   70 year old female with medical history of dementia, failure to thrive, prior history of stroke in September 2019 with residual right-sided weakness, hyperlipidemia, diabetes mellitus type 2, hypertension presented to ED on 03/28/2020 via EMS with chief complaints of weakness and frequent falls.  As per daughter she was having difficulty walking for past 5 days.  In the ED she was found to be hypoxemic with O2 sats 86 to 89% on room air, requiring 2 L/min of oxygen.  COVID-19 PCR was positive.  Chest x-ray showed patchy interstitial opacities in both lung bases reflecting multifocal COVID-19 pneumonia.  She was started on Solu-Medrol, remdesivir.  Vaccination status-unvaccinated  Date of positive Covid test-03/28/2020  Subjective   Patient seen and examined, denies any new complaints.   Assessment/Plan:     1. Acute respiratory failure with hypoxemia-secondary to multifocal COVID-19 pneumonia with superimposed bacterial infection.  Patient was treated with remdesivir, steroids, antitussives and bronchodilators.  She also received Levaquin for concern for secondary bacterial pneumonia.  Currently she is requiring 1 L/min of oxygen, wean off oxygen as tolerated. 2. General debility/failure to thrive-PT/OT consulted, plan to go to skilled nursing facility when bed becomes available.. 3. ?  UTI-patient presented with suprapubic tenderness and UA consistent with UTI.  Initial urine culture grew multiple species.  Patient was treated with empiric Levaquin for 5 days for pneumonia as above. 4. Positive blood culture-patient blood culture grew 1 out of 4 bottles staph auricularis, likely contamination.  Patient was treated with empiric Levaquin.   5. Acute metabolic encephalopathy-slowly improving, patient  developed confusion in the hospital, likely delirium.   She has no new focal neurological deficits.  CT head obtained on 03/28/2020 showed stable remote infarcts including right MCA territory infarct.  No other acute abnormality.  Likely has vascular dementia at baseline.  Continue delirium precautions. 6. Hypertension-blood pressures controlled, continue lisinopril. 7. Diabetes mellitus type 2-Metformin on hold, CBG well controlled.  Continue sensitive sliding scale insulin.  Will not add long-acting insulin at this time as patient blood sugars well controlled. 8. Hyperlipidemia-continue Lipitor. 9. History of left MCA stroke-she has residual right-sided weakness, continue home medication including Plavix,  Lipitor.  Patient was not on aspirin at home.  She was started on aspirin on admission.  Will discontinue aspirin. 10. Peripheral neuropathy-likely secondary diabetes mellitus type 2.  Continue gabapentin.     COVID-19 Labs  No results for input(s): DDIMER, FERRITIN, LDH, CRP in the last 72 hours.  Lab Results  Component Value Date   SARSCOV2NAA POSITIVE (A) 03/28/2020     Scheduled medications:   . aspirin EC  81 mg Oral Daily  . atorvastatin  80 mg Oral Daily  . clopidogrel  75 mg Oral Daily  . enoxaparin (LOVENOX) injection  40 mg Subcutaneous Q24H  . ezetimibe  10 mg Oral Daily  . feeding supplement  237 mL Oral BID BM  . gabapentin  100 mg Oral TID  . insulin aspart  0-9 Units Subcutaneous TID WC  . lisinopril  10 mg Oral Daily  . melatonin  5 mg Oral QHS  . QUEtiapine  25 mg Oral QHS         CBG: Recent Labs  Lab 04/07/20 1121 04/07/20 1614 04/07/20 1958 04/08/20 0808 04/08/20 1123  GLUCAP 186* 180* 215* 109* 385*    SpO2: 93 % O2 Flow Rate (  L/min): 1 L/min    CBC: Recent Labs  Lab 04/05/20 0434  WBC 6.2  HGB 13.5  HCT 40.4  MCV 90.2  PLT 132    Basic Metabolic Panel: Recent Labs  Lab 04/02/20 0715 04/05/20 0434  NA 139 136  K 3.7 3.6  CL 104 104  CO2 24 25  GLUCOSE 162* 167*  BUN 21 18   CREATININE 1.04* 1.09*  CALCIUM 8.4* 8.4*     Liver Function Tests: No results for input(s): AST, ALT, ALKPHOS, BILITOT, PROT, ALBUMIN in the last 168 hours.   Antibiotics: Anti-infectives (From admission, onward)   Start     Dose/Rate Route Frequency Ordered Stop   04/02/20 1900  Levofloxacin (LEVAQUIN) IVPB 250 mg  Status:  Discontinued        250 mg 50 mL/hr over 60 Minutes Intravenous Every 24 hours 04/02/20 1129 04/02/20 1138   04/02/20 1800  levofloxacin (LEVAQUIN) tablet 250 mg  Status:  Discontinued        250 mg Oral Daily 04/02/20 1140 04/02/20 1421   04/02/20 1800  levofloxacin (LEVAQUIN) tablet 250 mg        250 mg Oral Daily 04/02/20 1421 04/02/20 1709   04/01/20 1900  levofloxacin (LEVAQUIN) IVPB 500 mg  Status:  Discontinued        500 mg 100 mL/hr over 60 Minutes Intravenous Every 24 hours 04/01/20 1457 04/02/20 1129   03/29/20 1900  Levofloxacin (LEVAQUIN) IVPB 250 mg  Status:  Discontinued        250 mg 50 mL/hr over 60 Minutes Intravenous Every 24 hours 03/29/20 1652 04/01/20 1457   03/29/20 1000  remdesivir 100 mg in sodium chloride 0.9 % 100 mL IVPB       "Followed by" Linked Group Details   100 mg 200 mL/hr over 30 Minutes Intravenous Daily 03/28/20 1512 04/01/20 0911   03/29/20 1000  remdesivir 100 mg in sodium chloride 0.9 % 100 mL IVPB  Status:  Discontinued       "Followed by" Linked Group Details   100 mg 200 mL/hr over 30 Minutes Intravenous Daily 03/28/20 1530 03/28/20 1531   03/28/20 1545  remdesivir 200 mg in sodium chloride 0.9% 250 mL IVPB  Status:  Discontinued       "Followed by" Linked Group Details   200 mg 580 mL/hr over 30 Minutes Intravenous Once 03/28/20 1530 03/28/20 1531   03/28/20 1515  remdesivir 200 mg in sodium chloride 0.9% 250 mL IVPB       "Followed by" Linked Group Details   200 mg 580 mL/hr over 30 Minutes Intravenous Once 03/28/20 1512 03/28/20 1905   03/28/20 1415  levofloxacin (LEVAQUIN) IVPB 750 mg        750 mg 100  mL/hr over 90 Minutes Intravenous  Once 03/28/20 1407 03/28/20 1808   03/28/20 1000  nitrofurantoin (macrocrystal-monohydrate) (MACROBID) capsule 100 mg  Status:  Discontinued        100 mg Oral Every 12 hours 03/28/20 0441 03/29/20 1652   03/28/20 0230  nitrofurantoin (macrocrystal-monohydrate) (MACROBID) capsule 100 mg        100 mg Oral  Once 03/28/20 0229 03/28/20 0255       DVT prophylaxis: Lovenox  Code Status: Partial code/no CPR  Family Communication: No family at bedside   Consultants:    Procedures:      Objective   Vitals:   04/08/20 0500 04/08/20 0519 04/08/20 0808 04/08/20 1120  BP:  102/67 112/79 94/76  Pulse:  87 87 98  Resp:  16 15 16   Temp:  (!) 97.5 F (36.4 C) 97.6 F (36.4 C) 98.2 F (36.8 C)  TempSrc:  Oral Oral   SpO2:  93% 90% 93%  Weight: 64 kg     Height:       No intake or output data in the 24 hours ending 04/08/20 1419  No intake/output data recorded.  Filed Weights   03/28/20 2033 04/07/20 0500 04/08/20 0500  Weight: 64 kg 64.1 kg 64 kg    Physical Examination:   General-appears in no acute distress Heart-S1-S2, regular, no murmur auscultated Lungs-clear to auscultation bilaterally, no wheezing or crackles auscultated Abdomen-soft, nontender, no organomegaly Extremities-no edema in the lower extremities Neuro-alert, oriented x2, no focal deficit noted  Status is: Inpatient  Dispo: The patient is from: Home              Anticipated d/c is to: Skilled nursing facility              Anticipated d/c date is: 04/09/2020              Patient currently not stable for discharge  Barrier to discharge-ongoing management for multifocal pneumonia       Data Reviewed:   No results found for this or any previous visit (from the past 240 hour(s)).       Oswald Hillock   [facility] If 7PM-7AM, please contact night-coverage at [email or web address], Office  [phone]   04/08/2020, 2:19 PM  LOS: 10 days

## 2020-04-08 NOTE — Progress Notes (Signed)
Physical Therapy Treatment Patient Details Name: Erin Hamilton MRN: [id number] DOB: 1951/02/17 Today's Date: 04/08/2020    History of Present Illness Per MD notes: Pt is a 70 y.o. female with medical history significant for dementia, failure to thrive, prior history of stroke in September 2019, hyperlipidemia, diabetes mellitus, hypertension, presented to EMS from home for chief concerns of weakness and frequent falling.  MD assessment includes: Acute hypoxemic respiratory failure due to COVID-19, debility, general weakness, and HTN.    PT Comments    Patient received in bed eating lunch, she is agreeable to PT session. Patient requires mod assist for supine to sit to raise trunk to sitting. She is able to sit unsupported once assisted there. Patient requires min assist for sit to stand. Ambulated 20 feet then after seated rest ambulated another 30 feet. Ambulation distance limited by reported fatigue. She ambulates with min assist and RW. Patient will continue to benefit from skilled PT to improve functional independence and safety.      Follow Up Recommendations  SNF;Supervision for mobility/OOB     Equipment Recommendations  Other (comment) (TBD)    Recommendations for Other Services       Precautions / Restrictions Precautions Precautions: Fall Restrictions Weight Bearing Restrictions: No    Mobility  Bed Mobility Overal bed mobility: Needs Assistance Bed Mobility: Supine to Sit     Supine to sit: Mod assist     General bed mobility comments: Requires mod assist to raise trunk to seated position  Transfers Overall transfer level: Needs assistance Equipment used: Rolling walker (2 wheeled) Transfers: Sit to/from Stand Sit to Stand: Min assist         General transfer comment: Patient requires min assist for sit to stand and to maintain initial standing balance.  Ambulation/Gait Ambulation/Gait assistance: Min assist Gait Distance (Feet): 30 Feet Assistive  device: Rolling walker (2 wheeled) Gait Pattern/deviations: Step-through pattern;Decreased step length - right;Decreased step length - left;Trunk flexed;Shuffle;Decreased stride length;Narrow base of support Gait velocity: decreased   General Gait Details: Patient ambulated 20 feet then another 30 feet with RW and min assist. Small shuffle steps.   Stairs             Wheelchair Mobility    Modified Rankin (Stroke Patients Only)       Balance Overall balance assessment: Needs assistance Sitting-balance support: Feet supported;No upper extremity supported Sitting balance-[name] Scale: Fair Sitting balance - Comments: supervision for sitting at edge of bed. Postural control: Posterior lean Standing balance support: Bilateral upper extremity supported;During functional activity Standing balance-[name] Scale: Fair Standing balance comment: Mod lean on the RW for support in standing and during amb, slightly unsteady                            Cognition Arousal/Alertness: Awake/alert Behavior During Therapy: Flat affect Overall Cognitive Status: History of cognitive impairments - at baseline                                 General Comments: Repeats same questions throughout session.      Exercises      General Comments        Pertinent Vitals/Pain Pain Assessment: No/denies pain    Home Living                      Prior Function  PT Goals (current goals can now be found in the care plan section) Acute Rehab PT Goals Patient Stated Goal: to get out of here PT Goal Formulation: With patient Time For Goal Achievement: 04/11/20 Potential to Achieve Goals: Fair Progress towards PT goals: Progressing toward goals    Frequency    Min 2X/week      PT Plan Current plan remains appropriate    Co-evaluation              AM-PAC PT "6 Clicks" Mobility   Outcome Measure  Help needed turning from your back to  your side while in a flat bed without using bedrails?: A Little Help needed moving from lying on your back to sitting on the side of a flat bed without using bedrails?: A Lot Help needed moving to and from a bed to a chair (including a wheelchair)?: A Little Help needed standing up from a chair using your arms (e.g., wheelchair or bedside chair)?: A Little Help needed to walk in hospital room?: A Little Help needed climbing 3-5 steps with a railing? : A Lot 6 Click Score: 16    End of Session Equipment Utilized During Treatment: Gait belt Activity Tolerance: Patient limited by fatigue Patient left: in chair;with call bell/phone within reach;with chair alarm set Nurse Communication: Mobility status PT Visit Diagnosis: History of falling (Z91.81);Difficulty in walking, not elsewhere classified (R26.2);Muscle weakness (generalized) (M62.81);Unsteadiness on feet (R26.81)     Time: 6144-3154 PT Time Calculation (min) (ACUTE ONLY): 25 min  Charges:  $Gait Training: 23-37 mins                     Neiko Trivedi, PT, GCS 04/08/20,2:17 PM

## 2020-04-08 NOTE — Progress Notes (Signed)
Occupational Therapy Treatment Patient Details Name: Erin Hamilton MRN: [id number] DOB: 03/15/1950 Today's Date: 04/08/2020    History of present illness Per MD notes: Pt is a 70 y.o. female with medical history significant for dementia, failure to thrive, prior history of stroke in September 2019, hyperlipidemia, diabetes mellitus, hypertension, presented to EMS from home for chief concerns of weakness and frequent falling.  MD assessment includes: Acute hypoxemic respiratory failure due to COVID-19, debility, general weakness, and HTN.   OT comments  Upon entering the room, NT reports pt having just washed up and returned to bed. Pt agreeable to OT intervention and reports, " I want to get out of here". OT offers several opportunities for therapeutic activities with pt declining. Pt verbalized need for toileting. OT set up room for transfer and ambulation with pt moving LEs to EOB and then declining to stand from bed and reports, " No, I don't have to now". Pt needing mod A to reposition self in bed and drinks a few sips of water and then asks to rest. Bed alarm activated for safety and bed lowered. All needs within reach. OT continue to recommend short term rehab stay to address functional deficits.   Follow Up Recommendations  SNF    Equipment Recommendations  Other (comment) (defer to next venue of care)       Precautions / Restrictions Precautions Precautions: Fall Restrictions Weight Bearing Restrictions: No       Mobility Bed Mobility Overal bed mobility: Needs Assistance Bed Mobility: Supine to Sit Rolling: Mod assist   Supine to sit: Mod assist     General bed mobility comments: mod A from flat bed  Transfers Overall transfer level: Needs assistance Equipment used: Rolling walker (2 wheeled) Transfers: Sit to/from Stand Sit to Stand: Min assist         General transfer comment: Pt declined secondary to fatigue    Balance Overall balance assessment: Needs  assistance Sitting-balance support: Feet supported;No upper extremity supported Sitting balance-[name] Scale: Fair Sitting balance - Comments: supervision for sitting at edge of bed. Postural control: Posterior lean Standing balance support: Bilateral upper extremity supported;During functional activity Standing balance-[name] Scale: Fair Standing balance comment: Mod lean on the RW for support in standing and during amb, slightly unsteady                           ADL either performed or assessed with clinical judgement   ADL Overall ADL's : Needs assistance/impaired Eating/Feeding: Set up;Sitting                                           Vision Patient Visual Report: No change from baseline            Cognition Arousal/Alertness: Awake/alert Behavior During Therapy: Flat affect Overall Cognitive Status: History of cognitive impairments - at baseline                                 General Comments: very tangential speech and needing min cuing for redirection to tasks                   Pertinent Vitals/ Pain       Pain Assessment: No/denies pain         Frequency  Min 1X/week        Progress Toward Goals  OT Goals(current goals can now be found in the care plan section)  Progress towards OT goals: Progressing toward goals  Acute Rehab OT Goals Patient Stated Goal: to get out of here OT Goal Formulation: Patient unable to participate in goal setting Time For Goal Achievement: 04/12/20 Potential to Achieve Goals: [place] Discharge plan remains appropriate;Frequency remains appropriate       AM-PAC OT "6 Clicks" Daily Activity     Outcome Measure   Help from another person eating meals?: A Little Help from another person taking care of personal grooming?: A Little Help from another person toileting, which includes using toliet, bedpan, or urinal?: A Little Help from another person bathing (including  washing, rinsing, drying)?: A Lot Help from another person to put on and taking off regular upper body clothing?: A Little Help from another person to put on and taking off regular lower body clothing?: A Lot 6 Click Score: 16    End of Session    OT Visit Diagnosis: Unsteadiness on feet (R26.81);Muscle weakness (generalized) (M62.81);Other symptoms and signs involving the nervous system (R29.898);Other symptoms and signs involving cognitive function   Activity Tolerance Patient limited by fatigue   Patient Left in bed;with call bell/phone within reach;with bed alarm set   Nurse Communication Mobility status        Time: 3762-8315 OT Time Calculation (min): 18 min  Charges: OT General Charges $OT Visit: 1 Visit OT Treatments $Self Care/Home Management : 8-22 mins  Darleen Crocker, MS, OTR/L , CBIS ascom [phone]  04/08/20, 4:37 PM

## 2020-04-08 NOTE — Care Management Important Message (Signed)
Important Message  Patient Details  Name: Erin Hamilton MRN: [id number] Date of Birth: 10/13/1950   Medicare Important Message Given:  Yes     Shelbie Hutching, RN 04/08/2020, 12:12 PM

## 2020-04-09 DIAGNOSIS — I10 Essential (primary) hypertension: Secondary | ICD-10-CM | POA: Diagnosis not present

## 2020-04-09 DIAGNOSIS — U071 COVID-19: Secondary | ICD-10-CM | POA: Diagnosis not present

## 2020-04-09 DIAGNOSIS — G9341 Metabolic encephalopathy: Secondary | ICD-10-CM | POA: Diagnosis not present

## 2020-04-09 DIAGNOSIS — R531 Weakness: Secondary | ICD-10-CM | POA: Diagnosis not present

## 2020-04-09 LAB — CBC
HCT: 44.3 % (ref 36.0–46.0)
Hemoglobin: 14.6 g/dL (ref 12.0–15.0)
MCH: 30.4 pg (ref 26.0–34.0)
MCHC: 33 g/dL (ref 30.0–36.0)
MCV: 92.3 fL (ref 80.0–100.0)
Platelets: 380 K/uL (ref 150–400)
RBC: 4.8 MIL/uL (ref 3.87–5.11)
RDW: 13.1 % (ref 11.5–15.5)
WBC: 8.8 K/uL (ref 4.0–10.5)
nRBC: 0 % (ref 0.0–0.2)

## 2020-04-09 LAB — GLUCOSE, CAPILLARY
Glucose-Capillary: 140 mg/dL — ABNORMAL HIGH (ref 70–99)
Glucose-Capillary: 245 mg/dL — ABNORMAL HIGH (ref 70–99)

## 2020-04-09 MED ORDER — LISINOPRIL 10 MG PO TABS: 10.0000 mg | ORAL_TABLET | Freq: Every day | ORAL | 0 refills | Status: AC

## 2020-04-09 MED ORDER — GABAPENTIN 100 MG PO CAPS: 100.0000 mg | ORAL_CAPSULE | Freq: Three times a day (TID) | ORAL | Status: AC

## 2020-04-09 MED ORDER — INSULIN ASPART 100 UNIT/ML ~~LOC~~ SOLN: 0.0000 [IU] | Freq: Three times a day (TID) | SUBCUTANEOUS | 11 refills | Status: AC

## 2020-04-09 MED ORDER — ACETAMINOPHEN 325 MG PO TABS: 325.0000 mg | ORAL_TABLET | Freq: Four times a day (QID) | ORAL | Status: AC | PRN

## 2020-04-09 MED ORDER — QUETIAPINE FUMARATE 25 MG PO TABS: 25.0000 mg | ORAL_TABLET | Freq: Every day | ORAL | Status: AC

## 2020-04-09 NOTE — Discharge Summary (Signed)
Physician Discharge Summary  Erin Hamilton YNW:[id number] DOB: 1950/09/10 DOA: 03/28/2020  PCP: Kirk Ruths, MD  Admit date: 03/28/2020 Discharge date: 04/09/2020  Time spent: 50  minutes  Recommendations for Outpatient Follow-up:  1. Patient to be discharged to skilled nursing facility  Discharge Diagnoses:  Principal Problem:   Pneumonia due to COVID-19 virus Active Problems:   Type 2 diabetes mellitus with hyperglycemia, without long-term current use of insulin (HCC)   Hyperlipidemia   Depression   Essential hypertension, benign   Cognitive dysfunction due to acute cerebrovascular accident (CVA) (HCC)   Hemiparesis affecting left side as late effect of cerebrovascular accident [facility])   H/O carotid endarterectomy   UTI (urinary tract infection)   H/O: stroke   Acute hypoxemic respiratory failure due to COVID-19 [facility])   Positive blood culture   Acute metabolic encephalopathy   Discharge Condition: Stable  Diet recommendation: Heart healthy diet  Filed Weights   03/28/20 2033 04/07/20 0500 04/08/20 0500  Weight: 64 kg 64.1 kg 64 kg    History of present illness:  70 year old female with medical history of dementia, failure to thrive, prior history of stroke in September 2019 with residual right-sided weakness, hyperlipidemia, diabetes mellitus type 2, hypertension presented to ED on 03/28/2020 via EMS with chief complaints of weakness and frequent falls.  As per daughter she was having difficulty walking for past 5 days.  In the ED she was found to be hypoxemic with O2 sats 86 to 89% on room air, requiring 2 L/min of oxygen.  COVID-19 PCR was positive.  Chest x-ray showed patchy interstitial opacities in both lung bases reflecting multifocal COVID-19 pneumonia.  She was started on Solu-Medrol, remdesivir.   Hospital Course:  1. Acute respiratory failure with hypoxemia-resolved, secondary to multifocal COVID-19 pneumonia with superimposed bacterial infection.  Patient  was treated with remdesivir, steroids, antitussives and bronchodilators.  She also received Levaquin for concern for secondary bacterial pneumonia.  Currently she is not requiring oxygen.  Oxygen saturation 92% on room air.  2. General debility/failure to thrive-PT/OT consulted, plan to go to skilled nursing facility today. 3. ?  UTI-patient presented with suprapubic tenderness and UA consistent with UTI.  Initial urine culture grew multiple species.  Patient was treated with empiric Levaquin for 5 days for pneumonia as above. 4. Positive blood culture-patient blood culture grew 1 out of 4 bottles staph auricularis, likely contamination.  Patient was treated with empiric Levaquin.   5. Acute metabolic encephalopathy-significantly improved,  patient  developed confusion in the hospital, likely delirium.  She has no new focal neurological deficits.  CT head obtained on 03/28/2020 showed stable remote infarcts including right MCA territory infarct.  No other acute abnormality.  Likely has vascular dementia at baseline.   6. Hypertension-blood pressures controlled, continue lisinopril. 7. Diabetes mellitus type 2-Metformin on hold, CBG well controlled.  Continue sensitive sliding scale insulin.   8. Hyperlipidemia-continue Lipitor. 9. History of left MCA stroke-she has residual right-sided weakness, continue home medication including Plavix,  Lipitor.    Called and confirmed with pharmacy in the hospital, patient was not on aspirin at home.  She was started on aspirin on admission.    Aspirin was discontinued.  Continue Plavix. 10. Peripheral neuropathy-likely secondary diabetes mellitus type 2.  Continue gabapentin.   Procedures:    Consultations:    Discharge Exam: Vitals:   04/09/20 0740 04/09/20 1130  BP: (!) 138/117 111/79  Pulse: 94 99  Resp: 17 17  Temp: (!) 97.4 F (36.3  C) 98.6 F (37 C)  SpO2: 100% 92%    General: Appears in no acute distress Cardiovascular: S1-S2,  regular Respiratory: Clear to auscultation bilaterally  Discharge Instructions   Discharge Instructions    Diet - low sodium heart healthy   Complete by: As directed    Increase activity slowly   Complete by: As directed      Allergies as of 04/09/2020      Reactions   Oysters [shellfish Allergy] Anaphylaxis   Penicillins Anaphylaxis   Has patient had a PCN reaction causing immediate rash, facial/tongue/throat swelling, SOB or lightheadedness with hypotension: Yes Has patient had a PCN reaction causing severe rash involving mucus membranes or skin necrosis: No Has patient had a PCN reaction that required hospitalization No Has patient had a PCN reaction occurring within the last 10 years: No If all of the above answers are "NO", then may proceed with Cephalosporin use.   Latex Rash      Medication List    TAKE these medications   acetaminophen 325 MG tablet Commonly known as: TYLENOL Take 1 tablet (325 mg total) by mouth every 6 (six) hours as needed for mild pain, fever or headache (fever >/= 101).   clopidogrel 75 MG tablet Commonly known as: PLAVIX TAKE 1 TABLET EVERY DAY   escitalopram 10 MG tablet Commonly known as: LEXAPRO Take 1 tablet by mouth daily.   gabapentin 100 MG capsule Commonly known as: NEURONTIN Take 1 capsule (100 mg total) by mouth 3 (three) times daily.   glucose blood test strip 1 each by Other route daily. Use as instructed   insulin aspart 100 UNIT/ML injection Commonly known as: novoLOG Inject 0-9 Units into the skin 3 (three) times daily with meals. Sliding scale insulin Less than 70 initiate hypoglycemia protocol 70-120  0 units 120-150 1 unit 151-200 2 units 201-250 3 units 251-300 5 units 301-350 7 units 351-400 9 units Greater than 400 call MD   lisinopril 10 MG tablet Commonly known as: ZESTRIL Take 1 tablet (10 mg total) by mouth daily.   QUEtiapine 25 MG tablet Commonly known as: SEROQUEL Take 1 tablet (25 mg total) by mouth at  bedtime.   simvastatin 20 MG tablet Commonly known as: ZOCOR Take 20 mg by mouth daily.      Allergies  Allergen Reactions  . Oysters [Shellfish Allergy] Anaphylaxis  . Penicillins Anaphylaxis    Has patient had a PCN reaction causing immediate rash, facial/tongue/throat swelling, SOB or lightheadedness with hypotension: Yes Has patient had a PCN reaction causing severe rash involving mucus membranes or skin necrosis: No Has patient had a PCN reaction that required hospitalization No Has patient had a PCN reaction occurring within the last 10 years: No If all of the above answers are "NO", then may proceed with Cephalosporin use.   . Latex Rash    Contact information for after-discharge care    Destination    [facility] [place] [facility] Preferred SNF .   Service: Skilled Nursing Contact information: [address] [place] [phone]                   The results of significant diagnostics from this hospitalization (including imaging, microbiology, ancillary and laboratory) are listed below for reference.    Significant Diagnostic Studies: DG Chest 1 View  Result Date: 03/28/2020 CLINICAL DATA:  Possible sepsis, COVID positive EXAM: CHEST  1 VIEW COMPARISON:  2015 FINDINGS: Patchy interstitial opacities at the lung bases. No  pleural effusion. No pneumothorax. Cardiomediastinal contours are within normal limits for technique. IMPRESSION: Patchy interstitial opacities at the lung bases, which may reflect COVID pneumonia. Electronically Signed   By: Macy Mis M.D.   On: 03/28/2020 15:00   CT Head Wo Contrast  Result Date: 03/28/2020 CLINICAL DATA:  Chronic antiplatelet therapy, multiple falls, head injury, remote CVA EXAM: CT HEAD WITHOUT CONTRAST TECHNIQUE: Contiguous axial images were obtained from the base of the skull through the vertex without intravenous contrast. COMPARISON:  03/22/2020 FINDINGS: Brain: Large right remote MCA  territory infarct again noted. Remote lacunar infarcts noted within the a left insular cortex and left putamen. Moderate parenchymal volume loss is commensurate with the patient's age. Moderate periventricular white matter changes are present likely reflecting the sequela of small vessel ischemia. No evidence of acute intracranial hemorrhage or infarct. No abnormal mass effect or midline shift. No abnormal intra or extra-axial mass lesion or fluid collection. Ventricular size is normal. Cerebellum is unremarkable. Vascular: No asymmetric hyperdense vasculature noted at the skull base. Skull: Intact Sinuses/Orbits: Paranasal sinuses are clear. The orbits are unremarkable. Other: Mastoid air cells and middle ear cavities are clear. IMPRESSION: Stable remote infarcts including large right MCA territory infarct. No evidence of acute intracranial hemorrhage or infarct. No calvarial fracture. Electronically Signed   By: Fidela Salisbury MD   On: 03/28/2020 03:33   CT Head Wo Contrast  Result Date: 03/22/2020 CLINICAL DATA:  Mental status change, unknown cause EXAM: CT HEAD WITHOUT CONTRAST TECHNIQUE: Contiguous axial images were obtained from the base of the skull through the vertex without intravenous contrast. COMPARISON:  07/28/2019 and prior. FINDINGS: Brain: No acute infarct or intracranial hemorrhage. No mass lesion. No midline shift, ventriculomegaly or extra-axial fluid collection. Sequela of remote bilateral MCA territory insults. Mild cerebral atrophy with ex vacuo dilatation. Background chronic microvascular ischemic changes. Vascular: No hyperdense vessel or unexpected calcification. Carotid siphon atherosclerotic calcifications. Skull: No acute finding. Sinuses/Orbits: No acute finding. Other: None. IMPRESSION: No acute intracranial process. Remote bilateral MCA territory insults. Mild cerebral atrophy and chronic microvascular ischemic changes. Electronically Signed   By: Primitivo Gauze M.D.   On:  03/22/2020 17:21    Microbiology: No results found for this or any previous visit (from the past 240 hour(s)).   Labs: Basic Metabolic Panel: Recent Labs  Lab 04/05/20 0434  NA 136  K 3.6  CL 104  CO2 25  GLUCOSE 167*  BUN 18  CREATININE 1.09*  CALCIUM 8.4*   Liver Function Tests: No results for input(s): AST, ALT, ALKPHOS, BILITOT, PROT, ALBUMIN in the last 168 hours. No results for input(s): LIPASE, AMYLASE in the last 168 hours. No results for input(s): AMMONIA in the last 168 hours. CBC: Recent Labs  Lab 04/05/20 0434 04/09/20 0607  WBC 6.2 8.8  HGB 13.5 14.6  HCT 40.4 44.3  MCV 90.2 92.3  PLT 307 380    CBG: Recent Labs  Lab 04/08/20 0808 04/08/20 1123 04/08/20 1627 04/08/20 1934 04/09/20 0744  GLUCAP 109* 385* 144* 182* 140*       Signed:  Oswald Hillock MD.  [facility] 04/09/2020, 11:33 AM

## 2020-04-09 NOTE — TOC Transition Note (Signed)
Transition of Care [facility]) - CM/SW Discharge Note   Patient Details  Name: Erin Hamilton MRN: [id number] Date of Birth: 08-25-1950  Transition of Care [facility]) CM/SW Contact:  Shelbie Hutching, RN Phone Number: 04/09/2020, 1:32 PM   Clinical Narrative:     Patient medically ready for discharge to [facility] room 714.  Bedside RN will call report to [phone].  Erin Hamilton, patient's daughter is aware of discharge to [facility] today.  [facility] has been arranged for transport.   Final next level of care: Skilled Nursing Facility Barriers to Discharge: Barriers Resolved   Patient Goals and CMS Choice Patient states their goals for this hospitalization and ongoing recovery are:: daughter wants patient to go to rehab CMS Medicare.gov Compare Post Acute Care list provided to:: Patient Represenative (must comment) Choice offered to / list presented to : Adult Children  Discharge Placement   Existing PASRR number confirmed : 02/27/20          Patient chooses bed at: [facility] [place] Patient to be transferred to facility by: [facility] Name of family member notified: Erin Hamilton Patient and family notified of of transfer: 04/09/20  Discharge Plan and Services   Discharge Planning Services: CM Consult Post Acute Care Choice: [place]                               Social Determinants of Health (SDOH) Interventions     Readmission Risk Interventions No flowsheet data found.

## 2020-04-09 NOTE — Progress Notes (Signed)
Occupational Therapy Treatment Patient Details Name: Erin Hamilton MRN: [id number] DOB: 10/01/1950 Today's Date: 04/09/2020    History of present illness Per MD notes: Pt is a 70 y.o. female with medical history significant for dementia, failure to thrive, prior history of stroke in September 2019, hyperlipidemia, diabetes mellitus, hypertension, presented to EMS from home for chief concerns of weakness and frequent falling.  MD assessment includes: Acute hypoxemic respiratory failure due to COVID-19, debility, general weakness, and HTN.   OT comments  Upon entering the room, pt supine in bed with no c/o pain and agreeable to OT intervention. Pt perseverating on family members and not knowing her daughter's phone number with mod cuing for redirection during session. Pt performing supine >sit with min A to EOB. Pt declined toileting needs. Pt standing with min A and ambulating 10' to sink to wash hands with min A for standing balance. Pt then begins urinating on floor and needs assistance to return back to bed for safety. Pt able to doff soiled socks, wash feet, and don clean socks with min A to thread. Pt performing peri hygiene in standing with min A for balance. Pt returning to supine and repositioning in bed with min A. OT assisting pt with calling daughter's number but no one answered. All needs within reach. Bed alarm activated. Pt would continue to benefit from SNF to address functional deficits.  Follow Up Recommendations  SNF    Equipment Recommendations  Other (comment) (defer to next venue of care)       Precautions / Restrictions Precautions Precautions: Fall       Mobility Bed Mobility Overal bed mobility: Needs Assistance Bed Mobility: Supine to Sit Rolling: Min assist   Supine to sit: Min assist Sit to supine: Min assist   General bed mobility comments: min A from flat bed  Transfers Overall transfer level: Needs assistance Equipment used: Rolling walker (2  wheeled) Transfers: Sit to/from Stand Sit to Stand: Min assist              Balance Overall balance assessment: Needs assistance Sitting-balance support: Feet supported;No upper extremity supported Sitting balance-[name] Scale: Fair     Standing balance support: Bilateral upper extremity supported;During functional activity Standing balance-[name] Scale: Fair                             ADL either performed or assessed with clinical judgement   ADL Overall ADL's : Needs assistance/impaired     Grooming: Wash/dry hands;Standing;Min guard       Lower Body Bathing: Minimal assistance;Sit to/from stand;Sitting/lateral leans Lower Body Bathing Details (indicate cue type and reason): for peri hygiene and then washing B LEs Upper Body Dressing : Set up;Sitting Upper Body Dressing Details (indicate cue type and reason): to don hospital gown Lower Body Dressing: Minimal assistance;Bed level Lower Body Dressing Details (indicate cue type and reason): to don/doff socks                     Vision Patient Visual Report: No change from baseline            Cognition Arousal/Alertness: Awake/alert   Overall Cognitive Status: History of cognitive impairments - at baseline                                 General Comments: Min - mod multimodal cuing for redirections  of tasks             Frequency  Min 1X/week        Progress Toward Goals  OT Goals(current goals can now be found in the care plan section)  Progress towards OT goals: Progressing toward goals  Acute Rehab OT Goals Patient Stated Goal: to get out of here OT Goal Formulation: With patient Time For Goal Achievement: 04/12/20 Potential to Achieve Goals: [place] Discharge plan remains appropriate;Frequency remains appropriate       AM-PAC OT "6 Clicks" Daily Activity     Outcome Measure   Help from another person eating meals?: A Little Help from another person  taking care of personal grooming?: A Little Help from another person toileting, which includes using toliet, bedpan, or urinal?: A Little   Help from another person to put on and taking off regular upper body clothing?: A Little Help from another person to put on and taking off regular lower body clothing?: A Lot 6 Click Score: 14    End of Session Equipment Utilized During Treatment: Rolling walker  OT Visit Diagnosis: Unsteadiness on feet (R26.81);Muscle weakness (generalized) (M62.81);Other symptoms and signs involving the nervous system (R29.898);Other symptoms and signs involving cognitive function   Activity Tolerance Patient limited by fatigue   Patient Left in bed;with call bell/phone within reach;with bed alarm set   Nurse Communication Mobility status        Time: 9390-3009 OT Time Calculation (min): 39 min  Charges: OT General Charges $OT Visit: 1 Visit OT Treatments $Self Care/Home Management : 38-52 mins  Darleen Crocker, MS, OTR/L , CBIS ascom [phone]  04/09/20, 1:06 PM

## 2020-05-13 ENCOUNTER — Other Ambulatory Visit (INDEPENDENT_AMBULATORY_CARE_PROVIDER_SITE_OTHER): Payer: Self-pay | Admitting: Nurse Practitioner

## 2020-05-13 DIAGNOSIS — I6523 Occlusion and stenosis of bilateral carotid arteries: Secondary | ICD-10-CM

## 2020-05-14 ENCOUNTER — Ambulatory Visit (INDEPENDENT_AMBULATORY_CARE_PROVIDER_SITE_OTHER): Payer: Medicare Other

## 2020-05-14 ENCOUNTER — Encounter (INDEPENDENT_AMBULATORY_CARE_PROVIDER_SITE_OTHER): Payer: Self-pay | Admitting: Vascular Surgery

## 2020-05-14 ENCOUNTER — Ambulatory Visit (INDEPENDENT_AMBULATORY_CARE_PROVIDER_SITE_OTHER): Payer: Medicare Other | Admitting: Vascular Surgery

## 2020-05-14 ENCOUNTER — Other Ambulatory Visit: Payer: Self-pay

## 2020-05-14 VITALS — BP 164/94 | HR 89 | Ht 62.0 in | Wt 137.0 lb

## 2020-05-14 DIAGNOSIS — E785 Hyperlipidemia, unspecified: Secondary | ICD-10-CM | POA: Diagnosis not present

## 2020-05-14 DIAGNOSIS — I6523 Occlusion and stenosis of bilateral carotid arteries: Secondary | ICD-10-CM

## 2020-05-14 DIAGNOSIS — I10 Essential (primary) hypertension: Secondary | ICD-10-CM

## 2020-05-14 DIAGNOSIS — E119 Type 2 diabetes mellitus without complications: Secondary | ICD-10-CM | POA: Diagnosis not present

## 2020-05-14 NOTE — Assessment & Plan Note (Signed)
lipid control important in reducing the progression of atherosclerotic disease. Continue statin therapy  

## 2020-05-14 NOTE — Progress Notes (Signed)
MRN : [id number]  Erin Hamilton is a 70 y.o. (Oct 23, 1950) female who presents with chief complaint of  Chief Complaint  Patient presents with  . Follow-up    1 yr U/s  . Carotid  .  History of Present Illness: Patient returns in follow-up of her carotid disease.  She had a carotid endarterectomy performed about 7 or 8 years ago.  She is doing much better cognitively after coming off a lot of medications.  Her daughter provides much of the history today.  Apparently, they have switched primary care physicians or are in the process of doing so.  No new focal neurologic symptoms since her last visit. Carotid duplex today shows 1 to 39% ICA stenosis bilaterally with patent right carotid endarterectomy with a small amount of soft homogenous thrombus in the bulb that has been seen on the last several studies.  Current Outpatient Medications  Medication Sig Dispense Refill  . clopidogrel (PLAVIX) 75 MG tablet TAKE 1 TABLET EVERY DAY 90 tablet 1  . escitalopram (LEXAPRO) 10 MG tablet Take 1 tablet by mouth daily.    Marland Kitchen gabapentin (NEURONTIN) 100 MG capsule Take 1 capsule (100 mg total) by mouth 3 (three) times daily.    . simvastatin (ZOCOR) 20 MG tablet Take 20 mg by mouth daily.    Marland Kitchen acetaminophen (TYLENOL) 325 MG tablet Take 1 tablet (325 mg total) by mouth every 6 (six) hours as needed for mild pain, fever or headache (fever >/= 101). (Patient not taking: Reported on 05/14/2020)    . glucose blood test strip 1 each by Other route daily. Use as instructed (Patient not taking: Reported on 05/14/2020)    . insulin aspart (NOVOLOG) 100 UNIT/ML injection Inject 0-9 Units into the skin 3 (three) times daily with meals. Sliding scale insulin Less than 70 initiate hypoglycemia protocol 70-120  0 units 120-150 1 unit 151-200 2 units 201-250 3 units 251-300 5 units 301-350 7 units 351-400 9 units Greater than 400 call MD (Patient not taking: Reported on 05/14/2020) 10 mL 11  . lisinopril (ZESTRIL) 10 MG  tablet Take 1 tablet (10 mg total) by mouth daily. (Patient not taking: Reported on 05/14/2020) 90 tablet 0  . QUEtiapine (SEROQUEL) 25 MG tablet Take 1 tablet (25 mg total) by mouth at bedtime. (Patient not taking: Reported on 05/14/2020)     No current facility-administered medications for this visit.    Past Medical History:  Diagnosis Date  . Anxiety   . CVA (cerebral infarction)   . Diabetes mellitus without complication ([place])   . Hyperlipidemia   . Hypertension   . Squamous cell carcinoma of skin 07/05/2019   Upper sternum. MD SCC.  . Stroke [facility])     Past Surgical History:  Procedure Laterality Date  . TUBAL LIGATION       Social History   Tobacco Use  . Smoking status: Former Research scientist (life sciences)  . Smokeless tobacco: Never Used  Substance Use Topics  . Alcohol use: No    Alcohol/week: 0.0 standard drinks  . Drug use: No      Family History  Problem Relation Age of Onset  . Stroke Father   . Heart attack Sister   . Breast cancer Sister   . Cancer Son        throat and lung  . Cancer Sister        gum     Allergies  Allergen Reactions  . Oysters [Shellfish Allergy] Anaphylaxis  . Penicillins Anaphylaxis  Has patient had a PCN reaction causing immediate rash, facial/tongue/throat swelling, SOB or lightheadedness with hypotension: Yes Has patient had a PCN reaction causing severe rash involving mucus membranes or skin necrosis: No Has patient had a PCN reaction that required hospitalization No Has patient had a PCN reaction occurring within the last 10 years: No If all of the above answers are "NO", then may proceed with Cephalosporin use.   . Latex Rash    Constitutional: [] ?Weight loss[] ?Fever[] ?Chills Cardiac:[] ?Chest pain[] ? Atrial Fibrillation[] ?Palpitations [] ?Shortness of breath when laying flat [] ?Shortness of breath with exertion. [] ?Shortness of breath at rest Vascular: [] ?Pain in legs with walking[] ?Pain in legswith standing[] ?Pain  in legs when laying flat [] ?Claudication  [] ?Pain in feet when laying flat [] ?History of DVT [] ?Phlebitis [] ?Swelling in legs [] ?Varicose veins [] ?Non-healing ulcers Pulmonary: [] ?Uses home oxygen [] ?Productive cough[] ?Hemoptysis [] ?Wheeze [] ?COPD [] ?Asthma Neurologic: [] ?Dizziness[] ?Seizures [] ?Blackouts[x] ?History of stroke [] ?History of TIA[] ?Aphasia [] ?Temporary Blindness[] ?Weaknessor numbness in arm [x] ?Weakness or numbnessin leg Musculoskeletal:[] ?Joint swelling [] ?Joint pain [] ?Low back pain  [] ? History of Knee Replacement [] ?Arthritis [] ?back Surgeries[] ? Spinal Stenosis  Hematologic:[] ?Easy bruising[] ?Easy bleeding [] ?Hypercoagulable state [] ?Anemic Gastrointestinal:[] ?Diarrhea [] ?Vomiting[] ?Gastroesophageal reflux/heartburn[] ?Difficulty swallowing. [] ?Abdominal pain Genitourinary: [] ?Chronic kidney disease [] ?Difficulturination [] ?Anuric[] ?Blood in urine [] ?Frequenturination [] ?Burning with urination[] ?Hematuria Skin: [] ?Rashes [] ?Ulcers [] ?Wounds Psychological: [x] ?History of anxiety[x] ?History of major depression  [] ? Memory Difficulties  Physical Examination  Vitals:   05/14/20 1457  BP: (!) 164/94  Pulse: 89  Weight: 137 lb (62.1 kg)  Height: 5\' 2"  (1.575 m)   Body mass index is 25.06 kg/m. Gen:  WD/WN, NAD Head: [place]/AT, No temporalis wasting. Ear/Nose/Throat: Hearing grossly intact, nares w/o erythema or drainage, trachea midline Eyes: Conjunctiva clear. Sclera non-icteric Neck: Supple.  No bruit  Pulmonary:  Good air movement, equal and clear to auscultation bilaterally.  Cardiac: RRR, No JVD Vascular:  Vessel Right Left  Radial Palpable Palpable       Musculoskeletal:   No deformity or atrophy.  In a wheelchair.  Mild lower extremity edema. Neurologic: CN 2-12 intact. Sensation grossly intact in extremities.   Psychiatric: Judgment and insight appear fair at best.  Family  provides much of the history. Dermatologic: No rashes or ulcers noted.  No cellulitis or open wounds.      CBC Lab Results  Component Value Date   WBC 8.8 04/09/2020   HGB 14.6 04/09/2020   HCT 44.3 04/09/2020   MCV 92.3 04/09/2020   PLT 380 04/09/2020    BMET    Component Value Date/Time   NA 136 04/05/2020 0434   NA 143 07/05/2015 1434   K 3.6 04/05/2020 0434   CL 104 04/05/2020 0434   CO2 25 04/05/2020 0434   GLUCOSE 167 (H) 04/05/2020 0434   BUN 18 04/05/2020 0434   BUN 12 07/05/2015 1434   CREATININE 1.09 (H) 04/05/2020 0434   CALCIUM 8.4 (L) 04/05/2020 0434   GFRNONAA 55 (L) 04/05/2020 0434   GFRAA >60 11/07/2017 0450   CrCl cannot be calculated (Patient's most recent lab result is older than the maximum 21 days allowed.).  COAG Lab Results  Component Value Date   INR 1.0 03/28/2020   INR 1.00 06/23/2016    Radiology No results found.   Assessment/Plan Essential hypertension, benign blood pressure control important in reducing the progression of atherosclerotic disease. On appropriate oral medications.   Diabetes mellitus without complication (HCC) blood glucose control important in reducing the progression of atherosclerotic disease. Also, involved in wound healing. On appropriate medications.   Hyperlipidemia lipid control important in reducing the progression of atherosclerotic disease. Continue  statin therapy   Bilateral carotid artery stenosis Carotid duplex today shows 1 to 39% ICA stenosis bilaterally with patent right carotid endarterectomy with a small amount of soft homogenous thrombus in the bulb that has been seen on the last several studies.  No role for intervention.  Continue current medical regimen.  Recheck in 1 year    Leotis Pain, MD  05/14/2020 3:34 PM    This note was created with Dragon medical transcription system.  Any errors from dictation are purely unintentional

## 2020-05-14 NOTE — Assessment & Plan Note (Signed)
blood pressure control important in reducing the progression of atherosclerotic disease. On appropriate oral medications.  

## 2020-05-14 NOTE — Assessment & Plan Note (Signed)
blood glucose control important in reducing the progression of atherosclerotic disease. Also, involved in wound healing. On appropriate medications.  

## 2020-05-14 NOTE — Assessment & Plan Note (Signed)
Carotid duplex today shows 1 to 39% ICA stenosis bilaterally with patent right carotid endarterectomy with a small amount of soft homogenous thrombus in the bulb that has been seen on the last several studies.  No role for intervention.  Continue current medical regimen.  Recheck in 1 year

## 2020-06-09 ENCOUNTER — Emergency Department
Admission: EM | Admit: 2020-06-09 | Discharge: 2020-06-09 | Disposition: A | Discharge status: Home / Self Care | Payer: Medicare Other | Attending: Emergency Medicine | Admitting: Emergency Medicine

## 2020-06-09 ENCOUNTER — Other Ambulatory Visit: Payer: Self-pay

## 2020-06-09 ENCOUNTER — Encounter: Payer: Self-pay | Admitting: Emergency Medicine

## 2020-06-09 DIAGNOSIS — I129 Hypertensive chronic kidney disease with stage 1 through stage 4 chronic kidney disease, or unspecified chronic kidney disease: Secondary | ICD-10-CM | POA: Insufficient documentation

## 2020-06-09 DIAGNOSIS — E1165 Type 2 diabetes mellitus with hyperglycemia: Secondary | ICD-10-CM | POA: Insufficient documentation

## 2020-06-09 DIAGNOSIS — Z85828 Personal history of other malignant neoplasm of skin: Secondary | ICD-10-CM | POA: Diagnosis not present

## 2020-06-09 DIAGNOSIS — Z794 Long term (current) use of insulin: Secondary | ICD-10-CM | POA: Diagnosis not present

## 2020-06-09 DIAGNOSIS — Z87891 Personal history of nicotine dependence: Secondary | ICD-10-CM | POA: Diagnosis not present

## 2020-06-09 DIAGNOSIS — Z91040 Latex allergy status: Secondary | ICD-10-CM | POA: Insufficient documentation

## 2020-06-09 DIAGNOSIS — E1122 Type 2 diabetes mellitus with diabetic chronic kidney disease: Secondary | ICD-10-CM | POA: Insufficient documentation

## 2020-06-09 DIAGNOSIS — Z8616 Personal history of COVID-19: Secondary | ICD-10-CM | POA: Insufficient documentation

## 2020-06-09 DIAGNOSIS — Z79899 Other long term (current) drug therapy: Secondary | ICD-10-CM | POA: Insufficient documentation

## 2020-06-09 DIAGNOSIS — Z7984 Long term (current) use of oral hypoglycemic drugs: Secondary | ICD-10-CM | POA: Diagnosis not present

## 2020-06-09 DIAGNOSIS — N189 Chronic kidney disease, unspecified: Secondary | ICD-10-CM | POA: Insufficient documentation

## 2020-06-09 DIAGNOSIS — Z7902 Long term (current) use of antithrombotics/antiplatelets: Secondary | ICD-10-CM | POA: Insufficient documentation

## 2020-06-09 DIAGNOSIS — R739 Hyperglycemia, unspecified: Secondary | ICD-10-CM | POA: Diagnosis present

## 2020-06-09 DIAGNOSIS — F0390 Unspecified dementia without behavioral disturbance: Secondary | ICD-10-CM | POA: Insufficient documentation

## 2020-06-09 LAB — COMPREHENSIVE METABOLIC PANEL
ALT: 10 U/L (ref 0–44)
AST: 17 U/L (ref 15–41)
Albumin: 2.9 g/dL — ABNORMAL LOW (ref 3.5–5.0)
Alkaline Phosphatase: 104 U/L (ref 38–126)
Anion gap: 4 — ABNORMAL LOW (ref 5–15)
BUN: 22 mg/dL (ref 8–23)
CO2: 25 mmol/L (ref 22–32)
Calcium: 8.1 mg/dL — ABNORMAL LOW (ref 8.9–10.3)
Chloride: 112 mmol/L — ABNORMAL HIGH (ref 98–111)
Creatinine, Ser: 1.1 mg/dL — ABNORMAL HIGH (ref 0.44–1.00)
GFR, Estimated: 54 mL/min — ABNORMAL LOW (ref >60)
Glucose, Bld: 217 mg/dL — ABNORMAL HIGH (ref 70–99)
Potassium: 3.9 mmol/L (ref 3.5–5.1)
Sodium: 141 mmol/L (ref 135–145)
Total Bilirubin: 0.6 mg/dL (ref 0.3–1.2)
Total Protein: 6.2 g/dL — ABNORMAL LOW (ref 6.5–8.1)

## 2020-06-09 LAB — CBC
HCT: 37.8 % (ref 36.0–46.0)
Hemoglobin: 12.4 g/dL (ref 12.0–15.0)
MCH: 30 pg (ref 26.0–34.0)
MCHC: 32.8 g/dL (ref 30.0–36.0)
MCV: 91.3 fL (ref 80.0–100.0)
Platelets: 250 10*3/uL (ref 150–400)
RBC: 4.14 MIL/uL (ref 3.87–5.11)
RDW: 13.7 % (ref 11.5–15.5)
WBC: 8.1 10*3/uL (ref 4.0–10.5)
nRBC: 0 % (ref 0.0–0.2)

## 2020-06-09 LAB — CBG MONITORING, ED: Glucose-Capillary: 224 mg/dL — ABNORMAL HIGH (ref 70–99)

## 2020-06-09 MED ORDER — SODIUM CHLORIDE 0.9 % IV BOLUS
500.0000 mL | Freq: Once | INTRAVENOUS | Status: AC
  Administered 2020-06-09: 500 mL via INTRAVENOUS

## 2020-06-09 MED ORDER — METFORMIN HCL 500 MG PO TABS: 250.0000 mg | ORAL_TABLET | Freq: Two times a day (BID) | ORAL | 0 refills | Status: AC

## 2020-06-09 NOTE — ED Triage Notes (Signed)
Pt via EMS from home. Per EMS, daughter states that pt's sugar has been high. EMS reported 255. Pt was recently taken off Metformin because it was cause it to drop too low. Pt has a hx of dementia and pt is alert to baseline. EMS placed 20G R forearm.   CBG on arrival 244.

## 2020-06-09 NOTE — ED Provider Notes (Signed)
[facility] Emergency Department Provider Note   ____________________________________________    I have reviewed the triage vital signs and the nursing notes.   HISTORY  Chief Complaint Hyperglycemia  History limited by dementia   HPI Erin Hamilton is a 70 y.o. female who presents with complaints of elevated glucose.  Patient apparently with a history of diabetes per EMS, was taken off of Metformin several months ago.  Glucose was checked today and found to be 286, EMS was called.  Patient has a history of dementia and is at her baseline, no physical complaints reported  Past Medical History:  Diagnosis Date  . Anxiety   . CVA (cerebral infarction)   . Diabetes mellitus without complication ([place])   . Hyperlipidemia   . Hypertension   . Squamous cell carcinoma of skin 07/05/2019   Upper sternum. MD [facility].  . Stroke [facility])     Patient Active Problem List   Diagnosis Date Noted  . Positive blood culture 03/30/2020  . Acute metabolic encephalopathy 37/62/8315  . Pneumonia due to COVID-19 virus 03/29/2020  . Acute hypoxemic respiratory failure due to COVID-19 ([place]) 03/28/2020  . Wheelchair bound 12/13/2019  . UTI (urinary tract infection) 11/06/2017  . Hemiparesis affecting left side as late effect of cerebrovascular accident ([place]) 06/24/2016  . Bilateral carotid artery stenosis   . H/O carotid endarterectomy   . Cognitive dysfunction due to acute cerebrovascular accident (CVA) ([place]) 06/23/2016  . H/O: stroke 11/19/2015  . Health care maintenance 11/19/2015  . Disorder of arteries and arterioles, unspecified ([place]) 11/19/2015  . Diabetes mellitus without complication ([place]) 17/61/6073  . Essential hypertension, benign 10/10/2014  . Back pain with left-sided sciatica 10/10/2014  . Acute anxiety 10/09/2014  . Stroke ([place]) 10/09/2014  . Hyperlipidemia 10/09/2014  . Depression 10/09/2014  . Hypertensive CKD (chronic kidney disease) 10/09/2014  .  Type 2 diabetes mellitus with hyperglycemia, without long-term current use of insulin ([place]) 09/22/2009  . ACUTE SINUSITIS, UNSPECIFIED 09/22/2009    Past Surgical History:  Procedure Laterality Date  . TUBAL LIGATION      Prior to Admission medications   Medication Sig Start Date End Date Taking? Authorizing Provider  metFORMIN (GLUCOPHAGE) 500 MG tablet Take 0.5 tablets (250 mg total) by mouth 2 (two) times daily with a meal. 06/09/20  Yes Lavonia Drafts, MD  acetaminophen (TYLENOL) 325 MG tablet Take 1 tablet (325 mg total) by mouth every 6 (six) hours as needed for mild pain, fever or headache (fever >/= 101). Patient not taking: Reported on 05/14/2020 04/09/20   Oswald Hillock, MD  clopidogrel (PLAVIX) 75 MG tablet TAKE 1 TABLET EVERY DAY 09/20/15   [name], NP  escitalopram (LEXAPRO) 10 MG tablet Take 1 tablet by mouth daily. 02/07/20   [provider]  gabapentin (NEURONTIN) 100 MG capsule Take 1 capsule (100 mg total) by mouth 3 (three) times daily. 04/09/20   Oswald Hillock, MD  glucose blood test strip 1 each by Other route daily. Use as instructed Patient not taking: Reported on 05/14/2020    [provider]  insulin aspart (NOVOLOG) 100 UNIT/ML injection Inject 0-9 Units into the skin 3 (three) times daily with meals. Sliding scale insulin Less than 70 initiate hypoglycemia protocol 70-120  0 units 120-150 1 unit 151-200 2 units 201-250 3 units 251-300 5 units 301-350 7 units 351-400 9 units Greater than 400 call MD Patient not taking: Reported on 05/14/2020 04/09/20   Oswald Hillock, MD  lisinopril (ZESTRIL) 10 MG tablet Take 1 tablet (10 mg total) by mouth daily. Patient not taking: Reported on 05/14/2020 04/09/20   Oswald Hillock, MD  QUEtiapine (SEROQUEL) 25 MG tablet Take 1 tablet (25 mg total) by mouth at bedtime. Patient not taking: Reported on 05/14/2020 04/09/20   Oswald Hillock, MD  simvastatin (ZOCOR) 20 MG tablet Take 20 mg by mouth daily.    [provider]      Allergies Oysters [shellfish allergy], Penicillins, and Latex  Family History  Problem Relation Age of Onset  . Stroke Father   . Heart attack Sister   . Breast cancer Sister   . Cancer Son        throat and lung  . Cancer Sister        gum    Social History Social History   Tobacco Use  . Smoking status: Former Research scientist (life sciences)  . Smokeless tobacco: Never Used  Substance Use Topics  . Alcohol use: No    Alcohol/week: 0.0 standard drinks  . Drug use: No    Review of Systems limited by dementia  Constitutional: No fever/chills Eyes: No visual changes.  ENT: No sore throat. Cardiovascular: Denies chest pain. Respiratory: Denies shortness of breath. Gastrointestinal: No abdominal pain. Genitourinary: Negative for dysuria. Musculoskeletal: Negative for back pain. Skin: Negative for rash. Neurological: Negative for headaches or weakness   ____________________________________________   PHYSICAL EXAM:  VITAL SIGNS: ED Triage Vitals  Enc Vitals Group     BP 06/09/20 1823 130/79     Pulse Rate 06/09/20 1823 76     Resp 06/09/20 1823 20     Temp 06/09/20 1823 98 F (36.7 C)     Temp Source 06/09/20 1823 Oral     SpO2 06/09/20 1823 98 %     Weight 06/09/20 1824 63.5 kg (140 lb)     Height 06/09/20 1824 1.575 m (5\' 2" )     Head Circumference --      Peak Flow --      Pain Score 06/09/20 1824 0     Pain Loc --      Pain Edu? --      Excl. in [place]? --     Constitutional: Alert . No acute distress. Pleasant  Eyes: Conjunctivae are normal.  Head: Atraumatic. Nose: No congestion/rhinnorhea. Mouth/Throat: Mucous membranes are moist.    Cardiovascular: Normal rate, regular rhythm.  Good peripheral circulation. Respiratory: Normal respiratory effort.  No retractions. Lungs CTAB. Gastrointestinal: Soft and nontender. No distention.  No CVA tenderness.  Musculoskeletal: Warm and well perfused Neurologic:  Normal speech and language. No gross focal neurologic deficits  are appreciated.  Skin:  Skin is warm, dry and intact. No rash noted. Psychiatric: Mood and affect are normal. Speech and behavior are normal.  ____________________________________________   LABS (all labs ordered are listed, but only abnormal results are displayed)  Labs Reviewed  COMPREHENSIVE METABOLIC PANEL - Abnormal; Notable for the following components:      Result Value   Chloride 112 (*)    Glucose, Bld 217 (*)    Creatinine, Ser 1.10 (*)    Calcium 8.1 (*)    Total Protein 6.2 (*)    Albumin 2.9 (*)    GFR, Estimated 54 (*)    Anion gap 4 (*)    All other components within normal limits  CBG MONITORING, ED - Abnormal; Notable for the following components:   Glucose-Capillary 224 (*)    All other components within  normal limits  CBC  URINALYSIS, COMPLETE (UACMP) WITH MICROSCOPIC   ____________________________________________  EKG  None ____________________________________________  RADIOLOGY  None ____________________________________________   PROCEDURES  Procedure(s) performed: No  Procedures   Critical Care performed: No ____________________________________________   INITIAL IMPRESSION / ASSESSMENT AND PLAN / ED COURSE  Pertinent labs & imaging results that were available during my care of the patient were reviewed by me and considered in my medical decision making (see chart for details).  Patient well-appearing in no acute distress, vital signs, exam is quite reassuring.  Fingerstick glucose here 224, pending chemistries and hematology, will give 500 cc IV bolus.   Patient's lab work is quite reassuring, glucose 217 on BMP  Metformin Rx'd could start low-dose but also reasonable to discuss with PCP if they desire as apparently patient has had side effects with Metformin in the past    ____________________________________________   FINAL CLINICAL IMPRESSION(S) / ED DIAGNOSES  Final diagnoses:  Hyperglycemia        Note:  This  document was prepared using Dragon voice recognition software and may include unintentional dictation errors.   Lavonia Drafts, MD 06/09/20 2037

## 2020-06-29 ENCOUNTER — Inpatient Hospital Stay
Admission: EM | Admit: 2020-06-29 | Discharge: 2020-07-04 | DRG: 178 | Disposition: A | Discharge status: Skilled Nursing Facility | Payer: Medicare Other | Attending: Internal Medicine | Admitting: Internal Medicine

## 2020-06-29 ENCOUNTER — Emergency Department: Payer: Medicare Other

## 2020-06-29 ENCOUNTER — Other Ambulatory Visit: Payer: Self-pay

## 2020-06-29 DIAGNOSIS — E1142 Type 2 diabetes mellitus with diabetic polyneuropathy: Secondary | ICD-10-CM | POA: Diagnosis present

## 2020-06-29 DIAGNOSIS — I69354 Hemiplegia and hemiparesis following cerebral infarction affecting left non-dominant side: Secondary | ICD-10-CM | POA: Diagnosis not present

## 2020-06-29 DIAGNOSIS — N1831 Chronic kidney disease, stage 3a: Secondary | ICD-10-CM | POA: Diagnosis present

## 2020-06-29 DIAGNOSIS — Z7984 Long term (current) use of oral hypoglycemic drugs: Secondary | ICD-10-CM

## 2020-06-29 DIAGNOSIS — Z79899 Other long term (current) drug therapy: Secondary | ICD-10-CM

## 2020-06-29 DIAGNOSIS — E1122 Type 2 diabetes mellitus with diabetic chronic kidney disease: Secondary | ICD-10-CM | POA: Diagnosis present

## 2020-06-29 DIAGNOSIS — Z91040 Latex allergy status: Secondary | ICD-10-CM | POA: Diagnosis not present

## 2020-06-29 DIAGNOSIS — R471 Dysarthria and anarthria: Secondary | ICD-10-CM | POA: Diagnosis present

## 2020-06-29 DIAGNOSIS — I129 Hypertensive chronic kidney disease with stage 1 through stage 4 chronic kidney disease, or unspecified chronic kidney disease: Secondary | ICD-10-CM | POA: Diagnosis present

## 2020-06-29 DIAGNOSIS — F419 Anxiety disorder, unspecified: Secondary | ICD-10-CM | POA: Diagnosis present

## 2020-06-29 DIAGNOSIS — Z880 Allergy status to penicillin: Secondary | ICD-10-CM | POA: Diagnosis not present

## 2020-06-29 DIAGNOSIS — Z91013 Allergy to seafood: Secondary | ICD-10-CM | POA: Diagnosis not present

## 2020-06-29 DIAGNOSIS — Z6825 Body mass index (BMI) 25.0-25.9, adult: Secondary | ICD-10-CM | POA: Diagnosis not present

## 2020-06-29 DIAGNOSIS — I639 Cerebral infarction, unspecified: Secondary | ICD-10-CM | POA: Diagnosis present

## 2020-06-29 DIAGNOSIS — R531 Weakness: Secondary | ICD-10-CM | POA: Diagnosis present

## 2020-06-29 DIAGNOSIS — E785 Hyperlipidemia, unspecified: Secondary | ICD-10-CM | POA: Diagnosis present

## 2020-06-29 DIAGNOSIS — E119 Type 2 diabetes mellitus without complications: Secondary | ICD-10-CM | POA: Diagnosis not present

## 2020-06-29 DIAGNOSIS — Z8616 Personal history of COVID-19: Secondary | ICD-10-CM | POA: Diagnosis not present

## 2020-06-29 DIAGNOSIS — F0151 Vascular dementia with behavioral disturbance: Secondary | ICD-10-CM | POA: Diagnosis present

## 2020-06-29 DIAGNOSIS — Z8249 Family history of ischemic heart disease and other diseases of the circulatory system: Secondary | ICD-10-CM | POA: Diagnosis not present

## 2020-06-29 DIAGNOSIS — Z794 Long term (current) use of insulin: Secondary | ICD-10-CM | POA: Diagnosis not present

## 2020-06-29 DIAGNOSIS — R4189 Other symptoms and signs involving cognitive functions and awareness: Secondary | ICD-10-CM | POA: Diagnosis present

## 2020-06-29 DIAGNOSIS — Z87891 Personal history of nicotine dependence: Secondary | ICD-10-CM

## 2020-06-29 DIAGNOSIS — Z85828 Personal history of other malignant neoplasm of skin: Secondary | ICD-10-CM | POA: Diagnosis not present

## 2020-06-29 DIAGNOSIS — Z7902 Long term (current) use of antithrombotics/antiplatelets: Secondary | ICD-10-CM | POA: Diagnosis not present

## 2020-06-29 DIAGNOSIS — R7989 Other specified abnormal findings of blood chemistry: Secondary | ICD-10-CM | POA: Diagnosis present

## 2020-06-29 DIAGNOSIS — U071 COVID-19: Principal | ICD-10-CM | POA: Diagnosis present

## 2020-06-29 DIAGNOSIS — R627 Adult failure to thrive: Secondary | ICD-10-CM | POA: Diagnosis present

## 2020-06-29 DIAGNOSIS — Z823 Family history of stroke: Secondary | ICD-10-CM

## 2020-06-29 LAB — RESP PANEL BY RT-PCR (FLU A&B, COVID) ARPGX2
Influenza A by PCR: NEGATIVE
Influenza B by PCR: NEGATIVE
SARS Coronavirus 2 by RT PCR: POSITIVE — AB

## 2020-06-29 LAB — HEPATIC FUNCTION PANEL
ALT: 11 U/L (ref 0–44)
AST: 21 U/L (ref 15–41)
Albumin: 3.4 g/dL — ABNORMAL LOW (ref 3.5–5.0)
Alkaline Phosphatase: 107 U/L (ref 38–126)
Bilirubin, Direct: 0.2 mg/dL (ref 0.0–0.2)
Indirect Bilirubin: 0.6 mg/dL (ref 0.3–0.9)
Total Bilirubin: 0.8 mg/dL (ref 0.3–1.2)
Total Protein: 6.8 g/dL (ref 6.5–8.1)

## 2020-06-29 LAB — HEMOGLOBIN A1C
Hgb A1c MFr Bld: 8.1 % — ABNORMAL HIGH (ref 4.8–5.6)
Mean Plasma Glucose: 185.77 mg/dL

## 2020-06-29 LAB — TROPONIN I (HIGH SENSITIVITY): Troponin I (High Sensitivity): 7 ng/L (ref <18)

## 2020-06-29 LAB — CBC
HCT: 40.6 % (ref 36.0–46.0)
Hemoglobin: 13 g/dL (ref 12.0–15.0)
MCH: 29.3 pg (ref 26.0–34.0)
MCHC: 32 g/dL (ref 30.0–36.0)
MCV: 91.4 fL (ref 80.0–100.0)
Platelets: 274 10*3/uL (ref 150–400)
RBC: 4.44 MIL/uL (ref 3.87–5.11)
RDW: 13.6 % (ref 11.5–15.5)
WBC: 10.1 10*3/uL (ref 4.0–10.5)
nRBC: 0 % (ref 0.0–0.2)

## 2020-06-29 LAB — BASIC METABOLIC PANEL
Anion gap: 8 (ref 5–15)
BUN: 16 mg/dL (ref 8–23)
CO2: 22 mmol/L (ref 22–32)
Calcium: 8.4 mg/dL — ABNORMAL LOW (ref 8.9–10.3)
Chloride: 108 mmol/L (ref 98–111)
Creatinine, Ser: 1.14 mg/dL — ABNORMAL HIGH (ref 0.44–1.00)
GFR, Estimated: 52 mL/min — ABNORMAL LOW (ref >60)
Glucose, Bld: 185 mg/dL — ABNORMAL HIGH (ref 70–99)
Potassium: 3.8 mmol/L (ref 3.5–5.1)
Sodium: 138 mmol/L (ref 135–145)

## 2020-06-29 LAB — GLUCOSE, CAPILLARY: Glucose-Capillary: 138 mg/dL — ABNORMAL HIGH (ref 70–99)

## 2020-06-29 MED ORDER — INSULIN ASPART 100 UNIT/ML IJ SOLN
0.0000 [IU] | Freq: Three times a day (TID) | INTRAMUSCULAR | Status: DC
  Administered 2020-06-29: 18:00:00 2 [IU] via SUBCUTANEOUS
  Administered 2020-07-01: 3 [IU] via SUBCUTANEOUS
  Administered 2020-07-01: 5 [IU] via SUBCUTANEOUS
  Administered 2020-07-01: 09:00:00 2 [IU] via SUBCUTANEOUS
  Administered 2020-07-02 (×2): 3 [IU] via SUBCUTANEOUS
  Administered 2020-07-02: 13:00:00 2 [IU] via SUBCUTANEOUS
  Administered 2020-07-03 (×2): 3 [IU] via SUBCUTANEOUS
  Administered 2020-07-03 – 2020-07-04 (×3): 2 [IU] via SUBCUTANEOUS
  Filled 2020-06-29 (×12): qty 1

## 2020-06-29 MED ORDER — ZINC SULFATE 220 (50 ZN) MG PO CAPS
220.0000 mg | ORAL_CAPSULE | Freq: Every day | ORAL | Status: DC
  Administered 2020-06-29 – 2020-07-01 (×3): 220 mg via ORAL
  Filled 2020-06-29 (×2): qty 1

## 2020-06-29 MED ORDER — ESCITALOPRAM OXALATE 10 MG PO TABS
10.0000 mg | ORAL_TABLET | Freq: Every day | ORAL | Status: DC
  Administered 2020-06-30 – 2020-07-04 (×5): 10 mg via ORAL
  Filled 2020-06-29 (×5): qty 1

## 2020-06-29 MED ORDER — ENOXAPARIN SODIUM 40 MG/0.4ML IJ SOSY
40.0000 mg | PREFILLED_SYRINGE | INTRAMUSCULAR | Status: DC
  Administered 2020-06-29 – 2020-07-03 (×5): 40 mg via SUBCUTANEOUS
  Filled 2020-06-29 (×5): qty 0.4

## 2020-06-29 MED ORDER — SODIUM CHLORIDE 0.9 % IV SOLN
100.0000 mg | Freq: Every day | INTRAVENOUS | Status: DC
  Administered 2020-06-30: 100 mg via INTRAVENOUS
  Filled 2020-06-29: qty 20
  Filled 2020-06-29: qty 100

## 2020-06-29 MED ORDER — ACETAMINOPHEN 325 MG PO TABS: 325.0000 mg | ORAL_TABLET | Freq: Four times a day (QID) | ORAL | Status: DC | PRN

## 2020-06-29 MED ORDER — SODIUM CHLORIDE 0.9% FLUSH: 3.0000 mL | INTRAVENOUS | Status: DC | PRN

## 2020-06-29 MED ORDER — ONDANSETRON HCL 4 MG/2ML IJ SOLN: 4.0000 mg | Freq: Four times a day (QID) | INTRAMUSCULAR | Status: DC | PRN

## 2020-06-29 MED ORDER — SODIUM CHLORIDE 0.9 % IV SOLN
200.0000 mg | Freq: Once | INTRAVENOUS | Status: AC
  Administered 2020-06-29: 19:00:00 200 mg via INTRAVENOUS
  Filled 2020-06-29: qty 40

## 2020-06-29 MED ORDER — GABAPENTIN 100 MG PO CAPS
100.0000 mg | ORAL_CAPSULE | Freq: Three times a day (TID) | ORAL | Status: DC
  Administered 2020-06-29 – 2020-07-04 (×16): 100 mg via ORAL
  Filled 2020-06-29 (×15): qty 1

## 2020-06-29 MED ORDER — ASCORBIC ACID 500 MG PO TABS
500.0000 mg | ORAL_TABLET | Freq: Every day | ORAL | Status: DC
  Administered 2020-06-29 – 2020-07-01 (×3): 500 mg via ORAL
  Filled 2020-06-29 (×2): qty 1

## 2020-06-29 MED ORDER — ONDANSETRON HCL 4 MG PO TABS: 4.0000 mg | ORAL_TABLET | Freq: Four times a day (QID) | ORAL | Status: DC | PRN

## 2020-06-29 MED ORDER — QUETIAPINE FUMARATE 25 MG PO TABS
25.0000 mg | ORAL_TABLET | Freq: Every day | ORAL | Status: DC
  Administered 2020-06-29 – 2020-07-03 (×5): 25 mg via ORAL
  Filled 2020-06-29 (×5): qty 1

## 2020-06-29 MED ORDER — SIMVASTATIN 20 MG PO TABS
20.0000 mg | ORAL_TABLET | Freq: Every day | ORAL | Status: DC
  Administered 2020-06-29 – 2020-07-04 (×6): 20 mg via ORAL
  Filled 2020-06-29 (×6): qty 1

## 2020-06-29 MED ORDER — SODIUM CHLORIDE 0.9% FLUSH
3.0000 mL | Freq: Two times a day (BID) | INTRAVENOUS | Status: DC
  Administered 2020-06-29 – 2020-07-03 (×7): 3 mL via INTRAVENOUS

## 2020-06-29 MED ORDER — CLOPIDOGREL BISULFATE 75 MG PO TABS
75.0000 mg | ORAL_TABLET | Freq: Every day | ORAL | Status: DC
  Administered 2020-06-29 – 2020-07-04 (×6): 75 mg via ORAL
  Filled 2020-06-29 (×5): qty 1

## 2020-06-29 MED ORDER — SODIUM CHLORIDE 0.9 % IV SOLN: 250.0000 mL | INTRAVENOUS | Status: DC | PRN

## 2020-06-29 NOTE — ED Provider Notes (Signed)
[facility] Emergency Department Provider Note  ____________________________________________   Event Date/Time   First MD Initiated Contact with Patient 06/29/20 1327     (approximate)  I have reviewed the triage vital signs and the nursing notes.   HISTORY  Chief Complaint Weakness    HPI RAYLEE STREHL is a 70 y.o. female with hypertension, hyperlipidemia, dementia prior stroke with some little bit of right-sided weakness who comes in for difficulty walking from baseline.  Sugar was 192 with EMS.  Initially patient was confused in triage but was moving all extremities however when I asked patient she is alert and oriented x3.  Patient reportedly lives with her daughter [name] and they attempted to contact her in triage and she stated that patient has been weaker and been unable to hold herself up.  There is also been concern of a little bit of a headache.  Patient does have a history of dementia.  Unable to get full HPI due to patient's dementia          Past Medical History:  Diagnosis Date  . Anxiety   . CVA (cerebral infarction)   . Diabetes mellitus without complication ([place])   . Hyperlipidemia   . Hypertension   . Squamous cell carcinoma of skin 07/05/2019   Upper sternum. MD [facility].  . Stroke [facility])     Patient Active Problem List   Diagnosis Date Noted  . Positive blood culture 03/30/2020  . Acute metabolic encephalopathy 51/04/5850  . Pneumonia due to COVID-19 virus 03/29/2020  . Acute hypoxemic respiratory failure due to COVID-19 ([place]) 03/28/2020  . Wheelchair bound 12/13/2019  . UTI (urinary tract infection) 11/06/2017  . Hemiparesis affecting left side as late effect of cerebrovascular accident ([place]) 06/24/2016  . Bilateral carotid artery stenosis   . H/O carotid endarterectomy   . Cognitive dysfunction due to acute cerebrovascular accident (CVA) ([place]) 06/23/2016  . H/O: stroke 11/19/2015  . Health care maintenance 11/19/2015  .  Disorder of arteries and arterioles, unspecified ([place]) 11/19/2015  . Diabetes mellitus without complication ([place]) 77/82/4235  . Essential hypertension, benign 10/10/2014  . Back pain with left-sided sciatica 10/10/2014  . Acute anxiety 10/09/2014  . Stroke ([place]) 10/09/2014  . Hyperlipidemia 10/09/2014  . Depression 10/09/2014  . Hypertensive CKD (chronic kidney disease) 10/09/2014  . Type 2 diabetes mellitus with hyperglycemia, without long-term current use of insulin ([place]) 09/22/2009  . ACUTE SINUSITIS, UNSPECIFIED 09/22/2009    Past Surgical History:  Procedure Laterality Date  . TUBAL LIGATION      Prior to Admission medications   Medication Sig Start Date End Date Taking? Authorizing Provider  acetaminophen (TYLENOL) 325 MG tablet Take 1 tablet (325 mg total) by mouth every 6 (six) hours as needed for mild pain, fever or headache (fever >/= 101). Patient not taking: Reported on 05/14/2020 04/09/20   Oswald Hillock, MD  clopidogrel (PLAVIX) 75 MG tablet TAKE 1 TABLET EVERY DAY 09/20/15   [name], NP  escitalopram (LEXAPRO) 10 MG tablet Take 1 tablet by mouth daily. 02/07/20   [provider]  gabapentin (NEURONTIN) 100 MG capsule Take 1 capsule (100 mg total) by mouth 3 (three) times daily. 04/09/20   Oswald Hillock, MD  glucose blood test strip 1 each by Other route daily. Use as instructed Patient not taking: Reported on 05/14/2020    [provider]  insulin aspart (NOVOLOG) 100 UNIT/ML injection Inject 0-9 Units into the skin 3 (three) times daily with meals.  Sliding scale insulin Less than 70 initiate hypoglycemia protocol 70-120  0 units 120-150 1 unit 151-200 2 units 201-250 3 units 251-300 5 units 301-350 7 units 351-400 9 units Greater than 400 call MD Patient not taking: Reported on 05/14/2020 04/09/20   Oswald Hillock, MD  lisinopril (ZESTRIL) 10 MG tablet Take 1 tablet (10 mg total) by mouth daily. Patient not taking: Reported on 05/14/2020 04/09/20   Oswald Hillock, MD  metFORMIN (GLUCOPHAGE) 500 MG tablet Take 0.5 tablets (250 mg total) by mouth 2 (two) times daily with a meal. 06/09/20   Lavonia Drafts, MD  QUEtiapine (SEROQUEL) 25 MG tablet Take 1 tablet (25 mg total) by mouth at bedtime. Patient not taking: Reported on 05/14/2020 04/09/20   Oswald Hillock, MD  simvastatin (ZOCOR) 20 MG tablet Take 20 mg by mouth daily.    [provider]    Allergies Oysters [shellfish allergy], Penicillins, and Latex  Family History  Problem Relation Age of Onset  . Stroke Father   . Heart attack Sister   . Breast cancer Sister   . Cancer Son        throat and lung  . Cancer Sister        gum    Social History Social History   Tobacco Use  . Smoking status: Former Research scientist (life sciences)  . Smokeless tobacco: Never Used  Substance Use Topics  . Alcohol use: No    Alcohol/week: 0.0 standard drinks  . Drug use: No      Review of Systems Unable to get full review of systems due to patient's dementia _________________________   PHYSICAL EXAM:  VITAL SIGNS: ED Triage Vitals  Enc Vitals Group     BP 06/29/20 1208 (!) 124/96     Pulse Rate 06/29/20 1208 71     Resp 06/29/20 1208 20     Temp 06/29/20 1208 98.2 F (36.8 C)     Temp Source 06/29/20 1208 Oral     SpO2 06/29/20 1208 99 %     Weight 06/29/20 1215 138 lb 14.2 oz (63 kg)     Height 06/29/20 1215 5\' 2"  (1.575 m)     Head Circumference --      Peak Flow --      Pain Score --      Pain Loc --      Pain Edu? --      Excl. in [place]? --     Constitutional: Alert and oriented x3.  Elderly appearing female Eyes: Conjunctivae are normal. EOMI. Head: Atraumatic. Nose: No congestion/rhinnorhea. Mouth/Throat: Mucous membranes are moist.   Neck: No stridor. Trachea Midline. FROM Cardiovascular: Normal rate, regular rhythm. Grossly normal heart sounds.  Good peripheral circulation. Respiratory: Normal respiratory effort.  No retractions. Lungs CTAB. Gastrointestinal: Soft and nontender. No  distention. No abdominal bruits.  Musculoskeletal: No lower extremity tenderness nor edema.  No joint effusions. Neurologic:  Normal speech and language. No gross focal neurologic deficits are appreciated.  Skin:  Skin is warm, dry and intact. No rash noted. Psychiatric: Mood and affect are normal. Speech and behavior are normal. GU: Deferred   ____________________________________________   LABS (all labs ordered are listed, but only abnormal results are displayed)  Labs Reviewed  BASIC METABOLIC PANEL - Abnormal; Notable for the following components:      Result Value   Glucose, Bld 185 (*)    Creatinine, Ser 1.14 (*)    Calcium 8.4 (*)    GFR, Estimated 52 (*)  All other components within normal limits  RESP PANEL BY RT-PCR (FLU A&B, COVID) ARPGX2  CBC  URINALYSIS, COMPLETE (UACMP) WITH MICROSCOPIC  HEPATIC FUNCTION PANEL   ____________________________________________   ED ECG REPORT I, Vanessa [place], the attending physician, personally viewed and interpreted this ECG.  Normal sinus rate of 67, no ST elevation, no T wave inversions, normal intervals ____________________________________________  RADIOLOGY Robert Bellow, personally viewed and evaluated these images (plain radiographs) as part of my medical decision making, as well as reviewing the written report by the radiologist.  ED MD interpretation: No pneumonia  Official radiology report(s): CT HEAD WO CONTRAST  Result Date: 06/29/2020 CLINICAL DATA:  Neuro deficit.  Acute stroke suspected. EXAM: CT HEAD WITHOUT CONTRAST TECHNIQUE: Contiguous axial images were obtained from the base of the skull through the vertex without intravenous contrast. COMPARISON:  March 28, 2020 FINDINGS: Brain: Scattered chronic infarcts with a large right MCA territory infarct. Ventricles and sulci are stable. Cerebellum, brainstem, and basal cisterns are normal. White matter changes are identified. No acute cortical ischemia is noted.  No mass effect or midline shift. Vascular: Calcified atherosclerosis in the intracranial carotids. Skull: Normal. Negative for fracture or focal lesion. Sinuses/Orbits: No acute finding. Other: No other abnormalities. IMPRESSION: 1. Chronic infarcts including a large right MCA territory infarct. No acute ischemia noted. No acute abnormality identified. Electronically Signed   By: Dorise Bullion III M.D   On: 06/29/2020 14:11   DG Chest Port 1 View  Result Date: 06/29/2020 CLINICAL DATA:  Weakness, COVID-19 positive EXAM: PORTABLE CHEST 1 VIEW COMPARISON:  03/28/2020 FINDINGS: Single frontal view of the chest demonstrates a stable cardiac silhouette. Hiatal hernia again noted and unchanged. Linear opacities again noted at the lung bases, likely representing scarring. No acute airspace disease, effusion, or pneumothorax. No acute bony abnormalities. IMPRESSION: 1. Stable bibasilar linear opacities most consistent with postinflammatory scarring. No acute airspace disease. 2. Hiatal hernia. Electronically Signed   By: Randa Ngo M.D.   On: 06/29/2020 15:26    ____________________________________________   PROCEDURES  Procedure(s) performed (including Critical Care):  .1-3 Lead EKG Interpretation Performed by: Vanessa [place], MD Authorized by: Vanessa [place], MD     Interpretation: normal     ECG rate:  70s    ECG rate assessment: normal     Rhythm: sinus rhythm     Ectopy: none     Conduction: normal       ____________________________________________   INITIAL IMPRESSION / ASSESSMENT AND PLAN / ED COURSE  HUNTER PINKARD was evaluated in Emergency Department on 06/29/2020 for the symptoms described in the history of present illness. She was evaluated in the context of the global COVID-19 pandemic, which necessitated consideration that the patient might be at risk for infection with the SARS-CoV-2 virus that causes COVID-19. Institutional protocols and algorithms that pertain to the  evaluation of patients at risk for COVID-19 are in a state of rapid change based on information released by regulatory bodies including the CDC and federal and state organizations. These policies and algorithms were followed during the patient's care in the ED.    Patient is a 70 year old who comes in with weakness and difficulty standing up.  Labs ordered to evaluate for electrolyte abnormalities, AKI, UTI, CT head to evaluate for mass, hemorrhage or any other acute concerns.  However does not feel like patient had any falls.  Will get COVID swab.  I was unable to contact [place] after multiple attempts.  However the triage nurse stated that she when she talked to [name] she stated that she normally lives alone but she goes there and helps her.  She said that she was more weak and could not stand up.  Here with our nurses patient states that she cannot stand up.  Given this new confusion and weakness will discuss possible team for admission         ____________________________________________   FINAL CLINICAL IMPRESSION(S) / ED DIAGNOSES   Final diagnoses:  COVID      MEDICATIONS GIVEN DURING THIS VISIT:  Medications - No data to display   ED Discharge Orders    None       Note:  This document was prepared using Dragon voice recognition software and may include unintentional dictation errors.   Vanessa [place]illa, MD 06/29/20 1539

## 2020-06-29 NOTE — ED Triage Notes (Signed)
See first nurse note, pt to ED [facility] from home for "weakness" from baseline. Chronic right side weakness.  Pt not answering questions appropriately.  Pt disoriented to person, states her name is "angela". Pt states she does not live alone, she lives "with herself".  Pt intermittently answers questions, sometimes just stares at RN.  Able to move all extremities.   Unknown LKW. Unable to get in touch with daughter [name] that lives with pt for further details.

## 2020-06-29 NOTE — ED Notes (Signed)
Spoke with Gweneth Dimitri Daughter on phone at this time. Daughter called EMS after noticing patient is weak and cannot hold herself up. Disoriented to time, oriented to self and situation baseline per daughter. Denies any falls or hitting head. Also reports she has been c/o headache the past couple of weeks. HX dementia.

## 2020-06-29 NOTE — ED Triage Notes (Signed)
Arrived by EMS from home for generalized weakness. Known right sided weakness. Today difficulty walking from baseline. EMS reports LVO screen zero. EMS blood sugar 192. EMS blood pressure 140s.

## 2020-06-29 NOTE — H&P (Signed)
History and Physical    Erin Hamilton:[id number] DOB: 1950/10/29 DOA: 06/29/2020  PCP: Kirk Ruths, MD   Patient coming from: Home  I have personally briefly reviewed patient's old medical records in [place]  Chief Complaint: Weakness  Most of the history was obtained from her daughter Erin Hamilton over the phone.  HPI: Erin Hamilton is a 70 y.o. female with medical history significant for   dementia, failure to thrive, prior history of stroke in September 2019, hyperlipidemia, diabetes mellitus, hypertension who  presented to emergency room via EMS for chief concerns of worsening weakness with decreased risk for falls. Most of the history was obtained from her daughter over the phone who stated that patient had similar symptoms in January when she was admitted for COVID-19 viral infection. She states that patient has been complaining of a headache and not feeling well but over the last couple of days she has noted that the patient has become increasingly weak and tired.  On the morning of her admission she states that the patient almost fell and she had to hold onto hand to prevent him from falling to the ground.  She called EMS for patient to the ER for further evaluation. I am unable to do review of systems on this patient due to her underlying dementia. Patient's SARS coronavirus 2 point-of-care test is positive and she is unvaccinated. Labs show sodium 138, potassium 3.8, chloride 108, bicarb 22, glucose 185, BUN 16, creatinine 1.14, calcium 8.4, alkaline phosphatase 107, albumin 3.4, AST 21, ALT 11, total protein 6.8, direct bilirubin 0.2, indirect bilirubin 0.6, troponin 7, white count 10.1, hemoglobin 13.0, hematocrit 40.6 MCV 91.4, RDW 13.6, platelet count 274 Chest x-ray reviewed by me shows stable bibasilar linear opacities most consistent with postinflammatory scarring. No acute airspace disease. Hiatal hernia. CT scan of the head without contrast shows  chronic infarcts including a large right MCA territory infarct. No acute ischemia noted. No acute abnormality identified. Twelve-lead EKG reviewed by me shows normal sinus rhythm    ED Course: Is a 70 year old Caucasian female who was brought into the ER by EMS for evaluation of generalized weakness and headaches.  Her SARS coronavirus 2 point-of-care test is positive.  Chest x-ray does not show any evidence of pneumonia patient has no hypoxia.  She will be admitted to the hospital for further evaluation.   Review of Systems: As per HPI otherwise all other systems reviewed and negative.    Past Medical History:  Diagnosis Date  . Anxiety   . CVA (cerebral infarction)   . Diabetes mellitus without complication ([place])   . Hyperlipidemia   . Hypertension   . Squamous cell carcinoma of skin 07/05/2019   Upper sternum. MD [facility].  . Stroke [place])     Past Surgical History:  Procedure Laterality Date  . TUBAL LIGATION       reports that she has quit smoking. She has never used smokeless tobacco. She reports that she does not drink alcohol and does not use drugs.  Allergies  Allergen Reactions  . Oysters [Shellfish Allergy] Anaphylaxis  . Penicillins Anaphylaxis    Has patient had a PCN reaction causing immediate rash, facial/tongue/throat swelling, SOB or lightheadedness with hypotension: Yes Has patient had a PCN reaction causing severe rash involving mucus membranes or skin necrosis: No Has patient had a PCN reaction that required hospitalization No Has patient had a PCN reaction occurring within the last 10 years: No If all of the  above answers are "NO", then may proceed with Cephalosporin use.   . Latex Rash    Family History  Problem Relation Age of Onset  . Stroke Father   . Heart attack Sister   . Breast cancer Sister   . Cancer Son        throat and lung  . Cancer Sister        gum      Prior to Admission medications   Medication Sig Start Date End Date Taking?  Authorizing Provider  acetaminophen (TYLENOL) 325 MG tablet Take 1 tablet (325 mg total) by mouth every 6 (six) hours as needed for mild pain, fever or headache (fever >/= 101). Patient not taking: Reported on 05/14/2020 04/09/20   Oswald Hillock, MD  clopidogrel (PLAVIX) 75 MG tablet TAKE 1 TABLET EVERY DAY 09/20/15   [name], NP  escitalopram (LEXAPRO) 10 MG tablet Take 1 tablet by mouth daily. 02/07/20   [provider]  gabapentin (NEURONTIN) 100 MG capsule Take 1 capsule (100 mg total) by mouth 3 (three) times daily. 04/09/20   Oswald Hillock, MD  glucose blood test strip 1 each by Other route daily. Use as instructed Patient not taking: Reported on 05/14/2020    [provider]  insulin aspart (NOVOLOG) 100 UNIT/ML injection Inject 0-9 Units into the skin 3 (three) times daily with meals. Sliding scale insulin Less than 70 initiate hypoglycemia protocol 70-120  0 units 120-150 1 unit 151-200 2 units 201-250 3 units 251-300 5 units 301-350 7 units 351-400 9 units Greater than 400 call MD Patient not taking: Reported on 05/14/2020 04/09/20   Oswald Hillock, MD  lisinopril (ZESTRIL) 10 MG tablet Take 1 tablet (10 mg total) by mouth daily. Patient not taking: Reported on 05/14/2020 04/09/20   Oswald Hillock, MD  metFORMIN (GLUCOPHAGE) 500 MG tablet Take 0.5 tablets (250 mg total) by mouth 2 (two) times daily with a meal. 06/09/20   Lavonia Drafts, MD  QUEtiapine (SEROQUEL) 25 MG tablet Take 1 tablet (25 mg total) by mouth at bedtime. Patient not taking: Reported on 05/14/2020 04/09/20   Oswald Hillock, MD  simvastatin (ZOCOR) 20 MG tablet Take 20 mg by mouth daily.    [provider]    Physical Exam: Vitals:   06/29/20 1215 06/29/20 1352 06/29/20 1400 06/29/20 1500  BP:  (!) 156/81 125/70 (!) 162/77  Pulse:  67 62 74  Resp:  18 18 18   Temp:      TempSrc:      SpO2:  95% 94% 96%  Weight: 63 kg     Height: 5\' 2"  (1.575 m)        Vitals:   06/29/20 1215 06/29/20 1352  06/29/20 1400 06/29/20 1500  BP:  (!) 156/81 125/70 (!) 162/77  Pulse:  67 62 74  Resp:  18 18 18   Temp:      TempSrc:      SpO2:  95% 94% 96%  Weight: 63 kg     Height: 5\' 2"  (1.575 m)         Constitutional: Alert and oriented to person. Not in any apparent distress HEENT:      Head: Normocephalic and atraumatic.         Eyes: PERLA, EOMI, Conjunctivae are normal. Sclera is non-icteric.       Mouth/Throat: Mucous membranes are moist.       Neck: Supple with no signs of meningismus. Cardiovascular: Regular rate and rhythm.  No murmurs, gallops, or rubs. 2+ symmetrical distal pulses are present . No JVD. No  LE edema Respiratory: Respiratory effort normal .bilateral air entry. No wheezes, crackles, or rhonchi.  Gastrointestinal: Soft, non tender, and non distended with positive bowel sounds.  Genitourinary: No CVA tenderness. Musculoskeletal: Nontender with normal range of motion in all extremities. No cyanosis, or erythema of extremities. Neurologic:  Face is symmetric. Moving all extremities.  Left-sided hemiparesis from old stroke, generalized weakness Skin: Skin is warm, dry.  No rash or ulcers Psychiatric: Flat affect   Labs on Admission: I have personally reviewed following labs and imaging studies  CBC: Recent Labs  Lab 06/29/20 1222  WBC 10.1  HGB 13.0  HCT 40.6  MCV 91.4  PLT 123456   Basic Metabolic Panel: Recent Labs  Lab 06/29/20 1222  NA 138  K 3.8  CL 108  CO2 22  GLUCOSE 185*  BUN 16  CREATININE 1.14*  CALCIUM 8.4*   GFR: Estimated Creatinine Clearance: 40.1 mL/min (A) (by C-G formula based on SCr of 1.14 mg/dL (H)). Liver Function Tests: Recent Labs  Lab 06/29/20 1222  AST 21  ALT 11  ALKPHOS 107  BILITOT 0.8  PROT 6.8  ALBUMIN 3.4*   No results for input(s): LIPASE, AMYLASE in the last 168 hours. No results for input(s): AMMONIA in the last 168 hours. Coagulation Profile: No results for input(s): INR, PROTIME in the last 168  hours. Cardiac Enzymes: No results for input(s): CKTOTAL, CKMB, CKMBINDEX, TROPONINI in the last 168 hours. BNP (last 3 results) No results for input(s): PROBNP in the last 8760 hours. HbA1C: No results for input(s): HGBA1C in the last 72 hours. CBG: No results for input(s): GLUCAP in the last 168 hours. Lipid Profile: No results for input(s): CHOL, HDL, LDLCALC, TRIG, CHOLHDL, LDLDIRECT in the last 72 hours. Thyroid Function Tests: No results for input(s): TSH, T4TOTAL, FREET4, T3FREE, THYROIDAB in the last 72 hours. Anemia Panel: No results for input(s): VITAMINB12, FOLATE, FERRITIN, TIBC, IRON, RETICCTPCT in the last 72 hours. Urine analysis:    Component Value Date/Time   COLORURINE AMBER (A) 03/27/2020 1614   APPEARANCEUR CLOUDY (A) 03/27/2020 1614   LABSPEC 1.029 03/27/2020 1614   PHURINE 5.0 03/27/2020 1614   GLUCOSEU 150 (A) 03/27/2020 1614   HGBUR MODERATE (A) 03/27/2020 1614   BILIRUBINUR NEGATIVE 03/27/2020 1614   KETONESUR 5 (A) 03/27/2020 1614   PROTEINUR 100 (A) 03/27/2020 1614   NITRITE NEGATIVE 03/27/2020 1614   LEUKOCYTESUR TRACE (A) 03/27/2020 1614    Radiological Exams on Admission: CT HEAD WO CONTRAST  Result Date: 06/29/2020 CLINICAL DATA:  Neuro deficit.  Acute stroke suspected. EXAM: CT HEAD WITHOUT CONTRAST TECHNIQUE: Contiguous axial images were obtained from the base of the skull through the vertex without intravenous contrast. COMPARISON:  March 28, 2020 FINDINGS: Brain: Scattered chronic infarcts with a large right MCA territory infarct. Ventricles and sulci are stable. Cerebellum, brainstem, and basal cisterns are normal. White matter changes are identified. No acute cortical ischemia is noted. No mass effect or midline shift. Vascular: Calcified atherosclerosis in the intracranial carotids. Skull: Normal. Negative for fracture or focal lesion. Sinuses/Orbits: No acute finding. Other: No other abnormalities. IMPRESSION: 1. Chronic infarcts including a  large right MCA territory infarct. No acute ischemia noted. No acute abnormality identified. Electronically Signed   By: Dorise Bullion III M.D   On: 06/29/2020 14:11   DG Chest Port 1 View  Result Date: 06/29/2020 CLINICAL DATA:  Weakness, COVID-19 positive EXAM: PORTABLE  CHEST 1 VIEW COMPARISON:  03/28/2020 FINDINGS: Single frontal view of the chest demonstrates a stable cardiac silhouette. Hiatal hernia again noted and unchanged. Linear opacities again noted at the lung bases, likely representing scarring. No acute airspace disease, effusion, or pneumothorax. No acute bony abnormalities. IMPRESSION: 1. Stable bibasilar linear opacities most consistent with postinflammatory scarring. No acute airspace disease. 2. Hiatal hernia. Electronically Signed   By: Randa Ngo M.D.   On: 06/29/2020 15:26     Assessment/Plan Principal Problem:   Generalized weakness Active Problems:   Diabetes mellitus without complication (HCC)   Cognitive dysfunction due to acute cerebrovascular accident (CVA) ([place])   Hemiparesis affecting left side as late effect of cerebrovascular accident ([place])   COVID-19 virus detected     Generalized weakness Most likely related to COVID-19 infection Patient has left-sided hemiparesis from a prior stroke and is at increased risk for falls We will place patient on fall precautions We will request PT evaluation    COVID-19 virus detected Patient is unvaccinated and was recently hospitalized in January for COVID-19 viral infection SARS coronavirus 2 point-of-care test is positive, chest x-ray shows no evidence of infiltrates and she is not hypoxic. Ideally patient would benefit from monoclonal antibody but this is negative metastatic patient We will place patient on remdesivir protocol Monitor inflammatory markers Pulse oximetry check every shift    Diabetes mellitus with complications of stage III chronic kidney disease Maintain consistent carbohydrate  diet Glycemic control with sliding scale insulin     Vascular dementia Continue Lexapro and Seroquel     History of CVA with left-sided hemiparesis Continue statin and Plavix      DVT prophylaxis: Lovenox  Code Status: full code Family Communication: Greater than 50% of time was spent discussing patient's condition and plan of care with her daughter, [name] over the phone.  All questions and concerns have been addressed.  She verbalizes understanding and agrees with the plan. Disposition Plan: Back to previous home environment Consults called: Physical therapy Status: At the time of admission, it appears that the appropriate admission status for this patient is inpatient. This is judged to be reasonable and necessary in order to provide the required intensity of service to ensure the patient's safety given the presenting symptoms, physical exam findings, and initial radiographic and laboratory data in the context of their comorbid conditions. Patient requires inpatient status due to high intensity of service, high risk for further deterioration and high frequency of surveillance required.    Collier Bullock MD [facility]     06/29/2020, 4:01 PM

## 2020-06-30 ENCOUNTER — Other Ambulatory Visit: Payer: Self-pay

## 2020-06-30 DIAGNOSIS — R4189 Other symptoms and signs involving cognitive functions and awareness: Secondary | ICD-10-CM

## 2020-06-30 DIAGNOSIS — E119 Type 2 diabetes mellitus without complications: Secondary | ICD-10-CM | POA: Diagnosis not present

## 2020-06-30 DIAGNOSIS — I639 Cerebral infarction, unspecified: Secondary | ICD-10-CM

## 2020-06-30 DIAGNOSIS — I69354 Hemiplegia and hemiparesis following cerebral infarction affecting left non-dominant side: Secondary | ICD-10-CM

## 2020-06-30 DIAGNOSIS — R531 Weakness: Secondary | ICD-10-CM | POA: Diagnosis not present

## 2020-06-30 DIAGNOSIS — U071 COVID-19: Principal | ICD-10-CM

## 2020-06-30 LAB — COMPREHENSIVE METABOLIC PANEL
ALT: 10 U/L (ref 0–44)
AST: 16 U/L (ref 15–41)
Albumin: 3.2 g/dL — ABNORMAL LOW (ref 3.5–5.0)
Alkaline Phosphatase: 100 U/L (ref 38–126)
Anion gap: 5 (ref 5–15)
BUN: 15 mg/dL (ref 8–23)
CO2: 25 mmol/L (ref 22–32)
Calcium: 8.5 mg/dL — ABNORMAL LOW (ref 8.9–10.3)
Chloride: 110 mmol/L (ref 98–111)
Creatinine, Ser: 1.05 mg/dL — ABNORMAL HIGH (ref 0.44–1.00)
GFR, Estimated: 57 mL/min — ABNORMAL LOW (ref >60)
Glucose, Bld: 139 mg/dL — ABNORMAL HIGH (ref 70–99)
Potassium: 3.5 mmol/L (ref 3.5–5.1)
Sodium: 140 mmol/L (ref 135–145)
Total Bilirubin: 0.6 mg/dL (ref 0.3–1.2)
Total Protein: 6.4 g/dL — ABNORMAL LOW (ref 6.5–8.1)

## 2020-06-30 LAB — CBC WITH DIFFERENTIAL/PLATELET
Abs Immature Granulocytes: 0.01 10*3/uL (ref 0.00–0.07)
Basophils Absolute: 0 10*3/uL (ref 0.0–0.1)
Basophils Relative: 1 %
Eosinophils Absolute: 0.1 10*3/uL (ref 0.0–0.5)
Eosinophils Relative: 1 %
HCT: 38.3 % (ref 36.0–46.0)
Hemoglobin: 12.4 g/dL (ref 12.0–15.0)
Immature Granulocytes: 0 %
Lymphocytes Relative: 47 %
Lymphs Abs: 4 10*3/uL (ref 0.7–4.0)
MCH: 30 pg (ref 26.0–34.0)
MCHC: 32.4 g/dL (ref 30.0–36.0)
MCV: 92.5 fL (ref 80.0–100.0)
Monocytes Absolute: 0.7 10*3/uL (ref 0.1–1.0)
Monocytes Relative: 8 %
Neutro Abs: 3.6 10*3/uL (ref 1.7–7.7)
Neutrophils Relative %: 43 %
Platelets: 257 10*3/uL (ref 150–400)
RBC: 4.14 MIL/uL (ref 3.87–5.11)
RDW: 13.7 % (ref 11.5–15.5)
WBC: 8.4 10*3/uL (ref 4.0–10.5)
nRBC: 0 % (ref 0.0–0.2)

## 2020-06-30 LAB — GLUCOSE, CAPILLARY
Glucose-Capillary: 126 mg/dL — ABNORMAL HIGH (ref 70–99)
Glucose-Capillary: 117 mg/dL — ABNORMAL HIGH (ref 70–99)
Glucose-Capillary: 110 mg/dL — ABNORMAL HIGH (ref 70–99)
Glucose-Capillary: 122 mg/dL — ABNORMAL HIGH (ref 70–99)

## 2020-06-30 LAB — FERRITIN: Ferritin: 11 ng/mL (ref 11–307)

## 2020-06-30 LAB — PHOSPHORUS: Phosphorus: 4 mg/dL (ref 2.5–4.6)

## 2020-06-30 LAB — MAGNESIUM: Magnesium: 2 mg/dL (ref 1.7–2.4)

## 2020-06-30 LAB — C-REACTIVE PROTEIN: CRP: 0.6 mg/dL (ref <1.0)

## 2020-06-30 LAB — D-DIMER, QUANTITATIVE: D-Dimer, Quant: 1.13 ug/mL-FEU — ABNORMAL HIGH (ref 0.00–0.50)

## 2020-06-30 NOTE — Progress Notes (Signed)
PROGRESS NOTE  Erin Hamilton  UUV:[id number] DOB: [birth year] DOA: 06/29/2020 PCP: Kirk Ruths, MD   Brief Narrative: Erin Hamilton is a 70 y.o. female with a history of dementia, failure to thrive, strokes HLD, HTN, T2DM, and covid-19 pneumonia March 28, 2020 who presented to the ED on 4/30 with worsening severe weakness, headache, and near falling. Work up included SARS-CoV-2 PCR which was positive (93 days after previous positive), CXR showing bibasilar opacity consistent with postinflammatory scarring without acute airspace disease. CT head showed a large remote right MCA infarct but no acute changes.  Assessment & Plan: Principal Problem:   Generalized weakness Active Problems:   Diabetes mellitus without complication (HCC)   Cognitive dysfunction due to acute cerebrovascular accident (CVA) (HCC)   Hemiparesis affecting left side as late effect of cerebrovascular accident ([place])   COVID-19 virus detected  Generalized weakness: Progressive physical deconditioning suspected in frail patient with severe cerebrovascular disease, dementia. - PT/OT evaluations requested - Fall precautions  Covid-19 test positive: Unclear significance of this finding 93 days after previous test, in the absence of other supporting information of true infection (no respiratory signs or symptoms, negative CXR, no lymphopenia, thrombocytopenia, LFT changes, or known contacts). - Will contact lab to check cycle threshold value for this test. Maintain isolation for now. - Remdesivir ordered on admission. No contraindications to this, so will not stop at this time.  - CRP pending. D-dimer elevated but in the absence of tachycardia, hypoxia, dyspnea, and with positive covid test, will not pursue further work up at this time. Troponin negative. Ferritin wnl.  History of multiple CVAs (first in 2008), carotid stenosis s/p right CEA, severe cerebrovascular disease with residual left hemiparesis, dysarthria: CT  here negative for acute changes. - Continue risk factor control as below, plavix, and statin. - Follow up with neurology, Dr. Manuella Ghazi.  Vascular dementia with behavioral disturbance:  - Continue seroquel, lexapro  T2DM with peripheral neuropathy and nephropathy: Poorly controlled with HbA1c 8.1%.  - Continue SSI, hold metformin - Continue gabapentin  Stage IIIa CKD:  - Avoid nephrotoxins  DVT prophylaxis: Lovenox Code Status: Full Family Communication: None at bedside. Daughter, [name], by phone Disposition Plan:  Status is: Inpatient  Remains inpatient appropriate because:Altered mental status and Unsafe d/c plan  Dispo: The patient is from: Home              Anticipated d/c is to: SNF              Patient currently is not medically stable to d/c.   Difficult to place patient No  Consultants:   None  Procedures:   None  Antimicrobials:  Remdesivir   Subjective: Pt poorly responsive, denies any pain, tolerating po, very weak and confused.  Objective: Vitals:   06/30/20 0054 06/30/20 0508 06/30/20 0749 06/30/20 1157  BP: (!) 142/66 116/67 (!) 154/74 (!) 179/69  Pulse: 69 70 62 69  Resp: 16 16 14 16   Temp: 98.2 F (36.8 C) 98.1 F (36.7 C) 97.8 F (36.6 C) 98.4 F (36.9 C)  TempSrc:      SpO2: 97% 95% 97% 99%  Weight:      Height:        Intake/Output Summary (Last 24 hours) at 06/30/2020 1228 Last data filed at 06/30/2020 0800 Gross per 24 hour  Intake 290 ml  Output 0 ml  Net 290 ml   Filed Weights   06/29/20 1215  Weight: 63 kg   Gen: Frail elderly female  in no distress. Pulm: Non-labored breathing. Clear to auscultation bilaterally.  CV: Regular rate and rhythm. No murmur, rub, or gallop. No JVD, no pedal edema. GI: Abdomen soft, non-tender, non-distended, with normoactive bowel sounds. No organomegaly or masses felt. Ext: Warm, no deformities Skin: No rashes, lesions or ulcers on visualized skin Neuro: Drowsy early this AM, sleeping quietly but  rouses, disoriented. Diffusely severely weak but moves right arm across body with full strength and reported sensation. Left hemiparesis noted. Psych: Judgement and insight appear impaired.  Data Reviewed: I have personally reviewed following labs and imaging studies  CBC: Recent Labs  Lab 06/29/20 1222 06/30/20 0514  WBC 10.1 8.4  NEUTROABS  --  3.6  HGB 13.0 12.4  HCT 40.6 38.3  MCV 91.4 92.5  PLT 274 751   Basic Metabolic Panel: Recent Labs  Lab 06/29/20 1222 06/30/20 0514  NA 138 140  K 3.8 3.5  CL 108 110  CO2 22 25  GLUCOSE 185* 139*  BUN 16 15  CREATININE 1.14* 1.05*  CALCIUM 8.4* 8.5*  MG  --  2.0  PHOS  --  4.0   GFR: Estimated Creatinine Clearance: 43.5 mL/min (A) (by C-G formula based on SCr of 1.05 mg/dL (H)). Liver Function Tests: Recent Labs  Lab 06/29/20 1222 06/30/20 0514  AST 21 16  ALT 11 10  ALKPHOS 107 100  BILITOT 0.8 0.6  PROT 6.8 6.4*  ALBUMIN 3.4* 3.2*   No results for input(s): LIPASE, AMYLASE in the last 168 hours. No results for input(s): AMMONIA in the last 168 hours. Coagulation Profile: No results for input(s): INR, PROTIME in the last 168 hours. Cardiac Enzymes: No results for input(s): CKTOTAL, CKMB, CKMBINDEX, TROPONINI in the last 168 hours. BNP (last 3 results) No results for input(s): PROBNP in the last 8760 hours. HbA1C: Recent Labs    06/29/20 1222  HGBA1C 8.1*   CBG: Recent Labs  Lab 06/29/20 1657 06/30/20 0755 06/30/20 1154  GLUCAP 138* 126* 117*   Lipid Profile: No results for input(s): CHOL, HDL, LDLCALC, TRIG, CHOLHDL, LDLDIRECT in the last 72 hours. Thyroid Function Tests: No results for input(s): TSH, T4TOTAL, FREET4, T3FREE, THYROIDAB in the last 72 hours. Anemia Panel: Recent Labs    06/30/20 0514  FERRITIN 11   Urine analysis:    Component Value Date/Time   COLORURINE AMBER (A) 03/27/2020 1614   APPEARANCEUR CLOUDY (A) 03/27/2020 1614   LABSPEC 1.029 03/27/2020 1614   PHURINE 5.0  03/27/2020 1614   GLUCOSEU 150 (A) 03/27/2020 1614   HGBUR MODERATE (A) 03/27/2020 1614   BILIRUBINUR NEGATIVE 03/27/2020 1614   KETONESUR 5 (A) 03/27/2020 1614   PROTEINUR 100 (A) 03/27/2020 1614   NITRITE NEGATIVE 03/27/2020 1614   LEUKOCYTESUR TRACE (A) 03/27/2020 1614   Recent Results (from the past 240 hour(s))  Resp Panel by RT-PCR (Flu A&B, Covid) Nasopharyngeal Swab     Status: Abnormal   Collection Time: 06/29/20  1:51 PM   Specimen: Nasopharyngeal Swab; Nasopharyngeal(NP) swabs in vial transport medium  Result Value Ref Range Status   SARS Coronavirus 2 by RT PCR POSITIVE (A) NEGATIVE Final    Comment: RESULT CALLED TO, READ BACK BY AND VERIFIED WITH: Laverle Patter RN AT 0258 ON 06/29/20 SNG (NOTE) SARS-CoV-2 target nucleic acids are DETECTED.  The SARS-CoV-2 RNA is generally detectable in upper respiratory specimens during the acute phase of infection. Positive results are indicative of the presence of the identified virus, but do not rule out bacterial infection or co-infection  with other pathogens not detected by the test. Clinical correlation with patient history and other diagnostic information is necessary to determine patient infection status. The expected result is Negative.  Fact Sheet for Patients: EntrepreneurPulse.com.au  Fact Sheet for Healthcare Providers: IncredibleEmployment.be  This test is not yet approved or cleared by the [place] FDA and  has been authorized for detection and/or diagnosis of SARS-CoV-2 by FDA under an Emergency Use Authorization (EUA).  This EUA will remain in effect (meaning this tes t can be used) for the duration of  the COVID-19 declaration under Section 564(b)(1) of the Act, 21 U.S.C. section 360bbb-3(b)(1), unless the authorization is terminated or revoked sooner.     Influenza A by PCR NEGATIVE NEGATIVE Final   Influenza B by PCR NEGATIVE NEGATIVE Final    Comment: (NOTE) The  Xpert Xpress SARS-CoV-2/FLU/RSV plus assay is intended as an aid in the diagnosis of influenza from Nasopharyngeal swab specimens and should not be used as a sole basis for treatment. Nasal washings and aspirates are unacceptable for Xpert Xpress SARS-CoV-2/FLU/RSV testing.  Fact Sheet for Patients: EntrepreneurPulse.com.au  Fact Sheet for Healthcare Providers: IncredibleEmployment.be  This test is not yet approved or cleared by the [place] FDA and has been authorized for detection and/or diagnosis of SARS-CoV-2 by FDA under an Emergency Use Authorization (EUA). This EUA will remain in effect (meaning this test can be used) for the duration of the COVID-19 declaration under Section 564(b)(1) of the Act, 21 U.S.C. section 360bbb-3(b)(1), unless the authorization is terminated or revoked.  Performed at [facility], [address], [place], [place] [address]       Radiology Studies: CT HEAD WO CONTRAST  Result Date: 06/29/2020 CLINICAL DATA:  Neuro deficit.  Acute stroke suspected. EXAM: CT HEAD WITHOUT CONTRAST TECHNIQUE: Contiguous axial images were obtained from the base of the skull through the vertex without intravenous contrast. COMPARISON:  March 28, 2020 FINDINGS: Brain: Scattered chronic infarcts with a large right MCA territory infarct. Ventricles and sulci are stable. Cerebellum, brainstem, and basal cisterns are normal. White matter changes are identified. No acute cortical ischemia is noted. No mass effect or midline shift. Vascular: Calcified atherosclerosis in the intracranial carotids. Skull: Normal. Negative for fracture or focal lesion. Sinuses/Orbits: No acute finding. Other: No other abnormalities. IMPRESSION: 1. Chronic infarcts including a large right MCA territory infarct. No acute ischemia noted. No acute abnormality identified. Electronically Signed   By: Dorise Bullion III M.D   On: 06/29/2020 14:11   DG Chest  Port 1 View  Result Date: 06/29/2020 CLINICAL DATA:  Weakness, COVID-19 positive EXAM: PORTABLE CHEST 1 VIEW COMPARISON:  03/28/2020 FINDINGS: Single frontal view of the chest demonstrates a stable cardiac silhouette. Hiatal hernia again noted and unchanged. Linear opacities again noted at the lung bases, likely representing scarring. No acute airspace disease, effusion, or pneumothorax. No acute bony abnormalities. IMPRESSION: 1. Stable bibasilar linear opacities most consistent with postinflammatory scarring. No acute airspace disease. 2. Hiatal hernia. Electronically Signed   By: Randa Ngo M.D.   On: 06/29/2020 15:26    Scheduled Meds: . vitamin C  500 mg Oral Daily  . clopidogrel  75 mg Oral Daily  . enoxaparin (LOVENOX) injection  40 mg Subcutaneous Q24H  . escitalopram  10 mg Oral Daily  . gabapentin  100 mg Oral TID  . insulin aspart  0-15 Units Subcutaneous TID WC  . QUEtiapine  25 mg Oral QHS  . simvastatin  20 mg Oral Daily  .  sodium chloride flush  3 mL Intravenous Q12H  . zinc sulfate  220 mg Oral Daily   Continuous Infusions: . sodium chloride    . remdesivir 100 mg in NS 100 mL 100 mg (06/30/20 0910)     LOS: 1 day   Time spent: 25 minutes.  Patrecia Pour, MD [facility] [email or web address] 06/30/2020, 12:28 PM

## 2020-06-30 NOTE — NC FL2 (Signed)
[place] LEVEL OF CARE SCREENING TOOL     IDENTIFICATION  Patient Name: Erin Hamilton Birthdate: 06-Sep-1950 Sex: female Admission Date (Current Location): 06/29/2020  [place] and [place] Number:  Engineering geologist and Address:  [facility], [address], [place], [place] [address]      Provider Number: [id number]  Attending Physician Name and Address:  Patrecia Pour, MD  Relative Name and Phone Number:  Patient's Daughter, [name] [phone])    Current Level of Care: Hospital Recommended Level of Care: [place] Prior Approval Number:    Date Approved/Denied:   PASRR Number: [id number]  Discharge Plan: SNF    Current Diagnoses: Patient Active Problem List   Diagnosis Date Noted  . Generalized weakness 06/29/2020  . COVID-19 virus detected 06/29/2020  . Positive blood culture 03/30/2020  . Acute metabolic encephalopathy 08/67/6195  . Pneumonia due to COVID-19 virus 03/29/2020  . Acute hypoxemic respiratory failure due to COVID-19 ([place]) 03/28/2020  . Wheelchair bound 12/13/2019  . UTI (urinary tract infection) 11/06/2017  . Hemiparesis affecting left side as late effect of cerebrovascular accident ([place]) 06/24/2016  . Bilateral carotid artery stenosis   . H/O carotid endarterectomy   . Cognitive dysfunction due to acute cerebrovascular accident (CVA) ([place]) 06/23/2016  . H/O: stroke 11/19/2015  . Health care maintenance 11/19/2015  . Disorder of arteries and arterioles, unspecified ([place]) 11/19/2015  . Diabetes mellitus without complication ([place]) 09/32/6712  . Essential hypertension, benign 10/10/2014  . Back pain with left-sided sciatica 10/10/2014  . Acute anxiety 10/09/2014  . Stroke ([place]) 10/09/2014  . Hyperlipidemia 10/09/2014  . Depression 10/09/2014  . Hypertensive CKD (chronic kidney disease) 10/09/2014  . Type 2 diabetes mellitus with hyperglycemia, without long-term current use of insulin  ([place]) 09/22/2009  . ACUTE SINUSITIS, UNSPECIFIED 09/22/2009    Orientation RESPIRATION BLADDER Height & Weight     Self  Normal Continent Weight: 138 lb 14.2 oz (63 kg) Height:  5\' 2"  (157.5 cm)  BEHAVIORAL SYMPTOMS/MOOD NEUROLOGICAL BOWEL NUTRITION STATUS      Continent Diet (carbohydrate diet)  AMBULATORY STATUS COMMUNICATION OF NEEDS Skin   Limited Assist Verbally Normal                       Personal Care Assistance Level of Assistance  Bathing,Feeding,Dressing,Total care Bathing Assistance: Limited assistance Feeding assistance: Limited assistance Dressing Assistance: Limited assistance Total Care Assistance: Limited assistance   Functional Limitations Info             SPECIAL CARE FACTORS FREQUENCY                       Contractures Contractures Info: Not present    Additional Factors Info  Code Status Code Status Info: Full             Current Medications (06/30/2020):  This is the current hospital active medication list Current Facility-Administered Medications  Medication Dose Route Frequency Provider Last Rate Last Admin  . 0.9 %  sodium chloride infusion  250 mL Intravenous PRN Agbata, Tochukwu, MD      . acetaminophen (TYLENOL) tablet 325 mg  325 mg Oral Q6H PRN Agbata, Tochukwu, MD      . ascorbic acid (VITAMIN C) tablet 500 mg  500 mg Oral Daily Agbata, Tochukwu, MD   500 mg at 06/30/20 0910  . clopidogrel (PLAVIX) tablet 75 mg  75 mg Oral Daily Agbata, Tochukwu, MD  75 mg at 06/30/20 0910  . enoxaparin (LOVENOX) injection 40 mg  40 mg Subcutaneous Q24H Agbata, Tochukwu, MD   40 mg at 06/29/20 1756  . escitalopram (LEXAPRO) tablet 10 mg  10 mg Oral Daily Agbata, Tochukwu, MD   10 mg at 06/30/20 0912  . gabapentin (NEURONTIN) capsule 100 mg  100 mg Oral TID Agbata, Tochukwu, MD   100 mg at 06/30/20 0910  . insulin aspart (novoLOG) injection 0-15 Units  0-15 Units Subcutaneous TID WC Agbata, Tochukwu, MD   2 Units at 06/29/20 1756  .  ondansetron (ZOFRAN) tablet 4 mg  4 mg Oral Q6H PRN Agbata, Tochukwu, MD       Or  . ondansetron (ZOFRAN) injection 4 mg  4 mg Intravenous Q6H PRN Agbata, Tochukwu, MD      . QUEtiapine (SEROQUEL) tablet 25 mg  25 mg Oral QHS Agbata, Tochukwu, MD   25 mg at 06/29/20 2042  . remdesivir 100 mg in sodium chloride 0.9 % 100 mL IVPB  100 mg Intravenous Daily Agbata, Tochukwu, MD 200 mL/hr at 06/30/20 0910 100 mg at 06/30/20 0910  . simvastatin (ZOCOR) tablet 20 mg  20 mg Oral Daily Agbata, Tochukwu, MD   20 mg at 06/30/20 0910  . sodium chloride flush (NS) 0.9 % injection 3 mL  3 mL Intravenous Q12H Agbata, Tochukwu, MD   3 mL at 06/30/20 0912  . sodium chloride flush (NS) 0.9 % injection 3 mL  3 mL Intravenous PRN Agbata, Tochukwu, MD      . zinc sulfate capsule 220 mg  220 mg Oral Daily Agbata, Tochukwu, MD   220 mg at 06/30/20 0910     Discharge Medications: Please see discharge summary for a list of discharge medications.  Relevant Imaging Results:  Relevant Lab Results:   Additional Information SS# [id number]  Trecia Rogers, LCSW

## 2020-06-30 NOTE — TOC Initial Note (Addendum)
Transition of Care [facility]) - Initial/Assessment Note    Patient Details  Name: Erin Hamilton MRN: [id number] Date of Birth: 03/25/1950  Transition of Care [facility]) CM/SW Contact:    Trecia Rogers, [place] Phone Number: 06/30/2020, 2:43 PM  Clinical Narrative:                  Patient is a 70 year old female who presented to the ED on 4/30 with worsening severe weakness, headache, and near falling. Patient had COVID in January 2022. Patient is having some altered mental status. Patient's doctor is anticipating a SNF for when the patient discharges. Patient's daughter, [name], agrees with a SNF placement. Patient was at [facility] in [place], [place] in February 2022.  Patient's daughter states that she has been going to the patient's home every day until 9pm for the past year. Patient's daughter reports that since the patient was discharged from SNF that she has been staying with the patient (since February).   PSARR #: [id number]  CSW completed FL2 and sent to doctor for co-sign. CSW sent out referrals for bed search for SNF. Will continue to check bed progression.   CSW pending disposition.   Expected Discharge Plan: Skilled Nursing Facility Barriers to Discharge: Continued Medical Work up   Patient Goals and CMS Choice Patient states their goals for this hospitalization and ongoing recovery are:: Patient's daughter stated, "I want to know what is causing this" CMS Medicare.gov Compare Post Acute Care list provided to:: Patient Represenative (must comment) (Patient's daughter, [name]) Choice offered to / list presented to : Adult Children,Patient  Expected Discharge Plan and Services Expected Discharge Plan: [place] Choice: [place] arrangements for the past 2 months: Single Family Home                                      Prior Living Arrangements/Services Living arrangements for the past 2 months: Single  Family Home Lives with:: Adult [place] Patient language and need for interpreter reviewed:: Yes        Need for Family Participation in Patient Care: Yes (Comment) (Patient's daughter) Care giver support system in place?: Yes (comment) (Patient's daughter) Current home services: DME (2 walkers and a cane) Criminal Activity/Legal Involvement Pertinent to Current Situation/Hospitalization: No - Comment as needed  Activities of Daily Living Home Assistive Devices/Equipment: Cane (specify quad or straight) ADL Screening (condition at time of admission) Patient's cognitive ability adequate to safely complete daily activities?: Yes Is the patient deaf or have difficulty hearing?: No Does the patient have difficulty seeing, even when wearing glasses/contacts?: No Does the patient have difficulty concentrating, remembering, or making decisions?: No Patient able to express need for assistance with ADLs?: Yes Does the patient have difficulty dressing or bathing?: No Independently performs ADLs?: Yes (appropriate for developmental age) Does the patient have difficulty walking or climbing stairs?: No Weakness of Legs: Left Weakness of Arms/Hands: Left  Permission Sought/Granted                  Emotional Assessment Appearance:: Other (Comment Required (Unable to assess) Attitude/Demeanor/Rapport: Unable to Assess Affect (typically observed): Unable to Assess Orientation: : Oriented to Self,Oriented to Place,Oriented to  Time,Oriented to Situation Alcohol / Substance Use: Not Applicable Psych Involvement: No (comment)  Admission diagnosis:  Generalized weakness [R53.1] COVID [U07.1] Patient Active Problem List  Diagnosis Date Noted  . Generalized weakness 06/29/2020  . COVID-19 virus detected 06/29/2020  . Positive blood culture 03/30/2020  . Acute metabolic encephalopathy 16/03/930  . Pneumonia due to COVID-19 virus 03/29/2020  . Acute hypoxemic respiratory failure due to  COVID-19 ([place]) 03/28/2020  . Wheelchair bound 12/13/2019  . UTI (urinary tract infection) 11/06/2017  . Hemiparesis affecting left side as late effect of cerebrovascular accident ([place]) 06/24/2016  . Bilateral carotid artery stenosis   . H/O carotid endarterectomy   . Cognitive dysfunction due to acute cerebrovascular accident (CVA) ([place]) 06/23/2016  . H/O: stroke 11/19/2015  . Health care maintenance 11/19/2015  . Disorder of arteries and arterioles, unspecified ([place]) 11/19/2015  . Diabetes mellitus without complication ([place]) 35/57/3220  . Essential hypertension, benign 10/10/2014  . Back pain with left-sided sciatica 10/10/2014  . Acute anxiety 10/09/2014  . Stroke ([place]) 10/09/2014  . Hyperlipidemia 10/09/2014  . Depression 10/09/2014  . Hypertensive CKD (chronic kidney disease) 10/09/2014  . Type 2 diabetes mellitus with hyperglycemia, without long-term current use of insulin ([place]) 09/22/2009  . ACUTE SINUSITIS, UNSPECIFIED 09/22/2009   PCP:  Kirk Ruths, MD Pharmacy:   [place], [place] [address] Phone: [phone] Fax: [phone]  [facility]) - [place], [place] [address] Phone: [phone] Fax: [phone]  [id number] [place] [place] [address] [place] [address] Phone: [phone] Fax: [phone]  [id number] [place], [place] [address] Phone: [phone] Fax: [phone]     Social Determinants of Health (SDOH) Interventions    Readmission Risk Interventions No flowsheet data found.

## 2020-06-30 NOTE — Evaluation (Signed)
Physical Therapy Evaluation Patient Details Name: Erin Hamilton MRN: [id number] DOB: [birth year] Today's Date: 06/30/2020   History of Present Illness  Erin Hamilton is a 70 y.o. female with a history of dementia, failure to thrive, strokes HLD, HTN, T2DM, and covid-19 pneumonia March 28, 2020 who presented to the ED on 4/30 with worsening severe weakness, headache, and near falling. Work up included SARS-CoV-2 PCR which was positive (93 days after previous positive), CXR showing bibasilar opacity consistent with postinflammatory scarring without acute airspace disease. CT head showed a large remote right MCA infarct but no acute changes.  Clinical Impression  Pt is a pleasant 70 year old female who was admitted for weakness and now covid +. Pt performs bed mobility, transfers, and limited ambulation at bedside with mod assist. L knee weak and buckles requiring therapist blocking in order for [place]. Pt unaware and impulsive demonstrating poor safety awareness. Pt demonstrates deficits with strength/mobility/cognition. Chronic L side weakness due to previous CVA. Pt is poor historian and most of history obtained from chart. Doesn't appear to be at baseline level. Would benefit from skilled PT to address above deficits and promote optimal return to PLOF; recommend transition to STR upon discharge from acute hospitalization.     Follow Up Recommendations SNF    Equipment Recommendations   (TBD)    Recommendations for Other Services       Precautions / Restrictions Precautions Precautions: Fall Restrictions Weight Bearing Restrictions: No      Mobility  Bed Mobility Overal bed mobility: Needs Assistance Bed Mobility: Supine to Sit     Supine to sit: Mod assist     General bed mobility comments: pt persistent on getting OOB towards L side, however with max assist, unable to lift trunk to sit at EOB. Attempt made to R side with mod assist and able to sit at EOB progressing to seated  static balance with supervision.    Transfers Overall transfer level: Needs assistance Equipment used: 1 person hand held assist Transfers: Sit to/from Stand Sit to Stand: Mod assist         General transfer comment: L knee buckles with standing attempts, required blocking. Able to push from bed with R arm and perform ~ 5 sit<>stand transfers.  Ambulation/Gait Ambulation/Gait assistance: Mod assist Gait Distance (Feet): 3 Feet Assistive device: 1 person hand held assist Gait Pattern/deviations: Step-to pattern;Shuffle     General Gait Details: step to gait pattern with slight shuffle/unsteady gait up towards [facility] for improved positioning. Not safe to attempt transfer to recliner due to confusion and impulsiveness. Pt agreeable to return to bed. Very high falls risk  Stairs            Wheelchair Mobility    Modified Rankin (Stroke Patients Only)       Balance Overall balance assessment: Needs assistance Sitting-balance support: Bilateral upper extremity supported Sitting balance-[name] Scale: Fair     Standing balance support: Single extremity supported Standing balance-[name] Scale: Poor                               Pertinent Vitals/Pain Pain Assessment: No/denies pain    Home Living Family/patient expects to be discharged to:: Private residence Living Arrangements: Alone Available Help at Discharge: Family;Available 24 hours/day Type of Home: House Home Access: Stairs to enter Entrance Stairs-Rails: Psychiatric nurse of Steps: 4 Home Layout: One level Home Equipment: Walker - 2 wheels Additional Comments: Pt  is poor historian, history receivied via previous admission chart.    Prior Function Level of Independence: Needs assistance   Gait / Transfers Assistance Needed: Per daughter: pt does ambulate in the home but unsure what device she uses     Comments: history obtained from previous encounter. Per patient, she nods  head in agreement to RW stating she uses at home     Hand Dominance        Extremity/Trunk Assessment   Upper Extremity Assessment Upper Extremity Assessment: Generalized weakness;LUE deficits/detail (R UE grossly 4/5) LUE Deficits / Details: Has difficulty elevating L UE above 90 degrees. States due to heaviness. Hand in fisted position, however some motion noted. Grossly 2/5; reports she is R hand dominant    Lower Extremity Assessment Lower Extremity Assessment: LLE deficits/detail;Generalized weakness (R LE grossly 4/5) LLE Deficits / Details: L LE grossly 3/5 however inconsistent with following commands for formal MMT       Communication   Communication: No difficulties  Cognition Arousal/Alertness: Awake/alert Behavior During Therapy: Impulsive Overall Cognitive Status: Impaired/Different from baseline                                 General Comments: confused, only oriented to name. Very impulsive, trying to get OOB upon entering room. Very figidity with hands      General Comments      Exercises Other Exercises Other Exercises: supine ther-ex performed on B LE including SLRs, hip abd/add, and heel slides. 10 reps with cga. Pt very figidity in bed   Assessment/Plan    PT Assessment Patient needs continued PT services  PT Problem List Decreased strength;Decreased balance;Decreased mobility;Decreased cognition;Decreased safety awareness       PT Treatment Interventions Gait training;DME instruction;Therapeutic activities;Therapeutic exercise    PT Goals (Current goals can be found in the Care Plan section)  Acute Rehab PT Goals Patient Stated Goal: to get up to EOB PT Goal Formulation: With patient Time For Goal Achievement: 07/14/20 Potential to Achieve Goals: Good    Frequency Min 2X/week   Barriers to discharge        Co-evaluation               AM-PAC PT "6 Clicks" Mobility  Outcome Measure Help needed turning from your back  to your side while in a flat bed without using bedrails?: A Lot Help needed moving from lying on your back to sitting on the side of a flat bed without using bedrails?: A Lot Help needed moving to and from a bed to a chair (including a wheelchair)?: A Lot Help needed standing up from a chair using your arms (e.g., wheelchair or bedside chair)?: A Lot Help needed to walk in hospital room?: Total Help needed climbing 3-5 steps with a railing? : Total 6 Click Score: 10    End of Session Equipment Utilized During Treatment: Gait belt Activity Tolerance:  (limited by confusion) Patient left: in bed;with bed alarm set Nurse Communication: Mobility status PT Visit Diagnosis: Unsteadiness on feet (R26.81);Muscle weakness (generalized) (M62.81);Difficulty in walking, not elsewhere classified (R26.2);Hemiplegia and hemiparesis Hemiplegia - Right/Left: Left Hemiplegia - dominant/non-dominant: Non-dominant Hemiplegia - caused by: Cerebral infarction    Time: 1110-1136 PT Time Calculation (min) (ACUTE ONLY): 26 min   Charges:   PT Evaluation $PT Eval Low Complexity: 1 Low PT Treatments $Therapeutic Exercise: 8-22 mins        Greggory Stallion, PT, DPT [phone]  Raford Brissett 06/30/2020, 2:34 PM

## 2020-07-01 DIAGNOSIS — I639 Cerebral infarction, unspecified: Secondary | ICD-10-CM | POA: Diagnosis not present

## 2020-07-01 DIAGNOSIS — E119 Type 2 diabetes mellitus without complications: Secondary | ICD-10-CM | POA: Diagnosis not present

## 2020-07-01 DIAGNOSIS — R531 Weakness: Secondary | ICD-10-CM | POA: Diagnosis not present

## 2020-07-01 DIAGNOSIS — U071 COVID-19: Secondary | ICD-10-CM | POA: Diagnosis not present

## 2020-07-01 LAB — GLUCOSE, CAPILLARY
Glucose-Capillary: 138 mg/dL — ABNORMAL HIGH (ref 70–99)
Glucose-Capillary: 204 mg/dL — ABNORMAL HIGH (ref 70–99)
Glucose-Capillary: 183 mg/dL — ABNORMAL HIGH (ref 70–99)
Glucose-Capillary: 186 mg/dL — ABNORMAL HIGH (ref 70–99)

## 2020-07-01 LAB — COMPREHENSIVE METABOLIC PANEL
ALT: 15 U/L (ref 0–44)
AST: 17 U/L (ref 15–41)
Albumin: 3.4 g/dL — ABNORMAL LOW (ref 3.5–5.0)
Alkaline Phosphatase: 105 U/L (ref 38–126)
Anion gap: 8 (ref 5–15)
BUN: 27 mg/dL — ABNORMAL HIGH (ref 8–23)
CO2: 26 mmol/L (ref 22–32)
Calcium: 8.7 mg/dL — ABNORMAL LOW (ref 8.9–10.3)
Chloride: 104 mmol/L (ref 98–111)
Creatinine, Ser: 1.18 mg/dL — ABNORMAL HIGH (ref 0.44–1.00)
GFR, Estimated: 50 mL/min — ABNORMAL LOW (ref >60)
Glucose, Bld: 131 mg/dL — ABNORMAL HIGH (ref 70–99)
Potassium: 3.8 mmol/L (ref 3.5–5.1)
Sodium: 138 mmol/L (ref 135–145)
Total Bilirubin: 0.8 mg/dL (ref 0.3–1.2)
Total Protein: 6.9 g/dL (ref 6.5–8.1)

## 2020-07-01 LAB — RESP PANEL BY RT-PCR (FLU A&B, COVID) ARPGX2
Influenza A by PCR: NEGATIVE
Influenza B by PCR: NEGATIVE
SARS Coronavirus 2 by RT PCR: NEGATIVE

## 2020-07-01 LAB — C-REACTIVE PROTEIN: CRP: 0.6 mg/dL (ref <1.0)

## 2020-07-01 MED ORDER — ADULT MULTIVITAMIN W/MINERALS CH
1.0000 | ORAL_TABLET | Freq: Every day | ORAL | Status: DC
  Administered 2020-07-01 – 2020-07-04 (×4): 1 via ORAL
  Filled 2020-07-01 (×4): qty 1

## 2020-07-01 MED ORDER — ENSURE ENLIVE PO LIQD
237.0000 mL | Freq: Two times a day (BID) | ORAL | Status: DC
  Administered 2020-07-01 – 2020-07-04 (×6): 237 mL via ORAL

## 2020-07-01 NOTE — Progress Notes (Signed)
Initial Nutrition Assessment  DOCUMENTATION CODES:  Not applicable  INTERVENTION:   Continue current diet as ordered  MVI with minerals daily  Ensure Enlive po BID, each supplement provides 350 kcal and 20 grams of protein  NUTRITION DIAGNOSIS:  Increased nutrient needs related to acute illness (COVID19) as evidenced by estimated needs.  GOAL:  Patient will meet greater than or equal to 90% of their needs  MONITOR:  PO intake,Supplement acceptance  REASON FOR ASSESSMENT:  Malnutrition Screening Tool    ASSESSMENT:  Pt presented to ED with weakness and malaise for several days. Daughter reports pt had [place] in January and acted similarly then. Pt's COVID19 test in ED remains positive. PMH relevant for dementia, FTT, stroke in September 2019, HLD, DM, HTN.  Pt sleeping at the time of visit, did not answer nutrition questions despite light touch and name being called. Breakfast tray at bedside 100% consumed. Will add nutrition supplements to aid in meeting increased needs and a MVI.  Nutritionally Relevant Scheduled Meds: . vitamin C  500 mg Oral Daily  . enoxaparin (LOVENOX) injection  40 mg Subcutaneous Q24H  . insulin aspart  0-15 Units Subcutaneous TID WC  . simvastatin  20 mg Oral Daily  . zinc sulfate  220 mg Oral Daily   Nutritionally Relevant PRN Meds: ondansetron  Labs reviewed:  BUN 27, creatinine 1.18  SBG ranges from 110-204 mg/dL over the last 24 hours  HgbA1c 8.1% (4/30)  NUTRITION - FOCUSED PHYSICAL EXAM: Flowsheet Row Most Recent Value  Orbital Region No depletion  Upper Arm Region No depletion  Thoracic and Lumbar Region No depletion  Buccal Region No depletion  Temple Region No depletion  Clavicle Bone Region No depletion  Clavicle and Acromion Bone Region No depletion  Scapular Bone Region No depletion  Dorsal Hand No depletion  Patellar Region Mild depletion  Anterior Thigh Region Mild depletion  Posterior Calf Region Mild depletion   Edema (RD Assessment) None  Hair Reviewed  Eyes Reviewed  Mouth Reviewed  Skin Reviewed  Nails Reviewed     Diet Order:   Diet Order            Diet Carb Modified Fluid consistency: Thin; Room service appropriate? Yes  Diet effective now                EDUCATION NEEDS:  No education needs have been identified at this time  Skin:  Skin Assessment: Reviewed RN Assessment  Last BM:  PTA  Height:  Ht Readings from Last 1 Encounters:  06/29/20 5\' 2"  (1.575 m)    Weight:  Wt Readings from Last 1 Encounters:  06/29/20 63 kg    Ideal Body Weight:  50 kg  BMI:  Body mass index is 25.4 kg/m.  Estimated Nutritional Needs:   Kcal:  1600-1800 kcal  Protein:  80-90 g  Fluid:  >1800 mL/d   Ranell Patrick, RD, LDN Clinical Dietitian Pager on [place]

## 2020-07-01 NOTE — Progress Notes (Signed)
PROGRESS NOTE  [name]  KPT:[id number] DOB: 04/12/1950 DOA: 06/29/2020 PCP: Kirk Ruths, MD   Brief Narrative: Erin Hamilton is a 70 y.o. female with a history of dementia, failure to thrive, strokes HLD, HTN, T2DM, and covid-19 pneumonia March 28, 2020 who presented to the ED on 4/30 with worsening severe weakness, headache, and near falling. Work up included SARS-CoV-2 PCR which was positive (93 days after previous positive), CXR showing bibasilar opacity consistent with postinflammatory scarring without acute airspace disease. CT head showed a large remote right MCA infarct but no acute changes.  Assessment & Plan: Principal Problem:   Generalized weakness Active Problems:   Diabetes mellitus without complication (HCC)   Cognitive dysfunction due to acute cerebrovascular accident (CVA) (HCC)   Hemiparesis affecting left side as late effect of cerebrovascular accident ([place])   COVID-19 virus detected  Generalized weakness: Progressive physical deconditioning suspected in frail patient with severe cerebrovascular disease, dementia. - PT/OT evaluations to continue. Planning SNF at discharge. - Fall precautions  Covid-19 test positive: Unclear significance of this finding 93 days after previous test, in the absence of other supporting information of true infection (no respiratory signs or symptoms, negative CXR, no lymphopenia, thrombocytopenia, LFT changes, or known contacts). CRP negative. Ct value is 42.9 which is very suggestive of a fale positive test, not true infection. D/w ID, Dr. Baxter Flattery. We will retest the patient. No treatment currently indicated.  - Remdesivir given x2, will stop.  - D-dimer elevated but in the absence of tachycardia, hypoxia, dyspnea, and with positive covid test, will not pursue further work up at this time. Troponin negative.   History of multiple CVAs (first in 2008), carotid stenosis s/p right CEA, severe cerebrovascular disease with residual  left hemiparesis, dysarthria: CT here negative for acute changes. - Continue risk factor control as below, plavix, and statin. - Follow up with neurology, Dr. Manuella Ghazi.  Vascular dementia with behavioral disturbance:  - Continue seroquel, lexapro  T2DM with peripheral neuropathy and nephropathy: Poorly controlled with HbA1c 8.1%.  - Continue SSI, hold metformin - Continue gabapentin  Stage IIIa CKD:  - Avoid nephrotoxins  DVT prophylaxis: Lovenox Code Status: Full Family Communication: None at bedside. Daughter, [name], by phone Disposition Plan:  Status is: Inpatient  Remains inpatient appropriate because:Altered mental status and Unsafe d/c plan  Dispo: The patient is from: Home              Anticipated d/c is to: SNF              Patient currently is not medically stable to d/c.   Difficult to place patient No  Consultants:   None  Procedures:   None  Antimicrobials:  Remdesivir   Subjective: Eating breakfast, denies pain or dyspnea. No complaints. Irritated I need to examine her during breakfast. PT recommended SNF. Has had no fever.  Objective: Vitals:   06/30/20 2011 07/01/20 0031 07/01/20 0433 07/01/20 0839  BP: (!) 160/87 121/77 (!) 153/92 (!) 156/86  Pulse: 74 81 81 71  Resp: 18 17 16 18   Temp: 98.1 F (36.7 C) 98.1 F (36.7 C) 98.4 F (36.9 C) (!) 97.3 F (36.3 C)  TempSrc:      SpO2: 99% 96% 100% 100%  Weight:      Height:        Intake/Output Summary (Last 24 hours) at 07/01/2020 1029 Last data filed at 07/01/2020 0500 Gross per 24 hour  Intake 126.46 ml  Output 250 ml  Net -  123.54 ml   Filed Weights   06/29/20 1215  Weight: 63 kg   Gen: 70 y.o. female in no distress Pulm: Nonlabored breathing room air. Clear. CV: Regular rate and rhythm. No murmur, rub, or gallop. No JVD, 1+ dependent edema. GI: Abdomen soft, non-tender, non-distended, with normoactive bowel sounds.  Ext: Warm, no deformities Skin: No significant rashes, lesions or  ulcers on visualized skin. Neuro: Alert and not oriented, not cooperative with exam, but moves all extremities, grips with left hand 4/5. Using right hand/arm with dexterity eating independently.  Psych: Judgement and insight appear impaired, sparse language. Mood euthymic & affect congruent. Behavior is appropriate.    Data Reviewed: I have personally reviewed following labs and imaging studies  CBC: Recent Labs  Lab 06/29/20 1222 06/30/20 0514  WBC 10.1 8.4  NEUTROABS  --  3.6  HGB 13.0 12.4  HCT 40.6 38.3  MCV 91.4 92.5  PLT 274 811   Basic Metabolic Panel: Recent Labs  Lab 06/29/20 1222 06/30/20 0514 07/01/20 0339  NA 138 140 138  K 3.8 3.5 3.8  CL 108 110 104  CO2 22 25 26   GLUCOSE 185* 139* 131*  BUN 16 15 27*  CREATININE 1.14* 1.05* 1.18*  CALCIUM 8.4* 8.5* 8.7*  MG  --  2.0  --   PHOS  --  4.0  --    GFR: Estimated Creatinine Clearance: 38.7 mL/min (A) (by C-G formula based on SCr of 1.18 mg/dL (H)). Liver Function Tests: Recent Labs  Lab 06/29/20 1222 06/30/20 0514 07/01/20 0339  AST 21 16 17   ALT 11 10 15   ALKPHOS 107 100 105  BILITOT 0.8 0.6 0.8  PROT 6.8 6.4* 6.9  ALBUMIN 3.4* 3.2* 3.4*   No results for input(s): LIPASE, AMYLASE in the last 168 hours. No results for input(s): AMMONIA in the last 168 hours. Coagulation Profile: No results for input(s): INR, PROTIME in the last 168 hours. Cardiac Enzymes: No results for input(s): CKTOTAL, CKMB, CKMBINDEX, TROPONINI in the last 168 hours. BNP (last 3 results) No results for input(s): PROBNP in the last 8760 hours. HbA1C: Recent Labs    06/29/20 1222  HGBA1C 8.1*   CBG: Recent Labs  Lab 06/30/20 0755 06/30/20 1154 06/30/20 1629 06/30/20 2126 07/01/20 0837  GLUCAP 126* 117* 110* 122* 138*   Lipid Profile: No results for input(s): CHOL, HDL, LDLCALC, TRIG, CHOLHDL, LDLDIRECT in the last 72 hours. Thyroid Function Tests: No results for input(s): TSH, T4TOTAL, FREET4, T3FREE, THYROIDAB  in the last 72 hours. Anemia Panel: Recent Labs    06/30/20 0514  FERRITIN 11   Urine analysis:    Component Value Date/Time   COLORURINE AMBER (A) 03/27/2020 1614   APPEARANCEUR CLOUDY (A) 03/27/2020 1614   LABSPEC 1.029 03/27/2020 1614   PHURINE 5.0 03/27/2020 1614   GLUCOSEU 150 (A) 03/27/2020 1614   HGBUR MODERATE (A) 03/27/2020 1614   BILIRUBINUR NEGATIVE 03/27/2020 1614   KETONESUR 5 (A) 03/27/2020 1614   PROTEINUR 100 (A) 03/27/2020 1614   NITRITE NEGATIVE 03/27/2020 1614   LEUKOCYTESUR TRACE (A) 03/27/2020 1614   Recent Results (from the past 240 hour(s))  Resp Panel by RT-PCR (Flu A&B, Covid) Nasopharyngeal Swab     Status: Abnormal   Collection Time: 06/29/20  1:51 PM   Specimen: Nasopharyngeal Swab; Nasopharyngeal(NP) swabs in vial transport medium  Result Value Ref Range Status   SARS Coronavirus 2 by RT PCR POSITIVE (A) NEGATIVE Final    Comment: RESULT CALLED TO, READ BACK BY  AND VERIFIED WITH: Laverle Patter RN AT 2595 ON 06/29/20 SNG (NOTE) SARS-CoV-2 target nucleic acids are DETECTED.  The SARS-CoV-2 RNA is generally detectable in upper respiratory specimens during the acute phase of infection. Positive results are indicative of the presence of the identified virus, but do not rule out bacterial infection or co-infection with other pathogens not detected by the test. Clinical correlation with patient history and other diagnostic information is necessary to determine patient infection status. The expected result is Negative.  Fact Sheet for Patients: EntrepreneurPulse.com.au  Fact Sheet for Healthcare Providers: IncredibleEmployment.be  This test is not yet approved or cleared by the [place] FDA and  has been authorized for detection and/or diagnosis of SARS-CoV-2 by FDA under an Emergency Use Authorization (EUA).  This EUA will remain in effect (meaning this tes t can be used) for the duration of  the  COVID-19 declaration under Section 564(b)(1) of the Act, 21 U.S.C. section 360bbb-3(b)(1), unless the authorization is terminated or revoked sooner.     Influenza A by PCR NEGATIVE NEGATIVE Final   Influenza B by PCR NEGATIVE NEGATIVE Final    Comment: (NOTE) The Xpert Xpress SARS-CoV-2/FLU/RSV plus assay is intended as an aid in the diagnosis of influenza from Nasopharyngeal swab specimens and should not be used as a sole basis for treatment. Nasal washings and aspirates are unacceptable for Xpert Xpress SARS-CoV-2/FLU/RSV testing.  Fact Sheet for Patients: EntrepreneurPulse.com.au  Fact Sheet for Healthcare Providers: IncredibleEmployment.be  This test is not yet approved or cleared by the [place] FDA and has been authorized for detection and/or diagnosis of SARS-CoV-2 by FDA under an Emergency Use Authorization (EUA). This EUA will remain in effect (meaning this test can be used) for the duration of the COVID-19 declaration under Section 564(b)(1) of the Act, 21 U.S.C. section 360bbb-3(b)(1), unless the authorization is terminated or revoked.  Performed at [facility], [address] [place], [place] [address]       Radiology Studies: CT HEAD WO CONTRAST  Result Date: 06/29/2020 CLINICAL DATA:  Neuro deficit.  Acute stroke suspected. EXAM: CT HEAD WITHOUT CONTRAST TECHNIQUE: Contiguous axial images were obtained from the base of the skull through the vertex without intravenous contrast. COMPARISON:  March 28, 2020 FINDINGS: Brain: Scattered chronic infarcts with a large right MCA territory infarct. Ventricles and sulci are stable. Cerebellum, brainstem, and basal cisterns are normal. White matter changes are identified. No acute cortical ischemia is noted. No mass effect or midline shift. Vascular: Calcified atherosclerosis in the intracranial carotids. Skull: Normal. Negative for fracture or focal lesion. Sinuses/Orbits:  No acute finding. Other: No other abnormalities. IMPRESSION: 1. Chronic infarcts including a large right MCA territory infarct. No acute ischemia noted. No acute abnormality identified. Electronically Signed   By: Dorise Bullion III M.D   On: 06/29/2020 14:11   DG Chest Port 1 View  Result Date: 06/29/2020 CLINICAL DATA:  Weakness, COVID-19 positive EXAM: PORTABLE CHEST 1 VIEW COMPARISON:  03/28/2020 FINDINGS: Single frontal view of the chest demonstrates a stable cardiac silhouette. Hiatal hernia again noted and unchanged. Linear opacities again noted at the lung bases, likely representing scarring. No acute airspace disease, effusion, or pneumothorax. No acute bony abnormalities. IMPRESSION: 1. Stable bibasilar linear opacities most consistent with postinflammatory scarring. No acute airspace disease. 2. Hiatal hernia. Electronically Signed   By: Randa Ngo M.D.   On: 06/29/2020 15:26    Scheduled Meds: . vitamin C  500 mg Oral Daily  . clopidogrel  75 mg  Oral Daily  . enoxaparin (LOVENOX) injection  40 mg Subcutaneous Q24H  . escitalopram  10 mg Oral Daily  . gabapentin  100 mg Oral TID  . insulin aspart  0-15 Units Subcutaneous TID WC  . QUEtiapine  25 mg Oral QHS  . simvastatin  20 mg Oral Daily  . sodium chloride flush  3 mL Intravenous Q12H  . zinc sulfate  220 mg Oral Daily   Continuous Infusions: . sodium chloride       LOS: 2 days   Time spent: 25 minutes.  Patrecia Pour, MD [facility] [email or web address] 07/01/2020, 10:29 AM

## 2020-07-01 NOTE — Plan of Care (Signed)

## 2020-07-01 NOTE — TOC Progression Note (Signed)
Transition of Care [facility]) - Progression Note    Patient Details  Name: Erin Hamilton MRN: [id number] Date of Birth: 11/09/1950  Transition of Care [facility]) CM/SW [place], RN Phone Number: 07/01/2020, 3:29 PM  Clinical Narrative:   TOC in to see patient, daughter at bedside.  Plan for patient to go to SNF, no bed offers at this time, awaiting today's PT/OT.   Patient was tested for COVID today, however patient tested negative. Daughter would like patient to go to either [facility], [facility] or [facility].  Explained that we cannot guarantee placement, daughter was understanding.   Will resubmit info to facilities and await bed offers.  TOC contact information given, TOC to follow through discharge.    Expected Discharge Plan: Skilled Nursing Facility Barriers to Discharge: Continued Medical Work up  Expected Discharge Plan and Services Expected Discharge Plan: [place]   Discharge Planning Services: CM Consult Post Acute Care Choice: [place] Living arrangements for the past 2 months: Single Family Home                 DME Arranged:  (Plan to go to SNF)         HH Arranged:  (Plan to go to SNF)           Social Determinants of Health (SDOH) Interventions    Readmission Risk Interventions Readmission Risk Prevention Plan 07/01/2020  Transportation Screening Complete  PCP or Specialist Appt within 3-5 Days Complete  HRI or Home Care Consult Complete  Social Work Consult for [place] Planning/Counseling Not Complete  SW consult not completed comments RNCM assigned  Palliative Care Screening Not Applicable  Medication Review Press photographer) Complete  Some recent data might be hidden

## 2020-07-02 ENCOUNTER — Inpatient Hospital Stay: Payer: Medicare Other

## 2020-07-02 DIAGNOSIS — I639 Cerebral infarction, unspecified: Secondary | ICD-10-CM | POA: Diagnosis not present

## 2020-07-02 DIAGNOSIS — U071 COVID-19: Secondary | ICD-10-CM | POA: Diagnosis not present

## 2020-07-02 DIAGNOSIS — E119 Type 2 diabetes mellitus without complications: Secondary | ICD-10-CM | POA: Diagnosis not present

## 2020-07-02 DIAGNOSIS — R531 Weakness: Secondary | ICD-10-CM | POA: Diagnosis not present

## 2020-07-02 LAB — COMPREHENSIVE METABOLIC PANEL
ALT: 11 U/L (ref 0–44)
AST: 18 U/L (ref 15–41)
Albumin: 3.3 g/dL — ABNORMAL LOW (ref 3.5–5.0)
Alkaline Phosphatase: 90 U/L (ref 38–126)
Anion gap: 10 (ref 5–15)
BUN: 30 mg/dL — ABNORMAL HIGH (ref 8–23)
CO2: 23 mmol/L (ref 22–32)
Calcium: 8.6 mg/dL — ABNORMAL LOW (ref 8.9–10.3)
Chloride: 105 mmol/L (ref 98–111)
Creatinine, Ser: 1.18 mg/dL — ABNORMAL HIGH (ref 0.44–1.00)
GFR, Estimated: 50 mL/min — ABNORMAL LOW (ref >60)
Glucose, Bld: 187 mg/dL — ABNORMAL HIGH (ref 70–99)
Potassium: 4 mmol/L (ref 3.5–5.1)
Sodium: 138 mmol/L (ref 135–145)
Total Bilirubin: 0.7 mg/dL (ref 0.3–1.2)
Total Protein: 6.9 g/dL (ref 6.5–8.1)

## 2020-07-02 LAB — URINALYSIS, ROUTINE W REFLEX MICROSCOPIC
Bilirubin Urine: NEGATIVE
Glucose, UA: 50 mg/dL — AB
Hgb urine dipstick: NEGATIVE
Ketones, ur: 5 mg/dL — AB
Leukocytes,Ua: NEGATIVE
Nitrite: NEGATIVE
Protein, ur: NEGATIVE mg/dL
Specific Gravity, Urine: 1.038 — ABNORMAL HIGH (ref 1.005–1.030)
pH: 5 (ref 5.0–8.0)

## 2020-07-02 LAB — GLUCOSE, CAPILLARY
Glucose-Capillary: 175 mg/dL — ABNORMAL HIGH (ref 70–99)
Glucose-Capillary: 145 mg/dL — ABNORMAL HIGH (ref 70–99)
Glucose-Capillary: 171 mg/dL — ABNORMAL HIGH (ref 70–99)
Glucose-Capillary: 221 mg/dL — ABNORMAL HIGH (ref 70–99)

## 2020-07-02 NOTE — Care Management Important Message (Signed)
Important Message  Patient Details  Name: Erin Hamilton MRN: [id number] Date of Birth: 1950/05/06   Medicare Important Message Given:  N/A - LOS <3 / Initial given by admissions     Helper 07/02/2020, 8:05 AM

## 2020-07-02 NOTE — Plan of Care (Signed)
  Problem: Education: Goal: Knowledge of General Education information will improve Description: Including pain rating scale, medication(s)/side effects and non-pharmacologic comfort measures 07/02/2020 0803 by Christoper Fabian, RN Outcome: Progressing 07/02/2020 0803 by Christoper Fabian, RN Outcome: Progressing   Problem: Health Behavior/Discharge Planning: Goal: Ability to manage health-related needs will improve 07/02/2020 0803 by Christoper Fabian, RN Outcome: Progressing 07/02/2020 0803 by Christoper Fabian, RN Outcome: Progressing   Problem: Clinical Measurements: Goal: Ability to maintain clinical measurements within normal limits will improve 07/02/2020 0803 by Christoper Fabian, RN Outcome: Progressing 07/02/2020 0803 by Christoper Fabian, RN Outcome: Progressing Goal: Will remain free from infection 07/02/2020 0803 by Christoper Fabian, RN Outcome: Progressing 07/02/2020 0803 by Christoper Fabian, RN Outcome: Progressing Goal: Diagnostic test results will improve 07/02/2020 0803 by Christoper Fabian, RN Outcome: Progressing 07/02/2020 0803 by Wendi Snipes D, RN Outcome: Progressing Goal: Respiratory complications will improve 07/02/2020 0803 by Christoper Fabian, RN Outcome: Progressing 07/02/2020 0803 by Wendi Snipes D, RN Outcome: Progressing Goal: Cardiovascular complication will be avoided 07/02/2020 0803 by Christoper Fabian, RN Outcome: Progressing 07/02/2020 0803 by Christoper Fabian, RN Outcome: Progressing   Problem: Activity: Goal: Risk for activity intolerance will decrease 07/02/2020 0803 by Christoper Fabian, RN Outcome: Progressing 07/02/2020 0803 by Christoper Fabian, RN Outcome: Progressing   Problem: Nutrition: Goal: Adequate nutrition will be maintained Outcome: Progressing   Problem: Coping: Goal: Level of anxiety will decrease Outcome: Progressing   Problem: Elimination: Goal: Will not experience complications related to bowel motility Outcome: Progressing Goal: Will  not experience complications related to urinary retention Outcome: Progressing   Problem: Pain Managment: Goal: General experience of comfort will improve Outcome: Progressing   Problem: Safety: Goal: Ability to remain free from injury will improve Outcome: Progressing   Problem: Skin Integrity: Goal: Risk for impaired skin integrity will decrease Outcome: Progressing

## 2020-07-02 NOTE — Progress Notes (Signed)
   07/02/20 1515  Clinical Encounter Type  Visited With Patient and family together  Visit Type Initial  Referral From Nurse  Consult/Referral To [place] responded to a page for room 1C-125A, Pt Erin Hamilton. Nurse stated via telephone that the pt's daughter needed someone to talk to. I went to the room but the daughter was not there. Pt was not that verbal so I stated I will come back later or tomorrow.

## 2020-07-02 NOTE — TOC Progression Note (Signed)
Transition of Care [facility]) - Progression Note    Patient Details  Name: Erin Hamilton MRN: [id number] Date of Birth: March 17, 1950  Transition of Care [facility]) CM/SW [place], RN Phone Number: 07/02/2020, 3:06 PM  Clinical Narrative:   TOC in to see patient and daughter at bedside.  Daughter anxious about patient's physical therapy and transition to SNF.  Bed offers from [facility] and [facility], daughter will tour [facility] and determine placement.   MD wrote for ultrasound of LE, PT and OT on hold until Ultrasound results are final.  Daughter aware of plan. MD aware ultrasound is delayed for later today or tomorrow. Will discharge following ultrasound results/plan and workup as necessary.  Daughter very anxious.  Discussed self care, daughter stated she was watering plants yesterday to take a break.  Chaplain paged for support through care time.  Daughter amenable. TOC contact information given.  TOC to follow through discharge.    Expected Discharge Plan: Skilled Nursing Facility Barriers to Discharge: Continued Medical Work up  Expected Discharge Plan and Services Expected Discharge Plan: [place]   Discharge Planning Services: CM Consult Post Acute Care Choice: [place] Living arrangements for the past 2 months: Single Family Home Expected Discharge Date: 07/02/20               DME Arranged:  (Plan to go to SNF)         HH Arranged:  (Plan to go to SNF)           Social Determinants of Health (SDOH) Interventions    Readmission Risk Interventions Readmission Risk Prevention Plan 07/01/2020  Transportation Screening Complete  PCP or Specialist Appt within 3-5 Days Complete  HRI or Home Care Consult Complete  Social Work Consult for [place] Planning/Counseling Not Complete  SW consult not completed comments RNCM assigned  Palliative Care Screening Not Applicable  Medication Review Press photographer) Complete  Some  recent data might be hidden

## 2020-07-02 NOTE — Progress Notes (Signed)
PT Cancellation Note  Patient Details Name: DELAYNI STREED MRN: [id number] DOB: [birth year]   Cancelled Treatment:     PT attempt. Awaiting results of [place] to rule out DVT. Will return once cleared.    Willette Pa 07/02/2020, 12:46 PM

## 2020-07-02 NOTE — Progress Notes (Signed)
PROGRESS NOTE  MACALA BALDONADO  QQI:[id number] DOB: 03-Aug-1950 DOA: 06/29/2020 PCP: Kirk Ruths, MD   Brief Narrative: KALISTA LAGUARDIA is a 70 y.o. female with a history of dementia, failure to thrive, strokes HLD, HTN, T2DM, and covid-19 pneumonia March 28, 2020 who presented to the ED on 4/30 with worsening severe weakness, headache, and near falling. Work up included SARS-CoV-2 PCR which was positive (93 days after previous positive), CXR showing bibasilar opacity consistent with postinflammatory scarring without acute airspace disease. CT head showed a large remote right MCA infarct but no acute changes. The patient was afebrile without leukocytosis. Admitted for weakness with PT now recommending SNF.  Assessment & Plan: Principal Problem:   Generalized weakness Active Problems:   Diabetes mellitus without complication (HCC)   Cognitive dysfunction due to acute cerebrovascular accident (CVA) (HCC)   Hemiparesis affecting left side as late effect of cerebrovascular accident ([place])   COVID-19 virus detected  Generalized weakness: Progressive physical deconditioning suspected in frail patient with severe cerebrovascular disease, dementia. - PT/OT evaluations to continue. TOC assisting with pursuing SNF at discharge. - Fall precautions - Daughter reports history of UTI. UA never collected. D/w RN who will send sample. Pt cannot reliably report symptoms due to dementia.  Covid-19 test positive, repeat negative: Suspect false positive initially 93 days after previous test, in the absence of other supporting information of true infection (no respiratory signs or symptoms, nonacute CXR, no lymphopenia, thrombocytopenia, LFT changes, or known contacts). CRP negative. Ct value is 42.9 which is very suggestive of a fale positive test, not true infection. D/w ID, Dr. Baxter Flattery. Can DC isolation, no need for treatment.  Elevated d-dimer:  - Check venous U/S. No clinical evidence of PE.  History  of multiple CVAs (first in 2008), carotid stenosis s/p right CEA, severe cerebrovascular disease with residual left hemiparesis, dysarthria: CT here negative for acute changes. - Continue risk factor control as below, plavix, and statin. - Follow up with neurology, Dr. Manuella Ghazi.  Vascular dementia with behavioral disturbance:  - Continue seroquel, lexapro  T2DM with peripheral neuropathy and nephropathy: Poorly controlled with HbA1c 8.1%.  - Continue SSI, hold metformin - Continue gabapentin  Stage IIIa CKD:  - Avoid nephrotoxins  DVT prophylaxis: Lovenox Code Status: Full Family Communication: None at bedside. Spoke with daughter 5/1, 5/2. Disposition Plan:  Status is: Inpatient  Remains inpatient appropriate because:Altered mental status and Unsafe d/c plan  Dispo: The patient is from: Home              Anticipated d/c is to: SNF              Patient currently is not medically stable to d/c.   Difficult to place patient No  Consultants:   None  Procedures:   None  Antimicrobials:  Remdesivir x2 doses  Subjective: No complaints. Sitting up eating breakfast independently, does not speak to me or verbally answer questions.  Objective: Vitals:   07/01/20 1949 07/01/20 2307 07/02/20 0333 07/02/20 0700  BP: (!) 149/74 (!) 145/74 (!) 161/79 (!) 158/76  Pulse: 86 80 98 96  Resp: 17 18 16 16   Temp: 98.4 F (36.9 C) 98.8 F (37.1 C) 98.1 F (36.7 C) 98.1 F (36.7 C)  TempSrc: Oral  Oral Oral  SpO2: 94% 94% 95% 95%  Weight:      Height:        Intake/Output Summary (Last 24 hours) at 07/02/2020 1206 Last data filed at 07/02/2020 0955 Gross per 24  hour  Intake 153 ml  Output --  Net 153 ml   Filed Weights   06/29/20 1215  Weight: 63 kg   Gen: Frail female in no distress Pulm: Nonlabored breathing room air. Clear. CV: Regular rate and rhythm. No murmur, rub, or gallop. No JVD, no dependent edema. GI: Abdomen soft, non-tender, non-distended, with normoactive bowel  sounds.  Ext: Warm, no deformities Skin: No new rashes, lesions or ulcers on visualized skin. Neuro: Alert and oriented x1, unable to fully evaluate due to poor cooperation. Psych: Judgement and insight appear impaired.  Data Reviewed: I have personally reviewed following labs and imaging studies  CBC: Recent Labs  Lab 06/29/20 1222 06/30/20 0514  WBC 10.1 8.4  NEUTROABS  --  3.6  HGB 13.0 12.4  HCT 40.6 38.3  MCV 91.4 92.5  PLT 274 716   Basic Metabolic Panel: Recent Labs  Lab 06/29/20 1222 06/30/20 0514 07/01/20 0339 07/02/20 0508  NA 138 140 138 138  K 3.8 3.5 3.8 4.0  CL 108 110 104 105  CO2 22 25 26 23   GLUCOSE 185* 139* 131* 187*  BUN 16 15 27* 30*  CREATININE 1.14* 1.05* 1.18* 1.18*  CALCIUM 8.4* 8.5* 8.7* 8.6*  MG  --  2.0  --   --   PHOS  --  4.0  --   --    GFR: Estimated Creatinine Clearance: 38.7 mL/min (A) (by C-G formula based on SCr of 1.18 mg/dL (H)). Liver Function Tests: Recent Labs  Lab 06/29/20 1222 06/30/20 0514 07/01/20 0339 07/02/20 0508  AST 21 16 17 18   ALT 11 10 15 11   ALKPHOS 107 100 105 90  BILITOT 0.8 0.6 0.8 0.7  PROT 6.8 6.4* 6.9 6.9  ALBUMIN 3.4* 3.2* 3.4* 3.3*   No results for input(s): LIPASE, AMYLASE in the last 168 hours. No results for input(s): AMMONIA in the last 168 hours. Coagulation Profile: No results for input(s): INR, PROTIME in the last 168 hours. Cardiac Enzymes: No results for input(s): CKTOTAL, CKMB, CKMBINDEX, TROPONINI in the last 168 hours. BNP (last 3 results) No results for input(s): PROBNP in the last 8760 hours. HbA1C: Recent Labs    06/29/20 1222  HGBA1C 8.1*   CBG: Recent Labs  Lab 07/01/20 0837 07/01/20 1201 07/01/20 1614 07/01/20 2140 07/02/20 0821  GLUCAP 138* 204* 183* 186* 175*   Lipid Profile: No results for input(s): CHOL, HDL, LDLCALC, TRIG, CHOLHDL, LDLDIRECT in the last 72 hours. Thyroid Function Tests: No results for input(s): TSH, T4TOTAL, FREET4, T3FREE, THYROIDAB in  the last 72 hours. Anemia Panel: Recent Labs    06/30/20 0514  FERRITIN 11   Urine analysis:    Component Value Date/Time   COLORURINE AMBER (A) 03/27/2020 1614   APPEARANCEUR CLOUDY (A) 03/27/2020 1614   LABSPEC 1.029 03/27/2020 1614   PHURINE 5.0 03/27/2020 1614   GLUCOSEU 150 (A) 03/27/2020 1614   HGBUR MODERATE (A) 03/27/2020 1614   BILIRUBINUR NEGATIVE 03/27/2020 1614   KETONESUR 5 (A) 03/27/2020 1614   PROTEINUR 100 (A) 03/27/2020 1614   NITRITE NEGATIVE 03/27/2020 1614   LEUKOCYTESUR TRACE (A) 03/27/2020 1614   Recent Results (from the past 240 hour(s))  Resp Panel by RT-PCR (Flu A&B, Covid) Nasopharyngeal Swab     Status: Abnormal   Collection Time: 06/29/20  1:51 PM   Specimen: Nasopharyngeal Swab; Nasopharyngeal(NP) swabs in vial transport medium  Result Value Ref Range Status   SARS Coronavirus 2 by RT PCR POSITIVE (A) NEGATIVE Final  Comment: RESULT CALLED TO, READ BACK BY AND VERIFIED WITH: Laverle Patter RN AT [id number] ON 06/29/20 SNG (NOTE) SARS-CoV-2 target nucleic acids are DETECTED.  The SARS-CoV-2 RNA is generally detectable in upper respiratory specimens during the acute phase of infection. Positive results are indicative of the presence of the identified virus, but do not rule out bacterial infection or co-infection with other pathogens not detected by the test. Clinical correlation with patient history and other diagnostic information is necessary to determine patient infection status. The expected result is Negative.  Fact Sheet for Patients: EntrepreneurPulse.com.au  Fact Sheet for Healthcare Providers: IncredibleEmployment.be  This test is not yet approved or cleared by the [place] FDA and  has been authorized for detection and/or diagnosis of SARS-CoV-2 by FDA under an Emergency Use Authorization (EUA).  This EUA will remain in effect (meaning this tes t can be used) for the duration of  the COVID-19  declaration under Section 564(b)(1) of the Act, 21 U.S.C. section 360bbb-3(b)(1), unless the authorization is terminated or revoked sooner.     Influenza A by PCR NEGATIVE NEGATIVE Final   Influenza B by PCR NEGATIVE NEGATIVE Final    Comment: (NOTE) The Xpert Xpress SARS-CoV-2/FLU/RSV plus assay is intended as an aid in the diagnosis of influenza from Nasopharyngeal swab specimens and should not be used as a sole basis for treatment. Nasal washings and aspirates are unacceptable for Xpert Xpress SARS-CoV-2/FLU/RSV testing.  Fact Sheet for Patients: EntrepreneurPulse.com.au  Fact Sheet for Healthcare Providers: IncredibleEmployment.be  This test is not yet approved or cleared by the [place] FDA and has been authorized for detection and/or diagnosis of SARS-CoV-2 by FDA under an Emergency Use Authorization (EUA). This EUA will remain in effect (meaning this test can be used) for the duration of the COVID-19 declaration under Section 564(b)(1) of the Act, 21 U.S.C. section 360bbb-3(b)(1), unless the authorization is terminated or revoked.  Performed at [facility], [address] [place], [place] [address]   Resp Panel by RT-PCR (Flu A&B, Covid) Nasopharyngeal Swab     Status: None   Collection Time: 07/01/20 11:52 AM   Specimen: Nasopharyngeal Swab; Nasopharyngeal(NP) swabs in vial transport medium  Result Value Ref Range Status   SARS Coronavirus 2 by RT PCR NEGATIVE NEGATIVE Final    Comment: (NOTE) SARS-CoV-2 target nucleic acids are NOT DETECTED.  The SARS-CoV-2 RNA is generally detectable in upper respiratory specimens during the acute phase of infection. The lowest concentration of SARS-CoV-2 viral copies this assay can detect is 138 copies/mL. A negative result does not preclude SARS-Cov-2 infection and should not be used as the sole basis for treatment or other patient management decisions. A negative result may  occur with  improper specimen collection/handling, submission of specimen other than nasopharyngeal swab, presence of viral mutation(s) within the areas targeted by this assay, and inadequate number of viral copies(<138 copies/mL). A negative result must be combined with clinical observations, patient history, and epidemiological information. The expected result is Negative.  Fact Sheet for Patients:  EntrepreneurPulse.com.au  Fact Sheet for Healthcare Providers:  IncredibleEmployment.be  This test is no t yet approved or cleared by the [place] FDA and  has been authorized for detection and/or diagnosis of SARS-CoV-2 by FDA under an Emergency Use Authorization (EUA). This EUA will remain  in effect (meaning this test can be used) for the duration of the COVID-19 declaration under Section 564(b)(1) of the Act, 21 U.S.C.section 360bbb-3(b)(1), unless the authorization is terminated  or revoked sooner.  Influenza A by PCR NEGATIVE NEGATIVE Final   Influenza B by PCR NEGATIVE NEGATIVE Final    Comment: (NOTE) The Xpert Xpress SARS-CoV-2/FLU/RSV plus assay is intended as an aid in the diagnosis of influenza from Nasopharyngeal swab specimens and should not be used as a sole basis for treatment. Nasal washings and aspirates are unacceptable for Xpert Xpress SARS-CoV-2/FLU/RSV testing.  Fact Sheet for Patients: EntrepreneurPulse.com.au  Fact Sheet for Healthcare Providers: IncredibleEmployment.be  This test is not yet approved or cleared by the [place] FDA and has been authorized for detection and/or diagnosis of SARS-CoV-2 by FDA under an Emergency Use Authorization (EUA). This EUA will remain in effect (meaning this test can be used) for the duration of the COVID-19 declaration under Section 564(b)(1) of the Act, 21 U.S.C. section 360bbb-3(b)(1), unless the authorization is terminated  or revoked.  Performed at [facility], [address] [place], [place] [address]       Radiology Studies: No results found.  Scheduled Meds: . clopidogrel  75 mg Oral Daily  . enoxaparin (LOVENOX) injection  40 mg Subcutaneous Q24H  . escitalopram  10 mg Oral Daily  . feeding supplement  237 mL Oral BID BM  . gabapentin  100 mg Oral TID  . insulin aspart  0-15 Units Subcutaneous TID WC  . multivitamin with minerals  1 tablet Oral Daily  . QUEtiapine  25 mg Oral QHS  . simvastatin  20 mg Oral Daily  . sodium chloride flush  3 mL Intravenous Q12H   Continuous Infusions: . sodium chloride       LOS: 3 days   Time spent: 25 minutes.  Patrecia Pour, MD [facility] [email or web address] 07/02/2020, 12:06 PM

## 2020-07-02 NOTE — Progress Notes (Addendum)
OT Cancellation Note  Patient Details Name: Erin Hamilton MRN: [id number] DOB: Mar 22, 1950   Cancelled Treatment:    Reason Eval/Treat Not Completed: Fatigue/lethargy limiting ability to participate;Patient declined, no reason specified. Consult received, chart reviewed. Upon attempt, pt sleeping, wakes to moderate verbal cues. Oriented to self and place, states she's here for her tumor. Pt declines OOB, EOB, and bed level grooming tasks. Agreeable to OT re-attempting at later time. Will re-attempt OT evaluation as schedule permits.  Addendum, 12:22pm: Pt now pending ultrasound to rule out DVT. Will hold OT evaluation and re-attempt pending ultrasound results.  Hanley Hays, MPH, MS, OTR/L ascom [phone] 07/02/20, 9:35 AM

## 2020-07-03 DIAGNOSIS — I69354 Hemiplegia and hemiparesis following cerebral infarction affecting left non-dominant side: Secondary | ICD-10-CM | POA: Diagnosis not present

## 2020-07-03 DIAGNOSIS — U071 COVID-19: Secondary | ICD-10-CM | POA: Diagnosis not present

## 2020-07-03 DIAGNOSIS — R531 Weakness: Secondary | ICD-10-CM | POA: Diagnosis not present

## 2020-07-03 DIAGNOSIS — I639 Cerebral infarction, unspecified: Secondary | ICD-10-CM | POA: Diagnosis not present

## 2020-07-03 LAB — GLUCOSE, CAPILLARY
Glucose-Capillary: 148 mg/dL — ABNORMAL HIGH (ref 70–99)
Glucose-Capillary: 175 mg/dL — ABNORMAL HIGH (ref 70–99)
Glucose-Capillary: 174 mg/dL — ABNORMAL HIGH (ref 70–99)
Glucose-Capillary: 226 mg/dL — ABNORMAL HIGH (ref 70–99)

## 2020-07-03 NOTE — TOC Progression Note (Signed)
Transition of Care [facility]) - Progression Note    Patient Details  Name: Erin Hamilton MRN: [id number] Date of Birth: [birth year]  Transition of Care [facility]) CM/SW [place], RN Phone Number: 07/03/2020, 4:52 PM  Clinical Narrative:    SNF authorization started via [employer] for anticipated transfer 07/04/20 to [facility]. Pending repeat COVID prior to transfer.    Expected Discharge Plan: Skilled Nursing Facility Barriers to Discharge: Continued Medical Work up  Expected Discharge Plan and Services Expected Discharge Plan: [place]   Discharge Planning Services: CM Consult Post Acute Care Choice: [place] Living arrangements for the past 2 months: Single Family Home Expected Discharge Date: 07/02/20               DME Arranged:  (Plan to go to SNF)         HH Arranged:  (Plan to go to SNF)           Social Determinants of Health (SDOH) Interventions    Readmission Risk Interventions Readmission Risk Prevention Plan 07/01/2020  Transportation Screening Complete  PCP or Specialist Appt within 3-5 Days Complete  HRI or Home Care Consult Complete  Social Work Consult for [place] Planning/Counseling Not Complete  SW consult not completed comments RNCM assigned  Palliative Care Screening Not Applicable  Medication Review [employer] photographer) Complete  Some recent data might be hidden

## 2020-07-03 NOTE — Progress Notes (Signed)
PROGRESS NOTE    Erin Hamilton  ALP:[id number] DOB: 06-Mar-1950 DOA: 06/29/2020 PCP: Kirk Ruths, MD   Chief complaint.  Generalized weakness. Brief Narrative:  Erin Hamilton is a 70 y.o. female with a history of dementia, failure to thrive, strokes HLD, HTN, T2DM, and covid-19 pneumonia March 28, 2020 who presented to the ED on 4/30 with worsening severe weakness, headache, and near falling. Work up included SARS-CoV-2 PCR which was positive (93 days after previous positive), CXR showing bibasilar opacity consistent with postinflammatory scarring without acute airspace disease. CT head showed a large remote right MCA infarct but no acute changes. The patient was afebrile without leukocytosis. Admitted for weakness with PT now recommending SNF.   Assessment & Plan:   Principal Problem:   Generalized weakness Active Problems:   Diabetes mellitus without complication (HCC)   Cognitive dysfunction due to acute cerebrovascular accident (CVA) ([facility])   Hemiparesis affecting left side as late effect of cerebrovascular accident ([place])   COVID-19 virus detected  #1.  COVID infection. Patient does not have any hypoxemia, no respiratory symptoms and No need treatment.  2.  Generalized weakness. Failure to thrive. Patient has been seen by PT/OT. Family has not been able to make a decision about which nursing to go.  But she is medically stable to be discharged to this point.  3.  History of CVA with left hemiparesis. Dysarthria. Vascular dementia. At baseline.  #4.  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 Chronic kidney disease stage IIIa. Hemoglobin A1c 8.1. Continue sliding scale insulin.     DVT prophylaxis: Lovenox Code Status: Full Family Communication: Daughter updated. Disposition Plan:  .   Status is: Inpatient  Remains inpatient appropriate because:Unsafe d/c plan   Dispo: The patient is from: Home              Anticipated d/c is to:  SNF              Patient currently is medically stable to d/c.   Difficult to place patient No        I/O last 3 completed shifts: In: 153 [P.O.:150; I.V.:3] Out: 550 [Urine:550] No intake/output data recorded.     Consultants:   None  Procedures: None  Antimicrobials: None  Subjective: Patient has some baseline confusion, no agitation. Denies any short of breath or cough. No abdominal pain or nausea vomiting. No dysuria hematuria pain No fever chills  No chest pain palpitation.  Objective: Vitals:   07/03/20 0024 07/03/20 0454 07/03/20 0735 07/03/20 1145  BP: (!) 159/83 (!) 159/87 (!) 155/88 138/75  Pulse: 83 75 75 79  Resp: 19 14 17 17   Temp: 98.2 F (36.8 C) 98 F (36.7 C) 98.2 F (36.8 C) 98.7 F (37.1 C)  TempSrc:   Oral Oral  SpO2: 94% 96% 96% 93%  Weight:      Height:        Intake/Output Summary (Last 24 hours) at 07/03/2020 1520 Last data filed at 07/03/2020 0447 Gross per 24 hour  Intake --  Output 550 ml  Net -550 ml   Filed Weights   06/29/20 1215  Weight: 63 kg    Examination:  General exam: Appears calm and comfortable  Respiratory system: Clear to auscultation. Respiratory effort normal. Cardiovascular system: S1 & S2 heard, RRR. No JVD, murmurs, rubs, gallops or clicks. No pedal edema. Gastrointestinal system: Abdomen is nondistended, soft and nontender. No organomegaly or masses felt. Normal bowel sounds heard. Central nervous system:  Alert and oriented x1.  Left hemiparesis. Extremities: Symmetric 5 x 5 power. Skin: No rashes, lesions or ulcers Psychiatry:Mood & affect appropriate.     Data Reviewed: I have personally reviewed following labs and imaging studies  CBC: Recent Labs  Lab 06/29/20 1222 06/30/20 0514  WBC 10.1 8.4  NEUTROABS  --  3.6  HGB 13.0 12.4  HCT 40.6 38.3  MCV 91.4 92.5  PLT 274 188   Basic Metabolic Panel: Recent Labs  Lab 06/29/20 1222 06/30/20 0514 07/01/20 0339 07/02/20 0508  NA 138 140  138 138  K 3.8 3.5 3.8 4.0  CL 108 110 104 105  CO2 22 25 26 23   GLUCOSE 185* 139* 131* 187*  BUN 16 15 27* 30*  CREATININE 1.14* 1.05* 1.18* 1.18*  CALCIUM 8.4* 8.5* 8.7* 8.6*  MG  --  2.0  --   --   PHOS  --  4.0  --   --    GFR: Estimated Creatinine Clearance: 38.7 mL/min (A) (by C-G formula based on SCr of 1.18 mg/dL (H)). Liver Function Tests: Recent Labs  Lab 06/29/20 1222 06/30/20 0514 07/01/20 0339 07/02/20 0508  AST 21 16 17 18   ALT 11 10 15 11   ALKPHOS 107 100 105 90  BILITOT 0.8 0.6 0.8 0.7  PROT 6.8 6.4* 6.9 6.9  ALBUMIN 3.4* 3.2* 3.4* 3.3*   No results for input(s): LIPASE, AMYLASE in the last 168 hours. No results for input(s): AMMONIA in the last 168 hours. Coagulation Profile: No results for input(s): INR, PROTIME in the last 168 hours. Cardiac Enzymes: No results for input(s): CKTOTAL, CKMB, CKMBINDEX, TROPONINI in the last 168 hours. BNP (last 3 results) No results for input(s): PROBNP in the last 8760 hours. HbA1C: No results for input(s): HGBA1C in the last 72 hours. CBG: Recent Labs  Lab 07/02/20 1205 07/02/20 1602 07/02/20 2151 07/03/20 0745 07/03/20 1146  GLUCAP 145* 171* 221* 148* 175*   Lipid Profile: No results for input(s): CHOL, HDL, LDLCALC, TRIG, CHOLHDL, LDLDIRECT in the last 72 hours. Thyroid Function Tests: No results for input(s): TSH, T4TOTAL, FREET4, T3FREE, THYROIDAB in the last 72 hours. Anemia Panel: No results for input(s): VITAMINB12, FOLATE, FERRITIN, TIBC, IRON, RETICCTPCT in the last 72 hours. Sepsis Labs: No results for input(s): PROCALCITON, LATICACIDVEN in the last 168 hours.  Recent Results (from the past 240 hour(s))  Resp Panel by RT-PCR (Flu A&B, Covid) Nasopharyngeal Swab     Status: Abnormal   Collection Time: 06/29/20  1:51 PM   Specimen: Nasopharyngeal Swab; Nasopharyngeal(NP) swabs in vial transport medium  Result Value Ref Range Status   SARS Coronavirus 2 by RT PCR POSITIVE (A) NEGATIVE Final     Comment: RESULT CALLED TO, READ BACK BY AND VERIFIED WITH: Laverle Patter RN AT 4166 ON 06/29/20 SNG (NOTE) SARS-CoV-2 target nucleic acids are DETECTED.  The SARS-CoV-2 RNA is generally detectable in upper respiratory specimens during the acute phase of infection. Positive results are indicative of the presence of the identified virus, but do not rule out bacterial infection or co-infection with other pathogens not detected by the test. Clinical correlation with patient history and other diagnostic information is necessary to determine patient infection status. The expected result is Negative.  Fact Sheet for Patients: EntrepreneurPulse.com.au  Fact Sheet for Healthcare Providers: IncredibleEmployment.be  This test is not yet approved or cleared by the [place] FDA and  has been authorized for detection and/or diagnosis of SARS-CoV-2 by FDA under an Emergency Use Authorization (EUA).  This EUA will remain in effect (meaning this tes t can be used) for the duration of  the COVID-19 declaration under Section 564(b)(1) of the Act, 21 U.S.C. section 360bbb-3(b)(1), unless the authorization is terminated or revoked sooner.     Influenza A by PCR NEGATIVE NEGATIVE Final   Influenza B by PCR NEGATIVE NEGATIVE Final    Comment: (NOTE) The Xpert Xpress SARS-CoV-2/FLU/RSV plus assay is intended as an aid in the diagnosis of influenza from Nasopharyngeal swab specimens and should not be used as a sole basis for treatment. Nasal washings and aspirates are unacceptable for Xpert Xpress SARS-CoV-2/FLU/RSV testing.  Fact Sheet for Patients: EntrepreneurPulse.com.au  Fact Sheet for Healthcare Providers: IncredibleEmployment.be  This test is not yet approved or cleared by the [place] FDA and has been authorized for detection and/or diagnosis of SARS-CoV-2 by FDA under an Emergency Use Authorization (EUA).  This EUA will remain in effect (meaning this test can be used) for the duration of the COVID-19 declaration under Section 564(b)(1) of the Act, 21 U.S.C. section 360bbb-3(b)(1), unless the authorization is terminated or revoked.  Performed at [facility], [address] [place], [place] [address]   Resp Panel by RT-PCR (Flu A&B, Covid) Nasopharyngeal Swab     Status: None   Collection Time: 07/01/20 11:52 AM   Specimen: Nasopharyngeal Swab; Nasopharyngeal(NP) swabs in vial transport medium  Result Value Ref Range Status   SARS Coronavirus 2 by RT PCR NEGATIVE NEGATIVE Final    Comment: (NOTE) SARS-CoV-2 target nucleic acids are NOT DETECTED.  The SARS-CoV-2 RNA is generally detectable in upper respiratory specimens during the acute phase of infection. The lowest concentration of SARS-CoV-2 viral copies this assay can detect is 138 copies/mL. A negative result does not preclude SARS-Cov-2 infection and should not be used as the sole basis for treatment or other patient management decisions. A negative result may occur with  improper specimen collection/handling, submission of specimen other than nasopharyngeal swab, presence of viral mutation(s) within the areas targeted by this assay, and inadequate number of viral copies(<138 copies/mL). A negative result must be combined with clinical observations, patient history, and epidemiological information. The expected result is Negative.  Fact Sheet for Patients:  EntrepreneurPulse.com.au  Fact Sheet for Healthcare Providers:  IncredibleEmployment.be  This test is no t yet approved or cleared by the [place] FDA and  has been authorized for detection and/or diagnosis of SARS-CoV-2 by FDA under an Emergency Use Authorization (EUA). This EUA will remain  in effect (meaning this test can be used) for the duration of the COVID-19 declaration under Section 564(b)(1) of the Act,  21 U.S.C.section 360bbb-3(b)(1), unless the authorization is terminated  or revoked sooner.       Influenza A by PCR NEGATIVE NEGATIVE Final   Influenza B by PCR NEGATIVE NEGATIVE Final    Comment: (NOTE) The Xpert Xpress SARS-CoV-2/FLU/RSV plus assay is intended as an aid in the diagnosis of influenza from Nasopharyngeal swab specimens and should not be used as a sole basis for treatment. Nasal washings and aspirates are unacceptable for Xpert Xpress SARS-CoV-2/FLU/RSV testing.  Fact Sheet for Patients: EntrepreneurPulse.com.au  Fact Sheet for Healthcare Providers: IncredibleEmployment.be  This test is not yet approved or cleared by the [place] FDA and has been authorized for detection and/or diagnosis of SARS-CoV-2 by FDA under an Emergency Use Authorization (EUA). This EUA will remain in effect (meaning this test can be used) for the duration of the COVID-19 declaration under Section 564(b)(1) of the  Act, 21 U.S.C. section 360bbb-3(b)(1), unless the authorization is terminated or revoked.  Performed at [facility], [address] [place], [place] [address]          Radiology Studies: US Venous Img Lower Bilateral (DVT)  Result Date: 07/03/2020 CLINICAL DATA:  Elevated D-dimer.  Evaluate for DVT. EXAM: BILATERAL LOWER EXTREMITY VENOUS DOPPLER ULTRASOUND TECHNIQUE: Gray-scale sonography with graded compression, as well as color Doppler and duplex ultrasound were performed to evaluate the lower extremity deep venous systems from the level of the common femoral vein and including the common femoral, femoral, profunda femoral, popliteal and calf veins including the posterior tibial, peroneal and gastrocnemius veins when visible. The superficial great saphenous vein was also interrogated. Spectral Doppler was utilized to evaluate flow at rest and with distal augmentation maneuvers in the common femoral, femoral and popliteal  veins. COMPARISON:  None. FINDINGS: RIGHT LOWER EXTREMITY Common Femoral Vein: No evidence of thrombus. Normal compressibility, respiratory phasicity and response to augmentation. Saphenofemoral Junction: No evidence of thrombus. Normal compressibility and flow on color Doppler imaging. Profunda Femoral Vein: No evidence of thrombus. Normal compressibility and flow on color Doppler imaging. Femoral Vein: No evidence of thrombus. Normal compressibility, respiratory phasicity and response to augmentation. Popliteal Vein: No evidence of thrombus. Normal compressibility, respiratory phasicity and response to augmentation. Calf Veins: No evidence of thrombus. Normal compressibility and flow on color Doppler imaging. Superficial Great Saphenous Vein: No evidence of thrombus. Normal compressibility. Venous Reflux:  None. Other Findings:  None. LEFT LOWER EXTREMITY Common Femoral Vein: No evidence of thrombus. Normal compressibility, respiratory phasicity and response to augmentation. Saphenofemoral Junction: No evidence of thrombus. Normal compressibility and flow on color Doppler imaging. Profunda Femoral Vein: No evidence of thrombus. Normal compressibility and flow on color Doppler imaging. Femoral Vein: No evidence of thrombus. Normal compressibility, respiratory phasicity and response to augmentation. Popliteal Vein: No evidence of thrombus. Normal compressibility, respiratory phasicity and response to augmentation. Calf Veins: No evidence of thrombus. Normal compressibility and flow on color Doppler imaging. Superficial Great Saphenous Vein: No evidence of thrombus. Normal compressibility. Venous Reflux:  None. Other Findings:  None. IMPRESSION: No evidence of DVT within either lower extremity. Electronically Signed   By: Sandi Mariscal M.D.   On: 07/03/2020 08:03        Scheduled Meds: . clopidogrel  75 mg Oral Daily  . enoxaparin (LOVENOX) injection  40 mg Subcutaneous Q24H  . escitalopram  10 mg Oral Daily   . feeding supplement  237 mL Oral BID BM  . gabapentin  100 mg Oral TID  . insulin aspart  0-15 Units Subcutaneous TID WC  . multivitamin with minerals  1 tablet Oral Daily  . QUEtiapine  25 mg Oral QHS  . simvastatin  20 mg Oral Daily  . sodium chloride flush  3 mL Intravenous Q12H   Continuous Infusions: . sodium chloride       LOS: 4 days    Time spent: 27 minutes    Sharen Hones, MD [facility]   To contact the attending provider between 7A-7P or the covering provider during after hours 7P-7A, please log into the web site [email or web address] and access using universal [place] password for that web site. If you do not have the password, please call the hospital operator.  07/03/2020, 3:20 PM

## 2020-07-03 NOTE — Progress Notes (Signed)
Physical Therapy Treatment Patient Details Name: Erin Hamilton MRN: [id number] DOB: 08-Mar-1950 Today's Date: 07/03/2020    History of Present Illness Erin Hamilton is a 70 y.o. female with a history of dementia, failure to thrive, strokes HLD, HTN, T2DM, and covid-19 pneumonia March 28, 2020 who presented to the ED on 4/30 with worsening severe weakness, headache, and near falling. Work up included SARS-CoV-2 PCR which was positive (93 days after previous positive), CXR showing bibasilar opacity consistent with postinflammatory scarring without acute airspace disease. CT head showed a large remote right MCA infarct but no acute changes.    PT Comments    Pt was long sitting in bed upon arriving. She is alert but confused. Oriented to self but able to follow simple commands consistently throughout. Session greatly limited by multiple incontinence episodes.  She was able to exit L side of bed, stand, and ambulate to doorway of room and back to recliner on opposite side of room. Tolerated well but was fatigued afterwards. She was repositioned in recliner post session with RN tech in room, chair alarm set and call bell in reach.    Follow Up Recommendations  SNF     Equipment Recommendations  Other (comment) (defer to next level of care)       Precautions / Restrictions Precautions Precautions: Fall Restrictions Weight Bearing Restrictions: No    Mobility  Bed Mobility Overal bed mobility: Needs Assistance Bed Mobility: Supine to Sit     Supine to sit: Min assist;Mod assist     General bed mobility comments: increased tie to perform. pt struggles with inititation of movements but once moving does well. did require use of bedrails with HOB elevated    Transfers Overall transfer level: Needs assistance Equipment used: Rolling walker (2 wheeled) Transfers: Sit to/from Stand Sit to Stand: Min assist         General transfer comment: pt stood from slightly elevated bed height  to RW with min assist. does require step by step cues for safety and sequencing  Ambulation/Gait Ambulation/Gait assistance: Min assist Gait Distance (Feet): 25 Feet Assistive device: Rolling walker (2 wheeled) Gait Pattern/deviations: Step-through pattern Gait velocity: decreased   General Gait Details: Pt was able to ambulate ~ 25 ft with min assist. pt has 2 episodes of incontinence of urine while ambulating. greatly limited distance.     Balance Overall balance assessment: Needs assistance Sitting-balance support: Bilateral upper extremity supported Sitting balance-[name] Scale: Fair     Standing balance support: Bilateral upper extremity supported;During functional activity Standing balance-[name] Scale: Poor Standing balance comment: poor safety awareness and poor standing balance make pt high fall risk       Cognition Arousal/Alertness: Awake/alert Behavior During Therapy: Flat affect;WFL for tasks assessed/performed Overall Cognitive Status: History of cognitive impairments - at baseline      General Comments: Pt is alert but only oriented to self. Was able to follow simple one step commands consistently throughout.             Pertinent Vitals/Pain Pain Assessment: No/denies pain           PT Goals (current goals can now be found in the care plan section) Acute Rehab PT Goals Patient Stated Goal: none stated Progress towards PT goals: Progressing toward goals    Frequency    Min 2X/week      PT Plan Current plan remains appropriate       AM-PAC PT "6 Clicks" Mobility   Outcome Measure  Help  needed turning from your back to your side while in a flat bed without using bedrails?: A Lot Help needed moving from lying on your back to sitting on the side of a flat bed without using bedrails?: A Lot Help needed moving to and from a bed to a chair (including a wheelchair)?: A Lot Help needed standing up from a chair using your arms (e.g., wheelchair or  bedside chair)?: A Lot Help needed to walk in hospital room?: A Lot Help needed climbing 3-5 steps with a railing? : A Lot 6 Click Score: 12    End of Session Equipment Utilized During Treatment: Gait belt Activity Tolerance: Patient tolerated treatment well Patient left: in chair;with call bell/phone within reach;with chair alarm set;with nursing/sitter in room (RN tech in room at conclusion of session) Nurse Communication: Mobility status PT Visit Diagnosis: Unsteadiness on feet (R26.81);Muscle weakness (generalized) (M62.81);Difficulty in walking, not elsewhere classified (R26.2);Hemiplegia and hemiparesis Hemiplegia - Right/Left: Left Hemiplegia - dominant/non-dominant: Non-dominant Hemiplegia - caused by: Cerebral infarction     Time: 0258-5277 PT Time Calculation (min) (ACUTE ONLY): 23 min  Charges:  $Gait Training: 8-22 mins $Therapeutic Activity: 8-22 mins                     Julaine Fusi PTA 07/03/20, 1:31 PM

## 2020-07-03 NOTE — Evaluation (Signed)
Occupational Therapy Evaluation Patient Details Name: Erin Hamilton MRN: [id number] DOB: 09-Mar-1950 Today's Date: 07/03/2020    History of Present Illness Erin Hamilton is a 70 y.o. female with a history of dementia, failure to thrive, strokes HLD, HTN, T2DM, and covid-19 pneumonia March 28, 2020 who presented to the ED on 4/30 with worsening severe weakness, headache, and near falling. Work up included SARS-CoV-2 PCR which was positive (93 days after previous positive), CXR showing bibasilar opacity consistent with postinflammatory scarring without acute airspace disease. CT head showed a large remote right MCA infarct but no acute changes.   Clinical Impression   Patient presenting with decreased I in self care, balance, functional mobility/transfers, endurance, and safety awareness. Patient reports living at home alone with use of RW for functional transfers PTA. No family present to confirm baseline. OT asking pt about residual L hemiplegia and pt stating, " I wasn't aware I had a stroke". Pt very lethargic and needing increased time and mod multimodal cuing. Pt standing with min A and needing mod A for repositioning. Pt needing max A to scoot back into recliner chair requesting to remain in wheelchair.  Patient will benefit from acute OT to increase overall independence in the areas of ADLs, functional mobility, and safety awareness in order to safely discharge to next venue of care.    Follow Up Recommendations  SNF;Supervision/Assistance - 24 hour    Equipment Recommendations  Other (comment) (defer to next venue of care)       Precautions / Restrictions Precautions Precautions: Fall Restrictions Weight Bearing Restrictions: No      Mobility Bed Mobility Overal bed mobility: Needs Assistance Bed Mobility: Supine to Sit     Supine to sit: Min assist;Mod assist     General bed mobility comments: seated in recliner chair upon entering the room    Transfers Overall  transfer level: Needs assistance Equipment used: None Transfers: Sit to/from Stand Sit to Stand: Mod assist         General transfer comment: Pt standing and needing assistance to sit and scoot self back into recliner chair for safety    Balance Overall balance assessment: Needs assistance Sitting-balance support: Bilateral upper extremity supported Sitting balance-[name] Scale: Fair     Standing balance support: Bilateral upper extremity supported;During functional activity Standing balance-[name] Scale: Poor Standing balance comment: poor safety awareness and poor standing balance make pt high fall risk                           ADL either performed or assessed with clinical judgement   ADL Overall ADL's : Needs assistance/impaired     Grooming: Wash/dry hands;Wash/dry face;Sitting               Lower Body Dressing: Moderate assistance;Maximal assistance;Sit to/from stand                       Vision Patient Visual Report: No change from baseline              Pertinent Vitals/Pain Pain Assessment: No/denies pain     Hand Dominance Right   Extremity/Trunk Assessment Upper Extremity Assessment Upper Extremity Assessment: Generalized weakness;LUE deficits/detail LUE Deficits / Details: Has difficulty elevating L UE above 90 degrees. States due to heaviness. Hand in fisted position, however some motion noted. Grossly 2/5; reports she is R hand dominant. When asked about prior stroke pt reports, " I wasn't aware I  had one".   Lower Extremity Assessment Lower Extremity Assessment: Defer to PT evaluation       Communication Communication Communication: No difficulties   Cognition Arousal/Alertness: Lethargic Behavior During Therapy: Flat affect;WFL for tasks assessed/performed Overall Cognitive Status: History of cognitive impairments - at baseline                                 General Comments: Pt oriented to self only.  Pt follows 1 step commands with increased time and min - mod multimodal cuing.              Home Living Family/patient expects to be discharged to:: Private residence Living Arrangements: Alone Available Help at Discharge: Family;Available 24 hours/day Type of Home: House Home Access: Stairs to enter [employer] of Steps: 4 Entrance Stairs-Rails: Right;Left Home Layout: One level               Home Equipment: Walker - 2 wheels   Additional Comments: Pt is poor historian, history receivied via previous admission chart.      Prior Functioning/Environment Level of Independence: Needs assistance        Comments: history obtained from previous encounter. Per patient, she nods head in agreement to RW stating she uses at home. No family present to confirm PLOF        OT Problem List: Decreased strength;Impaired balance (sitting and/or standing);Decreased cognition;Decreased knowledge of precautions;Decreased safety awareness;Cardiopulmonary status limiting activity;Decreased activity tolerance;Decreased knowledge of use of DME or AE;Impaired sensation;Impaired UE functional use;Decreased coordination      OT Treatment/Interventions: Self-care/ADL training;Manual therapy;Therapeutic exercise;Patient/family education;Neuromuscular education;Balance training;Energy conservation;Therapeutic activities;Cognitive remediation/compensation    OT Goals(Current goals can be found in the care plan section) Acute Rehab OT Goals Patient Stated Goal: none stated OT Goal Formulation: Patient unable to participate in goal setting Time For Goal Achievement: 07/17/20 Potential to Achieve Goals: Good ADL Goals Pt Will Perform Grooming: with modified independence Pt Will Perform Lower Body Dressing: with min assist Pt Will Transfer to Toilet: with min guard assist Pt Will Perform Toileting - Clothing Manipulation and hygiene: with min guard assist  OT Frequency: Min 2X/week    Barriers to D/C: Decreased caregiver support             AM-PAC OT "6 Clicks" Daily Activity     Outcome Measure Help from another person eating meals?: A Little Help from another person taking care of personal grooming?: A Little Help from another person toileting, which includes using toliet, bedpan, or urinal?: A Lot Help from another person bathing (including washing, rinsing, drying)?: A Lot Help from another person to put on and taking off regular upper body clothing?: A Little Help from another person to put on and taking off regular lower body clothing?: A Lot 6 Click Score: 15   End of Session Nurse Communication: Mobility status  Activity Tolerance: Patient limited by lethargy;Patient limited by fatigue Patient left: in chair;with call bell/phone within reach;with chair alarm set  OT Visit Diagnosis: Unsteadiness on feet (R26.81);Muscle weakness (generalized) (M62.81)                Time: 1040-1100 OT Time Calculation (min): 20 min Charges:  OT General Charges $OT Visit: 1 Visit OT Evaluation $OT Eval Moderate Complexity: 1 Mod OT Treatments $Self Care/Home Management : 8-22 mins  Darleen Crocker, MS, OTR/L , CBIS ascom [phone]  07/03/20, 2:36 PM

## 2020-07-04 DIAGNOSIS — R531 Weakness: Secondary | ICD-10-CM | POA: Diagnosis not present

## 2020-07-04 DIAGNOSIS — U071 COVID-19: Secondary | ICD-10-CM | POA: Diagnosis not present

## 2020-07-04 DIAGNOSIS — E119 Type 2 diabetes mellitus without complications: Secondary | ICD-10-CM | POA: Diagnosis not present

## 2020-07-04 LAB — RESP PANEL BY RT-PCR (FLU A&B, COVID) ARPGX2
Influenza A by PCR: NEGATIVE
Influenza B by PCR: NEGATIVE
SARS Coronavirus 2 by RT PCR: NEGATIVE

## 2020-07-04 LAB — GLUCOSE, CAPILLARY
Glucose-Capillary: 137 mg/dL — ABNORMAL HIGH (ref 70–99)
Glucose-Capillary: 132 mg/dL — ABNORMAL HIGH (ref 70–99)

## 2020-07-04 MED ORDER — ENSURE ENLIVE PO LIQD: 237.0000 mL | Freq: Two times a day (BID) | ORAL | 12 refills | Status: AC

## 2020-07-04 NOTE — Discharge Summary (Signed)
Physician Discharge Summary  Patient ID: Erin Hamilton MRN: [id number] DOB/AGE: 08/25/1950 70 y.o.  Admit date: 06/29/2020 Discharge date: 07/04/2020  Admission Diagnoses:  Discharge Diagnoses:  Principal Problem:   Generalized weakness Active Problems:   Diabetes mellitus without complication (HCC)   Cognitive dysfunction due to acute cerebrovascular accident (CVA) (Newborn)   Hemiparesis affecting left side as late effect of cerebrovascular accident (Erin Hamilton)   COVID-19 virus detected   Discharged Condition: good  Hospital Course:  Erin Hamilton a 70 y.o.femalewith a history of dementia, failure to thrive, strokes HLD, HTN, T2DM, and covid-19 pneumonia March 28, 2020 who presented to the ED on 4/30 with worsening severe weakness, headache, and near falling. Work up included SARS-CoV-2 PCR which was positive (93 days after previous positive), CXR showing bibasilar opacity consistent with postinflammatory scarring without acute airspace disease. CT head showed a large remote right MCA infarct but no acute changes.The patient was afebrile without leukocytosis. Admitted for weakness with PT now recommending SNF.   #1.  COVID infection. Patient does not have any hypoxemia, no respiratory symptoms and No need for treatment.  2.  Generalized weakness. Failure to thrive. Patient has been seen by PT/OT. Continue Ensure, PT/OT, patient is medically stable to be transferred to nursing home.  3.  History of CVA with left hemiparesis. Dysarthria. Vascular dementia. At baseline.  #4.  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 Chronic kidney disease stage IIIa. Hemoglobin A1c 8.1. Resume home medicines.     Consults: None  Significant Diagnostic Studies:   Treatments: Symptomatic treatment  Discharge Exam: Blood pressure (!) 158/81, pulse 71, temperature 97.8 F (36.6 C), resp. rate 20, height 5\' 2"  (1.575 m), weight 63 kg, SpO2 98 %. General  appearance: alert, cooperative and Oriented to place and person. Resp: clear to auscultation bilaterally Cardio: regular rate and rhythm, S1, S2 normal, no murmur, click, rub or gallop GI: soft, non-tender; bowel sounds normal; no masses,  no organomegaly Extremities: extremities normal, atraumatic, no cyanosis or edema  Disposition: Discharge disposition: 03-Skilled Nursing Facility       Discharge Instructions    Diet - low sodium heart healthy   Complete by: As directed    Increase activity slowly   Complete by: As directed      Allergies as of 07/04/2020      Reactions   Oysters [shellfish Allergy] Anaphylaxis   Penicillins Anaphylaxis   Has patient had a PCN reaction causing immediate rash, facial/tongue/throat swelling, SOB or lightheadedness with hypotension: Yes Has patient had a PCN reaction causing severe rash involving mucus membranes or skin necrosis: No Has patient had a PCN reaction that required hospitalization No Has patient had a PCN reaction occurring within the last 10 years: No If all of the above answers are "NO", then may proceed with Cephalosporin use.   Latex Rash      Medication List    STOP taking these medications   azithromycin 500 MG tablet Commonly known as: ZITHROMAX   doxycycline 100 MG tablet Commonly known as: VIBRA-TABS   sulfamethoxazole-trimethoprim 800-160 MG tablet Commonly known as: BACTRIM DS     TAKE these medications   acetaminophen 325 MG tablet Commonly known as: TYLENOL Take 1 tablet (325 mg total) by mouth every 6 (six) hours as needed for mild pain, fever or headache (fever >/= 101).   clopidogrel 75 MG tablet Commonly known as: PLAVIX TAKE 1 TABLET EVERY DAY   escitalopram 10 MG tablet Commonly known as: LEXAPRO Take  10 mg by mouth daily.   feeding supplement Liqd Take 237 mLs by mouth 2 (two) times daily between meals.   gabapentin 100 MG capsule Commonly known as: NEURONTIN Take 1 capsule (100 mg total) by  mouth 3 (three) times daily.   glucose blood test strip 1 each by Other route daily. Use as instructed   insulin aspart 100 UNIT/ML injection Commonly known as: novoLOG Inject 0-9 Units into the skin 3 (three) times daily with meals. Sliding scale insulin Less than 70 initiate hypoglycemia protocol 70-120  0 units 120-150 1 unit 151-200 2 units 201-250 3 units 251-300 5 units 301-350 7 units 351-400 9 units Greater than 400 call MD   lisinopril 10 MG tablet Commonly known as: ZESTRIL Take 1 tablet (10 mg total) by mouth daily.   metFORMIN 500 MG tablet Commonly known as: Glucophage Take 0.5 tablets (250 mg total) by mouth 2 (two) times daily with a meal. What changed: when to take this   QUEtiapine 25 MG tablet Commonly known as: SEROQUEL Take 1 tablet (25 mg total) by mouth at bedtime.   simvastatin 20 MG tablet Commonly known as: ZOCOR Take 20 mg by mouth daily.   traZODone 50 MG tablet Commonly known as: DESYREL Take 25 mg by mouth daily.   vitamin B-12 1000 MCG tablet Commonly known as: CYANOCOBALAMIN Take 100 mcg by mouth daily.       Contact information for follow-up providers    Kirk Ruths, MD Follow up in 1 week(s).   Specialty: Internal Medicine Contact information: [facility] [address] [place] [address] [phone]            Contact information for after-discharge care    Destination    [facility] .   Service: Skilled Nursing Contact information: [address] [place] [phone]                  Signed: Sharen Hones 07/04/2020, 10:45 AM

## 2020-07-04 NOTE — TOC Progression Note (Signed)
Transition of Care [facility]) - Progression Note    Patient Details  Name: Erin Hamilton MRN: [id number] Date of Birth: [birth year]  [facility]) CM/SW [place], RN Phone Number: 07/04/2020, 10:46 AM  Clinical Narrative:    Aida Puffer from [facility] approving SNF request. Auth id [id number]. NRD 07/08/20.   Expected Discharge Plan: Skilled Nursing Facility Barriers to Discharge: Continued Medical Work up  Expected Discharge Plan and Services Expected Discharge Plan: [place]   Discharge Planning Services: CM Consult Post Acute Care Choice: [place] Living arrangements for the past 2 months: Single Family Home Expected Discharge Date: 07/04/20               DME Arranged:  (Plan to go to SNF)         HH Arranged:  (Plan to go to SNF)           Social Determinants of Health (SDOH) Interventions    Readmission Risk Interventions Readmission Risk Prevention Plan 07/01/2020  Transportation Screening Complete  PCP or Specialist Appt within 3-5 Days Complete  HRI or Home Care Consult Complete  Social Work Consult for [place] Planning/Counseling Not Complete  SW consult not completed comments RNCM assigned  Palliative Care Screening Not Applicable  Medication Review Press photographer) Complete  Some recent data might be hidden

## 2020-07-04 NOTE — Care Management Important Message (Signed)
Important Message  Patient Details  Name: Erin Hamilton MRN: [id number] Date of Birth: [birth year]   Medicare Important Message Given:  Yes     Juliann Pulse A Garner Dullea 07/04/2020, 11:33 AM

## 2020-07-04 NOTE — Progress Notes (Signed)
Patient discharged with [facility] transportation to [place] at [place]. Nurse Wilburn Cornelia received report on Ms.Kanzler at 1400 and verbalized understanding of the patient's history, plan of care, follow up care, and medication management. Patients daughter contacted, but no answer. Patient did not have any belongings with her. IV removed prior to discharge.

## 2020-07-04 NOTE — Discharge Instructions (Signed)

## 2020-07-04 NOTE — TOC Transition Note (Addendum)
Transition of Care [facility]) - CM/SW Discharge Note   Patient Details  Name: Erin Hamilton MRN: [id number] Date of Birth: December 21, 1950  [facility]) CM/SW Contact:  Pete Pelt, RN Phone Number: 07/04/2020, 9:31 AM   Clinical Narrative:  AddendumNoreene Larsson for SNF [phone] as per [facility] Patient and family chose [facility] as first choice.  Spoke with RIcki, he will inform of decision at 10am today.  Patient will need a redo COVID  Test prior to transfer to any facility, care team aware.        Barriers to Discharge: Continued Medical Work up   Patient Goals and CMS Choice  Patient states their goals for this hospitalization and ongoing recovery are:: Patient's daughter stated, "I want to know what is causing this" CMS Medicare.gov Compare Post Acute Care list provided to:: Patient Represenative (must comment) (Patient's daughter, [name]) Choice offered to / list presented to : [place]  Discharge Placement                       Discharge Plan and Services   Discharge Planning Services: CM Consult Post Acute Care Choice: [place]          DME Arranged:  (Plan to go to SNF)         HH Arranged:  (Plan to go to SNF)          Social Determinants of Health (SDOH) Interventions     Readmission Risk Interventions Readmission Risk Prevention Plan 07/01/2020  Transportation Screening Complete  PCP or Specialist Appt within 3-5 Days Complete  HRI or Home Care Consult Complete  Social Work Consult for [place] Planning/Counseling Not Complete  SW consult not completed comments RNCM assigned  Palliative Care Screening Not Applicable  Medication Review Press photographer) Complete  Some recent data might be hidden

## 2020-07-19 ENCOUNTER — Non-Acute Institutional Stay: Payer: Medicare Other | Admitting: Primary Care

## 2020-07-19 ENCOUNTER — Other Ambulatory Visit: Payer: Self-pay

## 2020-07-19 DIAGNOSIS — I639 Cerebral infarction, unspecified: Secondary | ICD-10-CM

## 2020-07-19 DIAGNOSIS — R4189 Other symptoms and signs involving cognitive functions and awareness: Secondary | ICD-10-CM

## 2020-07-19 DIAGNOSIS — Z515 Encounter for palliative care: Secondary | ICD-10-CM

## 2020-07-19 DIAGNOSIS — R531 Weakness: Secondary | ICD-10-CM

## 2020-07-19 NOTE — Progress Notes (Signed)
[facility] Consult Note Telephone: [phone]  Fax: [phone]    Date of encounter: 07/19/20 PATIENT NAME: Erin Hamilton [place] [address]   [phone] (home)  DOB: [birth year] MRN: [id number] PRIMARY CARE PROVIDER:    Kirk Ruths, MD,  [place] [address] [phone]  REFERRING PROVIDER:   Dr Merlene Pulling RESPONSIBLE PARTY:    Contact Information    Name Relation Home Work Mobile   [facility] Daughter [phone]  [phone]   Alzina, Golda Daughter   [phone]   Dianna, Ewald [phone]  [phone]      I met face to face with patient in [facility] facility. Palliative Care was asked to follow this patient by consultation request of  Dr. Briant Sites [place]  to address advance care planning and complex medical decision making. This is the initial visit.                                     ASSESSMENT AND PLAN / RECOMMENDATIONS:   Advance Care Planning/Goals of Care: Goals include to maximize quality of life and symptom management. Our advance care planning conversation included a discussion about:     The value and importance of advance care planning   Experiences with loved ones who have been seriously ill or have died   Exploration of personal, cultural or spiritual beliefs that might influence medical decisions   Identification and preparation of a healthcare agent - Daughter  CODE STATUS:  FULL Code, with MOST ( not signed by MD), need to review with family   Discussed upcoming disposition of pt home. Appeal pending, no determination as of 5 pm Friday. Daughter states no resources for in home ATC care. We discussed LTC as an option. Dgt to discuss with SNF SW. Patient is not at this point hospice ready but I will continue to assess. Discussed home care and what this would entail. Discussed intermittent nature of home health  services  and home hospice care as well. Daughter to consider care venue options.  Symptom Management/Plan:  Weight/intake; Stable weight. Taking 75-100% of meals. Needs meal set up.  Weakness; Daughter reports increased weakness over several months. Has had Covid infection in January which has lead to this debility. Could have a long covid process.   Mobility: Up with PT but has not been able to ambulate (I).  Was living alone prior to recent illness. Patient has more mobility to recoup for (I) living. Current appeal for ongoing PT.   Follow up Palliative Care Visit: Palliative care will continue to follow for complex medical decision making, advance care planning, and clarification of goals. Return 2-4 weeks or prn.  I spent 45 minutes providing this consultation. More than 50% of the time in this consultation was spent in counseling and care coordination.  PPS: 40%  HOSPICE ELIGIBILITY/DIAGNOSIS: TBD  Chief Complaint: debility  HISTORY OF PRESENT ILLNESS:  Erin Hamilton is a 70 y.o. year old female  with  history of dementia, failure to thrive, strokes HLD, HTN, T2DM, and covid-19 pneumonia March 28, 2020 , severe weakness, headache, and near falling.Family endorses gradually getting weaker and not returning to her baseline. Currently at [facility] for rehab following weakness and ED presentation 3 weeks ago.  History obtained from review of EMR, discussion with primary team, and interview with  family, facility staff/caregiver and/or Ms. Das.  I reviewed available labs, medications, imaging, studies and related documents from the EMR.  Records reviewed and summarized above.   ROS/staff  General: NAD ENMT: denies dysphagia, not speaking today Pulmonary: denies cough, denies increased SOB Abdomen: endorses good appetite, endorses constipation, endorses incontinence of bowel GU: denies dysuria, endorses incontinence of urine MSK:  Endorses  weakness,  no falls reported Skin: denies  rashes or wounds Neurological: denies pain, denies insomnia Psych: Endorses positive mood Heme/lymph/immuno: denies bruises, abnormal bleeding  Physical Exam: Current and past weights: 140 lbs, stable Constitutional: NAD General: frail appearing EYES: anicteric sclera, lids intact, no discharge  ENMT: intact hearing, oral mucous membranes moist, not speaking today CV:  no LE edema Pulmonary:no increased work of breathing, no cough, room air Abdomen: intake 100%, no ascites YVO:[id number] sarcopenia, moves all extremities, nonambulatory Skin: warm and dry, no rashes or wounds on visible skin Neuro:  ++generalized weakness,  ++ cognitive impairment Psych: non-anxious affect, A and O x 1 Hem/lymph/immuno: no widespread bruising   CURRENT PROBLEM LIST:   Patient Active Problem List   Diagnosis Date Noted  . Generalized weakness 06/29/2020  . COVID-19 virus detected 06/29/2020  . Positive blood culture 03/30/2020  . Acute metabolic encephalopathy 28/63/8177  . Pneumonia due to COVID-19 virus 03/29/2020  . Acute hypoxemic respiratory failure due to COVID-19 ([place]) 03/28/2020  . Wheelchair bound 12/13/2019  . UTI (urinary tract infection) 11/06/2017  . Hemiparesis affecting left side as late effect of cerebrovascular accident ([place]) 06/24/2016  . Bilateral carotid artery stenosis   . H/O carotid endarterectomy   . Cognitive dysfunction due to acute cerebrovascular accident (CVA) ([place]) 06/23/2016  . H/O: stroke 11/19/2015  . Health care maintenance 11/19/2015  . Disorder of arteries and arterioles, unspecified ([place]) 11/19/2015  . Diabetes mellitus without complication ([place]) 11/65/7903  . Essential hypertension, benign 10/10/2014  . Back pain with left-sided sciatica 10/10/2014  . Acute anxiety 10/09/2014  . Stroke ([place]) 10/09/2014  . Hyperlipidemia 10/09/2014  . Depression 10/09/2014  . Hypertensive CKD (chronic kidney disease) 10/09/2014  . Type 2 diabetes mellitus with  hyperglycemia, without long-term current use of insulin ([place]) 09/22/2009  . ACUTE SINUSITIS, UNSPECIFIED 09/22/2009   PAST MEDICAL HISTORY:  Active Ambulatory Problems    Diagnosis Date Noted  . Type 2 diabetes mellitus with hyperglycemia, without long-term current use of insulin ([place]) 09/22/2009  . ACUTE SINUSITIS, UNSPECIFIED 09/22/2009  . Acute anxiety 10/09/2014  . Stroke ([place]) 10/09/2014  . Hyperlipidemia 10/09/2014  . Depression 10/09/2014  . Hypertensive CKD (chronic kidney disease) 10/09/2014  . Diabetes mellitus without complication ([place]) 83/33/8329  . Essential hypertension, benign 10/10/2014  . Back pain with left-sided sciatica 10/10/2014  . Cognitive dysfunction due to acute cerebrovascular accident (CVA) ([place]) 06/23/2016  . Hemiparesis affecting left side as late effect of cerebrovascular accident ([place]) 06/24/2016  . Bilateral carotid artery stenosis   . H/O carotid endarterectomy   . UTI (urinary tract infection) 11/06/2017  . H/O: stroke 11/19/2015  . Health care maintenance 11/19/2015  . Acute hypoxemic respiratory failure due to COVID-19 ([place]) 03/28/2020  . Pneumonia due to COVID-19 virus 03/29/2020  . Positive blood culture 03/30/2020  . Acute metabolic encephalopathy 19/16/6060  . Disorder of arteries and arterioles, unspecified ([place]) 11/19/2015  . Wheelchair bound 12/13/2019  . Generalized weakness 06/29/2020  . COVID-19 virus detected 06/29/2020   Resolved Ambulatory Problems    Diagnosis Date Noted  . HYPERTENSION 09/22/2009   Past Medical History:  Diagnosis Date  . Anxiety   . CVA (cerebral infarction)   . Hypertension   . Squamous cell carcinoma of skin 07/05/2019   SOCIAL HX:  Social History   Tobacco Use  . Smoking status: Former Research scientist (life sciences)  . Smokeless tobacco: Never Used  Substance Use Topics  . Alcohol use: No    Alcohol/week: 0.0 standard drinks   FAMILY HX:  Family History  Problem Relation Age of Onset  . Stroke Father   . Heart  attack Sister   . Breast cancer Sister   . Cancer Son        throat and lung  . Cancer Sister        gum      ALLERGIES:  Allergies  Allergen Reactions  . Oysters [Shellfish Allergy] Anaphylaxis  . Penicillins Anaphylaxis    Has patient had a PCN reaction causing immediate rash, facial/tongue/throat swelling, SOB or lightheadedness with hypotension: Yes Has patient had a PCN reaction causing severe rash involving mucus membranes or skin necrosis: No Has patient had a PCN reaction that required hospitalization No Has patient had a PCN reaction occurring within the last 10 years: No If all of the above answers are "NO", then may proceed with Cephalosporin use.   . Latex Rash     PERTINENT MEDICATIONS:  Outpatient Encounter Medications as of 07/19/2020  Medication Sig  . acetaminophen (TYLENOL) 325 MG tablet Take 1 tablet (325 mg total) by mouth every 6 (six) hours as needed for mild pain, fever or headache (fever >/= 101). (Patient not taking: No sig reported)  . clopidogrel (PLAVIX) 75 MG tablet TAKE 1 TABLET EVERY DAY (Patient taking differently: Take 75 mg by mouth daily.)  . escitalopram (LEXAPRO) 10 MG tablet Take 10 mg by mouth daily.  . feeding supplement (ENSURE ENLIVE / ENSURE PLUS) LIQD Take 237 mLs by mouth 2 (two) times daily between meals.  . gabapentin (NEURONTIN) 100 MG capsule Take 1 capsule (100 mg total) by mouth 3 (three) times daily.  Mar[place] Kitchen glucose blood test strip 1 each by Other route daily. Use as instructed (Patient not taking: Reported on 05/14/2020)  . insulin aspart (NOVOLOG) 100 UNIT/ML injection Inject 0-9 Units into the skin 3 (three) times daily with meals. Sliding scale insulin Less than 70 initiate hypoglycemia protocol 70-120  0 units 120-150 1 unit 151-200 2 units 201-250 3 units 251-300 5 units 301-350 7 units 351-400 9 units Greater than 400 call MD (Patient not taking: No sig reported)  . lisinopril (ZESTRIL) 10 MG tablet Take 1 tablet (10 mg total) by  mouth daily.  . metFORMIN (GLUCOPHAGE) 500 MG tablet Take 0.5 tablets (250 mg total) by mouth 2 (two) times daily with a meal. (Patient taking differently: Take 250 mg by mouth daily with breakfast.)  . QUEtiapine (SEROQUEL) 25 MG tablet Take 1 tablet (25 mg total) by mouth at bedtime. (Patient not taking: No sig reported)  . simvastatin (ZOCOR) 20 MG tablet Take 20 mg by mouth daily.  . traZODone (DESYREL) 50 MG tablet Take 25 mg by mouth daily. (Patient not taking: Reported on 06/30/2020)  . vitamin B-12 (CYANOCOBALAMIN) 1000 MCG tablet Take 100 mcg by mouth daily.   No facility-administered encounter medications on file as of 07/19/2020.    Thank you for the opportunity to participate in the care of Ms. Dayton.  The palliative care team will continue to follow. Please call our office at [phone] if we can be of additional assistance.   Jonette Eva  Tamala Julian, NP , DNP, MPH, AGPCNP-BC, ACHPN  COVID-19 PATIENT SCREENING TOOL Asked and negative response unless otherwise noted:   Have you had symptoms of covid, tested positive or been in contact with someone with symptoms/positive test in the past 5-10 days?

## 2020-07-23 ENCOUNTER — Telehealth: Payer: Self-pay | Admitting: Nurse Practitioner

## 2020-07-23 NOTE — Telephone Encounter (Signed)
Spoke with patient's daughter [name] to schedule an in-home Palliative visit and she stated that the patient has not been discharged from [facility] yet, they are still waiting for the facility to get the DME delivered to her home before she is discharged, daughter is unsure when this will be.  She also stated that [facility] has already called her as well wanting to know if they could come out to see patient.  Told daughter that I would f/u with her in the morning.

## 2020-07-23 NOTE — Telephone Encounter (Signed)
Spoke with patient's daughter, [name], and she was in agreement with scheduling a Palliative f/u visit (post discharge from [facility]).  I have scheduled an In-home visit for 07/31/20 @ 11 AM.

## 2020-07-31 ENCOUNTER — Encounter: Payer: Self-pay | Admitting: Nurse Practitioner

## 2020-07-31 ENCOUNTER — Other Ambulatory Visit: Payer: Medicare Other | Admitting: Nurse Practitioner

## 2020-07-31 ENCOUNTER — Other Ambulatory Visit: Payer: Self-pay

## 2020-07-31 DIAGNOSIS — Z515 Encounter for palliative care: Secondary | ICD-10-CM

## 2020-07-31 DIAGNOSIS — R5381 Other malaise: Secondary | ICD-10-CM

## 2020-07-31 NOTE — Progress Notes (Signed)
[facility] Palliative Care Consult Note Telephone: [phone]  Fax: [phone]    Date of encounter: 07/31/20 PATIENT NAME: Erin Hamilton [id number]   [phone] (home)  DOB: 1950-03-09 MRN: [id number] PRIMARY CARE PROVIDER:    Kirk Ruths, MD,  [place] [address] [phone]  RESPONSIBLE PARTY:    Contact Information    Name Relation Home Work Coronita Daughter [phone]  [phone]   Billijo, Dilling Daughter   [phone]   Krystol, Rocco [phone]  [phone]     I met face to face with patient and daugher, Erin Hamilton in home. Palliative Care was asked to follow this patient by consultation request of  Kirk Ruths, MD to address advance care planning and complex medical decision making. This is the initial visit.   ASSESSMENT AND PLAN / RECOMMENDATIONS:   Advance Care Planning/Goals of Care: Goals include to maximize quality of life and symptom management. Our advance care planning conversation included a discussion about:     The value and importance of advance care planning   Experiences with loved ones who have been seriously ill or have died   Exploration of personal, cultural or spiritual beliefs that might influence medical decisions   Exploration of goals of care in the event of a sudden injury or illness   Identification and preparation of a healthcare agent   Review and updating or creation of an  advance directive document .  Decision not to resuscitate or to de-escalate disease focused treatments due to poor prognosis.  CODE STATUS: DNR  Symptom Management/Plan: 1. ACP; discussed code status; made DNR with golden rod form completed; placed in vynca; wishes are for comfort care at home. We talked about Hospice services with wishes to have Hospice Physicians determine eligibility. If found eligible wishes are to  proceed with Hospice.  2. Debility secondary to dementia; continue to decline; progressive to end of life with clinical presentation.   Left arm 24 cm Left leg 40 cm 07/02/2020 albumin 3.3/total protein 6.9 but has continued to only eat few bites several times a day over the last several weeks.   3. Goals of Care: Goals include to maximize quality of life and symptom management. Our advance care planning conversation included a discussion about:     The value and importance of advance care planning   Exploration of personal, cultural or spiritual beliefs that might influence medical decisions   Exploration of goals of care in the event of a sudden injury or illness   Identification and preparation of a healthcare agent   Review and updating or creation of an advance directive document.  4. Palliative care encounter; Palliative care encounter; Palliative medicine team will continue to support patient, patient's family, and medical team. Visit consisted of counseling and education dealing with the complex and emotionally intense issues of symptom management and palliative care in the setting of serious and potentially life-threatening illness  I spent 75 minutes providing this consultation. More than 50% of the time in this consultation was spent in counseling and care coordination.  PPS: low 30%  HOSPICE ELIGIBILITY/DIAGNOSIS: TBD  Chief Complaint: Initial palliative consult for complex medical decision making  HISTORY OF PRESENT ILLNESS:  Erin Hamilton is a 70 y.o. 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 Hospitalization 03/28/2020 to 04/09/2020 for difficulty walking, frequent falls with workup significant for acute respiratory failure with hypoxemia which resolved secondary to multifocal COVID PNA with superimposed bacterial infection; debility/GTT with  UTI; +blood cultures with staph auricularis likely contaminated. Acute metabolic encepalopathy improved. CT head obtained on 03/28/2020 showed stable remote infarcts including right MCA territory infarct. Ms. Nanni was d/c to STR at [facility] to return for re-hospitalization 06/29/2020 to 07/04/2020 for generalize weakness for covid virus detected, positive 93 days after previous positive. She was stabilized, send to [facility] for STR then d/c home where Ms. Yankowski continue to decline. I called Erin Dy Ms Scalese's daughter to confirm in person palliative care visit and covid screening which was negative. Christelle Kidibu NP student and I arrived at Ms Marsalis home greeted at the door by Erin Hamilton, her daughter.We talked to Erin Hamilton prior to seeing Ms Lenny Pastel, as Ms Freundlich is currently sleeping. We talked about past medical history, chronic disease progression including dementia, recent hospitalizations. We talked about Ms Demeo's functional level prior to hospitalization she was able to ambulate. Last hospitalization Ms Brossman returned home after a short term rehab at [facility] where she has not been able to ambulate, requires to be turned, position herself, bathed, dress and remains incontinent bowel and bladder. Erin Hamilton endorses Mr. Vitrano continues to remain in bed as she is unable to get her up. Erin Hamilton endorses Ms Credeur does require to be fed and appetite does remain declined. Not eating much, only a few bites at a meal for last few weeks.. Documented weight 140 though appears to weigh less.  We talked about weight loss with muscle wasting. We talked about caregiver stress, fatigue and burnout with coping strategies. We talked about life review as Ms Vallie divorced, having  two daughters. Erin Hamilton who lives with her being her primary caregiver with a second daughter who is not involved. We talked about medical goals of care including aggressive versus conservative versus comfort care period we talked about  overall decline and ability with progression towards and part of Ms Pavon life. We talked about advanced directives, Erin Hamilton endorses she is healthcare private attorney. We talked about code status including full code versus DNR with scenarios. Erin Hamilton endorses she would not want for her to have CPR nor would she for herself. [employer] DNR form completed and will play some vynca. We talked about Hospice benefit under Medicare program. We talked about what Hospice would provide, eligibility and how the program works. Erin Hamilton in agreement to have Hospice Physicians review case for eligibility.We talked about review about role of palliative care and plan of care period therapeutic listening, emotional support provided. Questions answered to satisfaction.  History obtained from review of EMR, discussion with Daughter Erin Hamilton and Ms. Boal.  I reviewed available labs, medications, imaging, studies and related documents from the EMR.  Records reviewed and summarized above.   ROS Full 14 system review of systems performed and negative with exception of: as per HPI.  Physical Exam: Constitutional: NAD General: chronically ill, debilitated female staring, making eye contact, non-verbal EYES:  lids intact ENMT:  oral mucous membranes moist CV: S1S2, RRR Pulmonary: poor air movement; decrease bases, no increased work of breathing, room air Abdomen: normo-active BS + 4 quadrants, soft and non tender MSK: muscle wasting;  Skin: warm and dry Neuro:  + generalized weakness,  Functional quadriplegic + cognitive impairment Psych: staring, flat affect,  CURRENT PROBLEM LIST:  Patient Active Problem List   Diagnosis Date Noted  .  Generalized weakness 06/29/2020  . COVID-19 virus detected 06/29/2020  . Positive blood culture 03/30/2020  . Acute metabolic encephalopathy 99/24/2683  . Pneumonia due to COVID-19 virus 03/29/2020  . Acute hypoxemic respiratory failure due to COVID-19 ([place]) 03/28/2020  .  Wheelchair bound 12/13/2019  . UTI (urinary tract infection) 11/06/2017  . Hemiparesis affecting left side as late effect of cerebrovascular accident ([place]) 06/24/2016  . Bilateral carotid artery stenosis   . H/O carotid endarterectomy   . Cognitive dysfunction due to acute cerebrovascular accident (CVA) ([place]) 06/23/2016  . H/O: stroke 11/19/2015  . Health care maintenance 11/19/2015  . Disorder of arteries and arterioles, unspecified ([place]) 11/19/2015  . Diabetes mellitus without complication ([place]) 41/96/2229  . Essential hypertension, benign 10/10/2014  . Back pain with left-sided sciatica 10/10/2014  . Acute anxiety 10/09/2014  . Stroke ([place]) 10/09/2014  . Hyperlipidemia 10/09/2014  . Depression 10/09/2014  . Hypertensive CKD (chronic kidney disease) 10/09/2014  . Type 2 diabetes mellitus with hyperglycemia, without long-term current use of insulin ([place]) 09/22/2009  . ACUTE SINUSITIS, UNSPECIFIED 09/22/2009   PAST MEDICAL HISTORY:  Active Ambulatory Problems    Diagnosis Date Noted  . Type 2 diabetes mellitus with hyperglycemia, without long-term current use of insulin ([place]) 09/22/2009  . ACUTE SINUSITIS, UNSPECIFIED 09/22/2009  . Acute anxiety 10/09/2014  . Stroke ([place]) 10/09/2014  . Hyperlipidemia 10/09/2014  . Depression 10/09/2014  . Hypertensive CKD (chronic kidney disease) 10/09/2014  . Diabetes mellitus without complication ([place]) 79/89/2119  . Essential hypertension, benign 10/10/2014  . Back pain with left-sided sciatica 10/10/2014  . Cognitive dysfunction due to acute cerebrovascular accident (CVA) ([place]) 06/23/2016  . Hemiparesis affecting left side as late effect of cerebrovascular accident ([place]) 06/24/2016  . Bilateral carotid artery stenosis   . H/O carotid endarterectomy   . UTI (urinary tract infection) 11/06/2017  . H/O: stroke 11/19/2015  . Health care maintenance 11/19/2015  . Acute hypoxemic respiratory failure due to COVID-19 ([place]) 03/28/2020  . Pneumonia  due to COVID-19 virus 03/29/2020  . Positive blood culture 03/30/2020  . Acute metabolic encephalopathy 41/74/0814  . Disorder of arteries and arterioles, unspecified ([place]) 11/19/2015  . Wheelchair bound 12/13/2019  . Generalized weakness 06/29/2020  . COVID-19 virus detected 06/29/2020   Resolved Ambulatory Problems    Diagnosis Date Noted  . HYPERTENSION 09/22/2009   Past Medical History:  Diagnosis Date  . Anxiety   . CVA (cerebral infarction)   . Hypertension   . Squamous cell carcinoma of skin 07/05/2019   SOCIAL HX:  Social History   Tobacco Use  . Smoking status: Former Research scientist (life sciences)  . Smokeless tobacco: Never Used  Substance Use Topics  . Alcohol use: No    Alcohol/week: 0.0 standard drinks   FAMILY HX:  Family History  Problem Relation Age of Onset  . Stroke Father   . Heart attack Sister   . Breast cancer Sister   . Cancer Son        throat and lung  . Cancer Sister        gum  Reviewed  ALLERGIES:  Allergies  Allergen Reactions  . Oysters [Shellfish Allergy] Anaphylaxis  . Penicillins Anaphylaxis    Has patient had a PCN reaction causing immediate rash, facial/tongue/throat swelling, SOB or lightheadedness with hypotension: Yes Has patient had a PCN reaction causing severe rash involving mucus membranes or skin necrosis: No Has patient had a PCN reaction that required hospitalization No Has patient had a PCN reaction occurring within the last 10 years:  No If all of the above answers are "NO", then may proceed with Cephalosporin use.   . Latex Rash     PERTINENT MEDICATIONS:  Outpatient Encounter Medications as of 07/31/2020  Medication Sig  . acetaminophen (TYLENOL) 325 MG tablet Take 1 tablet (325 mg total) by mouth every 6 (six) hours as needed for mild pain, fever or headache (fever >/= 101). (Patient not taking: No sig reported)  . clopidogrel (PLAVIX) 75 MG tablet TAKE 1 TABLET EVERY DAY (Patient taking differently: Take 75 mg by mouth daily.)  .  escitalopram (LEXAPRO) 10 MG tablet Take 10 mg by mouth daily.  . feeding supplement (ENSURE ENLIVE / ENSURE PLUS) LIQD Take 237 mLs by mouth 2 (two) times daily between meals.  . gabapentin (NEURONTIN) 100 MG capsule Take 1 capsule (100 mg total) by mouth 3 (three) times daily.  Marland Kitchen glucose blood test strip 1 each by Other route daily. Use as instructed (Patient not taking: Reported on 05/14/2020)  . insulin aspart (NOVOLOG) 100 UNIT/ML injection Inject 0-9 Units into the skin 3 (three) times daily with meals. Sliding scale insulin Less than 70 initiate hypoglycemia protocol 70-120  0 units 120-150 1 unit 151-200 2 units 201-250 3 units 251-300 5 units 301-350 7 units 351-400 9 units Greater than 400 call MD (Patient not taking: No sig reported)  . lisinopril (ZESTRIL) 10 MG tablet Take 1 tablet (10 mg total) by mouth daily.  . metFORMIN (GLUCOPHAGE) 500 MG tablet Take 0.5 tablets (250 mg total) by mouth 2 (two) times daily with a meal. (Patient taking differently: Take 250 mg by mouth daily with breakfast.)  . QUEtiapine (SEROQUEL) 25 MG tablet Take 1 tablet (25 mg total) by mouth at bedtime. (Patient not taking: No sig reported)  . simvastatin (ZOCOR) 20 MG tablet Take 20 mg by mouth daily.  . traZODone (DESYREL) 50 MG tablet Take 25 mg by mouth daily. (Patient not taking: Reported on 06/30/2020)  . vitamin B-12 (CYANOCOBALAMIN) 1000 MCG tablet Take 100 mcg by mouth daily.   No facility-administered encounter medications on file as of 07/31/2020.  Questions and concerns were addressed. The patient/family was encouraged to call with questions and/or concerns. My business card was provided. Provided general support and encouragement, no other unmet needs identified  Thank you for the opportunity to participate in the care of Ms. Guilford.  The palliative care team will continue to follow. Please call our office at [phone] if we can be of additional assistance.   This chart was dictated using voice  recognition software. Despite best efforts to proofread, errors can occur which can change the documentation meaning.  Bayleigh Loflin Z Juwaun Inskeep, NP , MSN, ACHPN  COVID-19 PATIENT SCREENING TOOL Asked and negative response unless otherwise noted:   Have you had symptoms of covid, tested positive or been in contact with someone with symptoms/positive test in the past 5-10 days? NO

## 2020-08-13 ENCOUNTER — Ambulatory Visit: Payer: Medicare Other | Admitting: Dermatology

## 2021-01-30 DEATH — deceased

## 2021-05-13 ENCOUNTER — Encounter (INDEPENDENT_AMBULATORY_CARE_PROVIDER_SITE_OTHER): Payer: Medicare Other

## 2021-05-13 ENCOUNTER — Ambulatory Visit (INDEPENDENT_AMBULATORY_CARE_PROVIDER_SITE_OTHER): Payer: Medicare Other | Admitting: Vascular Surgery

## 2021-06-02 ENCOUNTER — Other Ambulatory Visit (INDEPENDENT_AMBULATORY_CARE_PROVIDER_SITE_OTHER): Payer: Self-pay | Admitting: Nurse Practitioner

## 2021-06-02 DIAGNOSIS — I6523 Occlusion and stenosis of bilateral carotid arteries: Secondary | ICD-10-CM

## 2021-06-03 ENCOUNTER — Ambulatory Visit (INDEPENDENT_AMBULATORY_CARE_PROVIDER_SITE_OTHER): Payer: Medicare Other | Admitting: Vascular Surgery

## 2021-06-03 ENCOUNTER — Encounter (INDEPENDENT_AMBULATORY_CARE_PROVIDER_SITE_OTHER): Payer: Medicare Other

## 2021-12-29 ENCOUNTER — Encounter (INDEPENDENT_AMBULATORY_CARE_PROVIDER_SITE_OTHER): Payer: Self-pay
